# Patient Record
Sex: Female | Born: 1935 | Race: Asian
Health system: Midwestern US, Community
[De-identification: ages and names within clinical notes are randomized; demographics above are authoritative.]

## PROBLEM LIST (undated history)

## (undated) DIAGNOSIS — I77810 Thoracic aortic ectasia: Secondary | ICD-10-CM

## (undated) DIAGNOSIS — E7800 Pure hypercholesterolemia, unspecified: Secondary | ICD-10-CM

## (undated) DIAGNOSIS — I639 Cerebral infarction, unspecified: Secondary | ICD-10-CM

## (undated) DIAGNOSIS — D759 Disease of blood and blood-forming organs, unspecified: Secondary | ICD-10-CM

## (undated) DIAGNOSIS — C9590 Leukemia, unspecified not having achieved remission: Secondary | ICD-10-CM

## (undated) DIAGNOSIS — K2270 Barrett's esophagus without dysplasia: Secondary | ICD-10-CM

## (undated) DIAGNOSIS — Z87440 Personal history of urinary (tract) infections: Secondary | ICD-10-CM

## (undated) DIAGNOSIS — I10 Essential (primary) hypertension: Secondary | ICD-10-CM

## (undated) DIAGNOSIS — K219 Gastro-esophageal reflux disease without esophagitis: Secondary | ICD-10-CM

## (undated) DIAGNOSIS — I714 Abdominal aortic aneurysm, without rupture: Secondary | ICD-10-CM

## (undated) DIAGNOSIS — M1990 Unspecified osteoarthritis, unspecified site: Secondary | ICD-10-CM

## (undated) HISTORY — DX: Cerebral infarction, unspecified: I63.9

## (undated) HISTORY — DX: Gastro-esophageal reflux disease without esophagitis: K21.9

## (undated) HISTORY — DX: Leukemia, unspecified not having achieved remission: C95.90

## (undated) HISTORY — DX: Abdominal aortic aneurysm, without rupture: I71.4

## (undated) HISTORY — DX: Pure hypercholesterolemia, unspecified: E78.00

## (undated) HISTORY — PX: CATARACT EXTRACTION: SUR2

## (undated) HISTORY — DX: Thoracic aortic ectasia: I77.810

## (undated) HISTORY — DX: Unspecified osteoarthritis, unspecified site: M19.90

## (undated) HISTORY — PX: OTHER SURGICAL HISTORY: SHX169

## (undated) HISTORY — DX: Essential (primary) hypertension: I10

## (undated) HISTORY — DX: Barrett's esophagus without dysplasia: K22.70

---

## 2002-02-26 ENCOUNTER — Other Ambulatory Visit: Admission: RE | Admit: 2002-02-26 | Discharge: 2002-02-26 | Payer: Self-pay | Admitting: Obstetrics and Gynecology

## 2002-06-22 ENCOUNTER — Ambulatory Visit (HOSPITAL_COMMUNITY): Admission: RE | Admit: 2002-06-22 | Discharge: 2002-06-23 | Payer: Self-pay | Admitting: Ophthalmology

## 2002-06-22 ENCOUNTER — Encounter: Payer: Self-pay | Admitting: Ophthalmology

## 2002-07-01 ENCOUNTER — Encounter: Admission: RE | Admit: 2002-07-01 | Discharge: 2002-07-01 | Payer: Self-pay | Admitting: Cardiovascular Disease

## 2002-07-01 ENCOUNTER — Encounter: Payer: Self-pay | Admitting: Cardiovascular Disease

## 2002-11-29 ENCOUNTER — Ambulatory Visit (HOSPITAL_COMMUNITY): Admission: RE | Admit: 2002-11-29 | Discharge: 2002-11-29 | Payer: Self-pay | Admitting: Cardiovascular Disease

## 2002-11-29 IMAGING — US US ABDOMEN COMPLETE
1 series · 14 of 25 positions shown · non-contrast
Comparison: none

CLINICAL DATA: 68 year-old with abdominal pain, difficulty swallowing.
 ULTRASOUND ABDOMEN COMPLETE
 There is no evidence of gallstones or gallbladder wall thickening.  There is no evidence of biliary ductal dilatation.  The liver is within normal limits in echogenicity, and no focal parenchymal lesions are identified.  The visualized portion of the pancreas is unremarkable in appearance.
 The kidneys are within normal limits in size and echogenicity and there is no evidence of masses or hydronephrosis.  There is no evidence of splenomegaly, ascites, or abdominal aortic aneurysm.
 IMPRESSION
 Negative abdominal ultrasound.

[Series 1: unknown · 0.35mm/px · 14 of 51 slices shown]
[im 1/51]
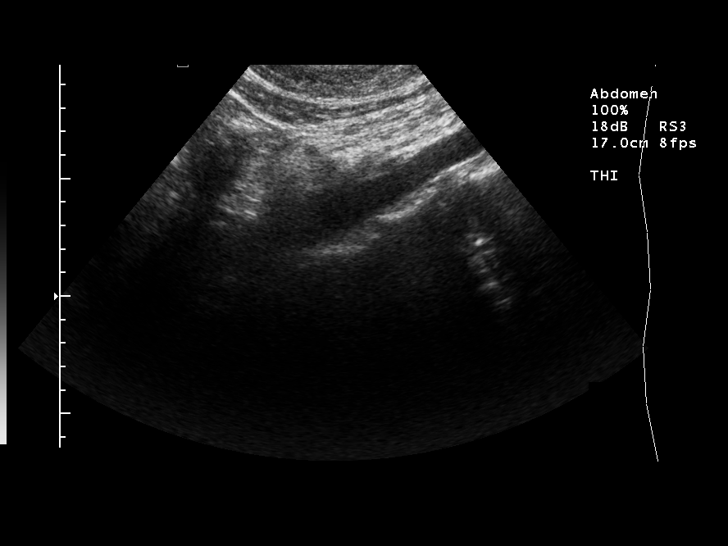
[im 5/51]
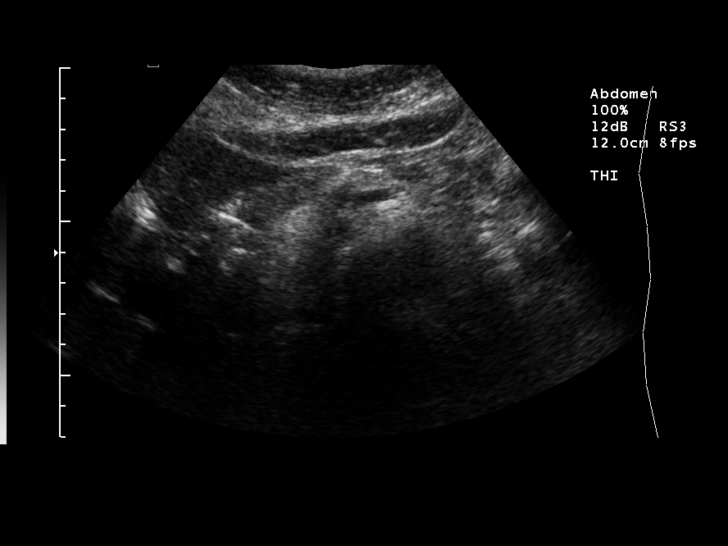
[im 9/51]
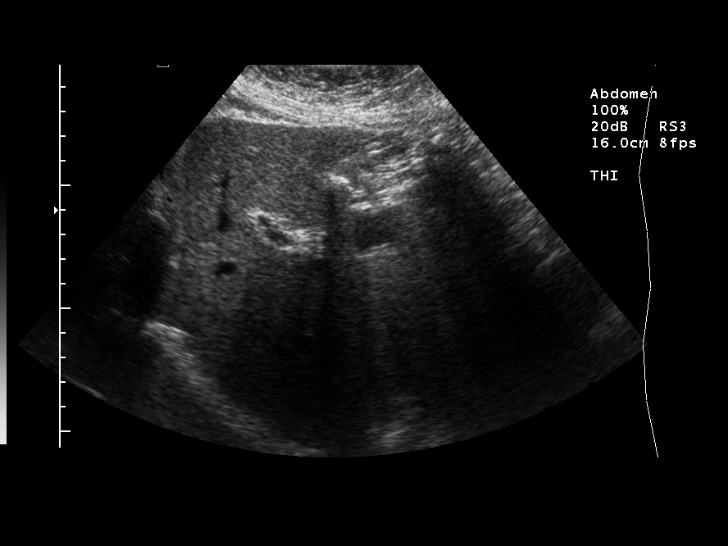
[im 13/51]
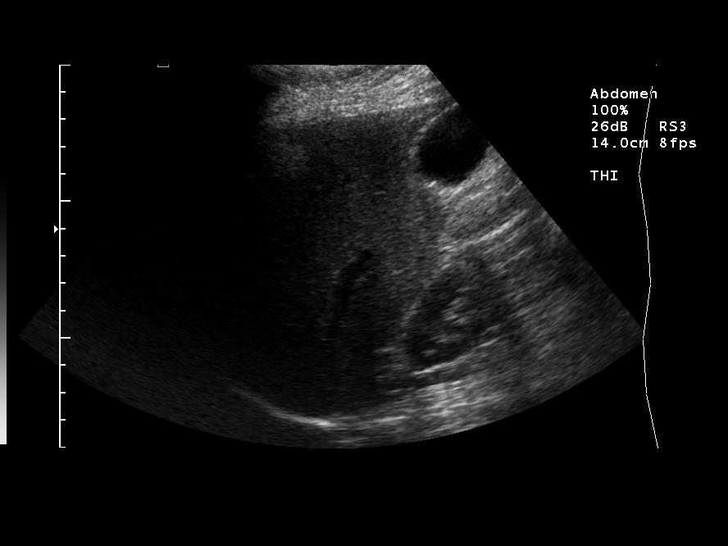
[im 17/51]
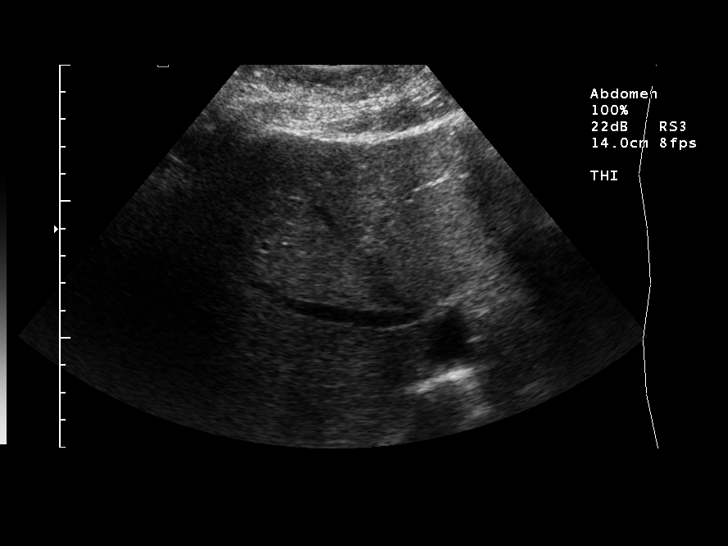
[im 19/51]
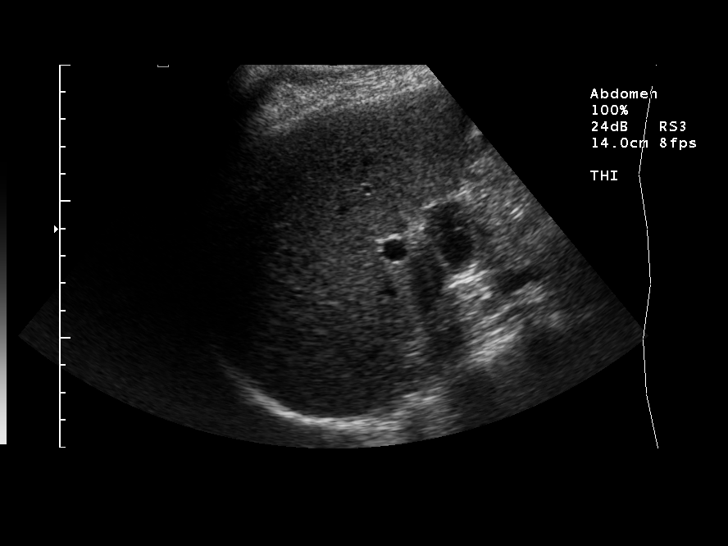
[im 23/51]
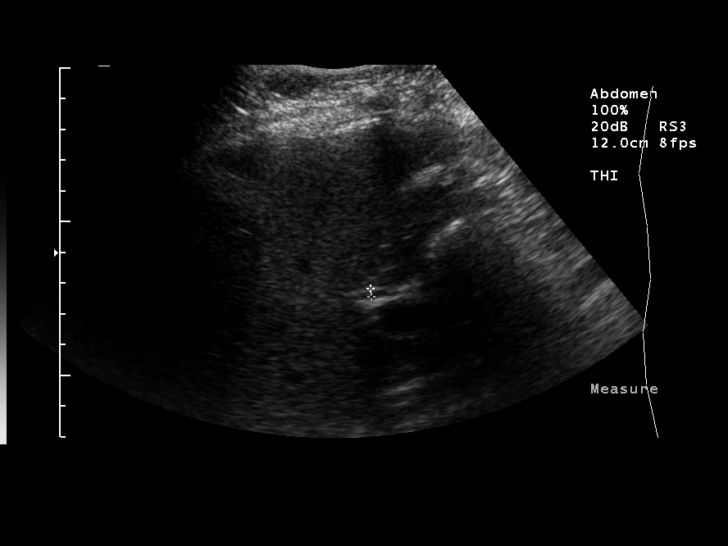
[im 28/51]
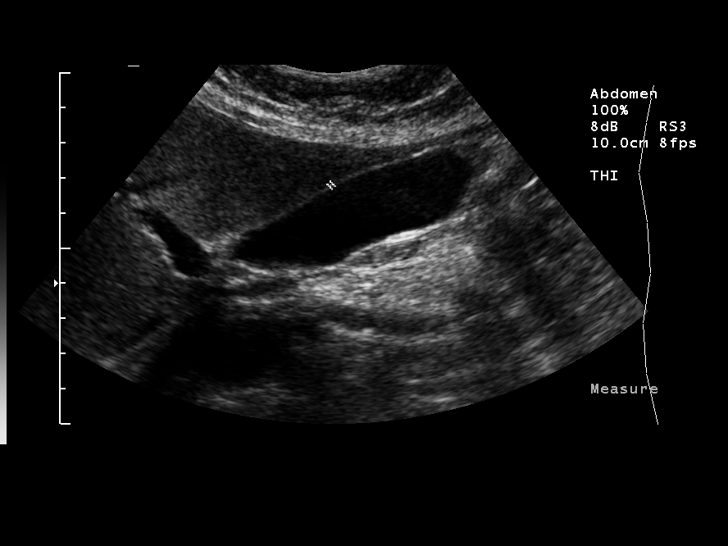
[im 32/51]
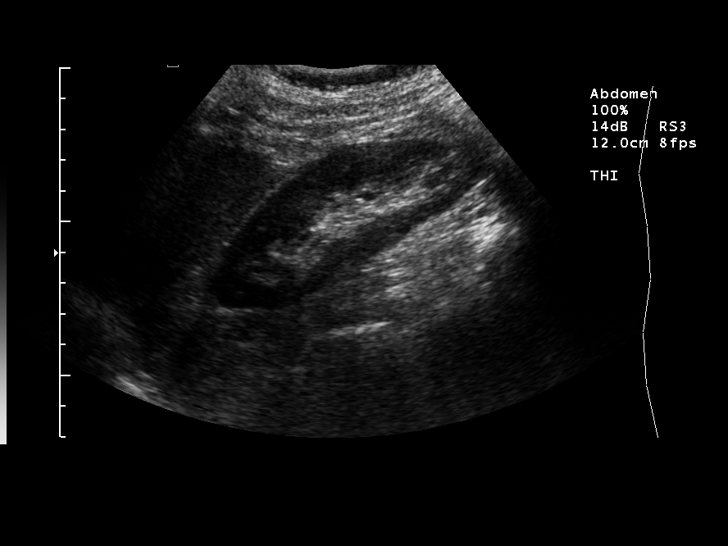
[im 34/51]
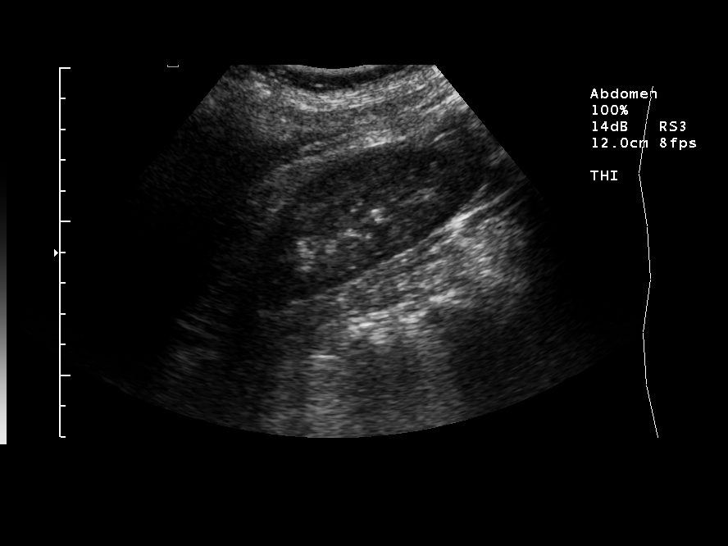
[im 38/51]
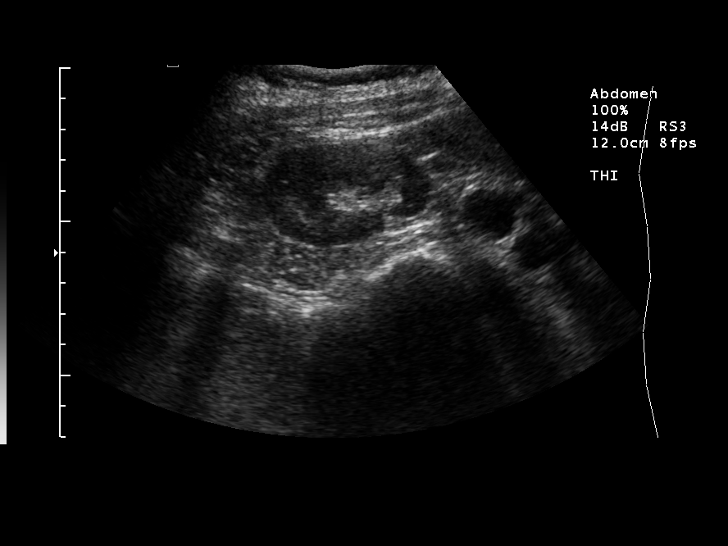
[im 42/51]
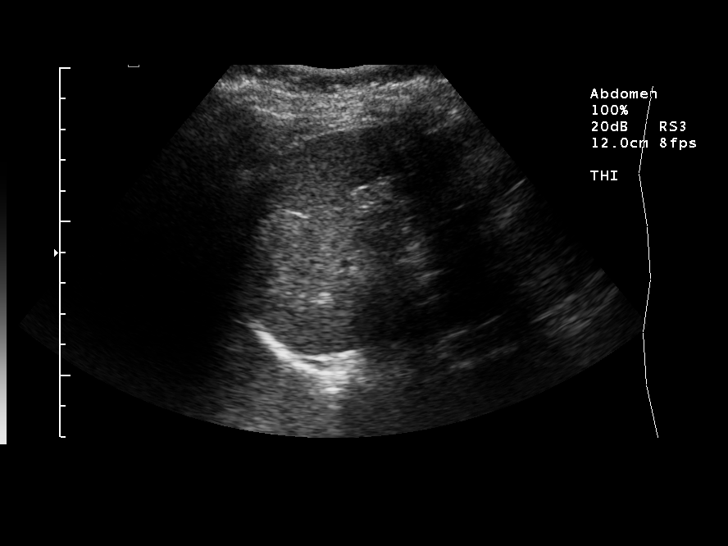
[im 46/51]
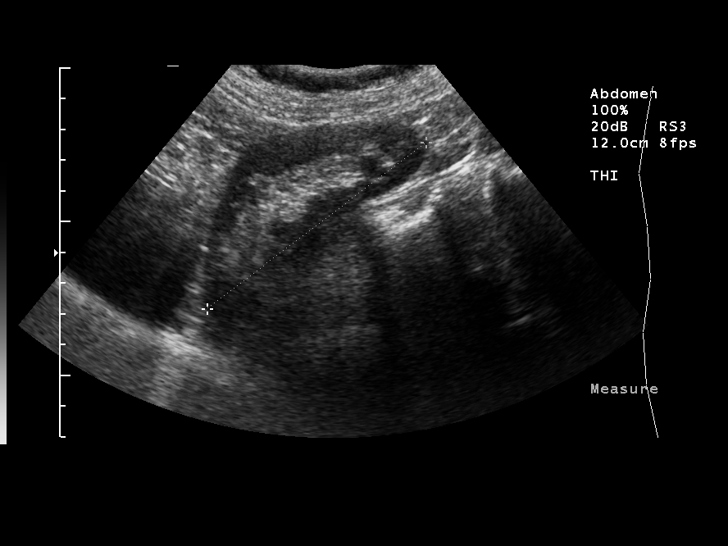
[im 51/51]
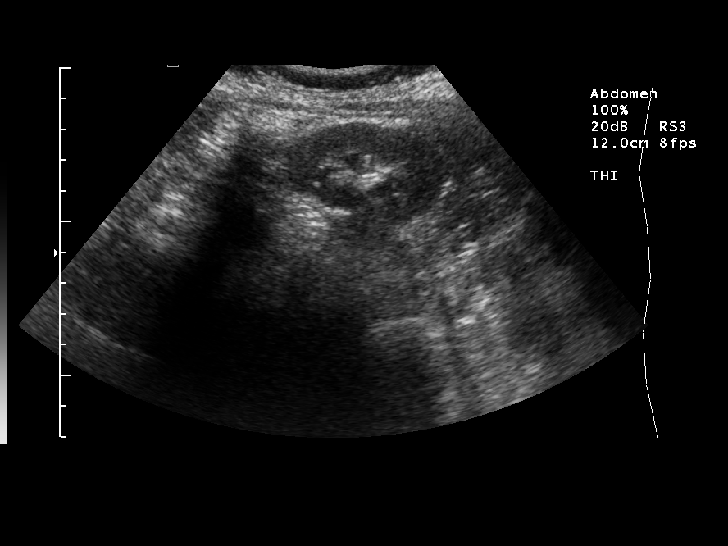

[14 of 25 positions shown; findings below may reference images not displayed]

## 2003-01-11 ENCOUNTER — Ambulatory Visit (HOSPITAL_COMMUNITY): Admission: RE | Admit: 2003-01-11 | Discharge: 2003-01-11 | Payer: Self-pay | Admitting: Obstetrics and Gynecology

## 2003-05-02 ENCOUNTER — Other Ambulatory Visit: Admission: RE | Admit: 2003-05-02 | Discharge: 2003-05-02 | Payer: Self-pay | Admitting: Obstetrics and Gynecology

## 2003-11-30 ENCOUNTER — Encounter: Admission: RE | Admit: 2003-11-30 | Discharge: 2003-11-30 | Payer: Self-pay | Admitting: Cardiovascular Disease

## 2003-11-30 IMAGING — CR DG KNEE 1-2V*R*
2 series · 2 of 2 positions shown · non-contrast
Comparison: none

CLINICAL DATA: Bilateral knee pain for several years, greater on the left.  No known injury.  Left knee effusions drained in the past.  
 TWO VIEW RIGHT KNEE: 
 AP and lateral views of the right knee demonstrate moderate medial joint space narrowing and associated spur formation.  Also noted is mild lateral spur formation and mild to moderate posterior patellar spur formation.  A vacuum phenomenon is seen within the medial joint space.  No effusion is seen.  Diffuse osteopenia is noted.

[view not recorded (1 of 2)]
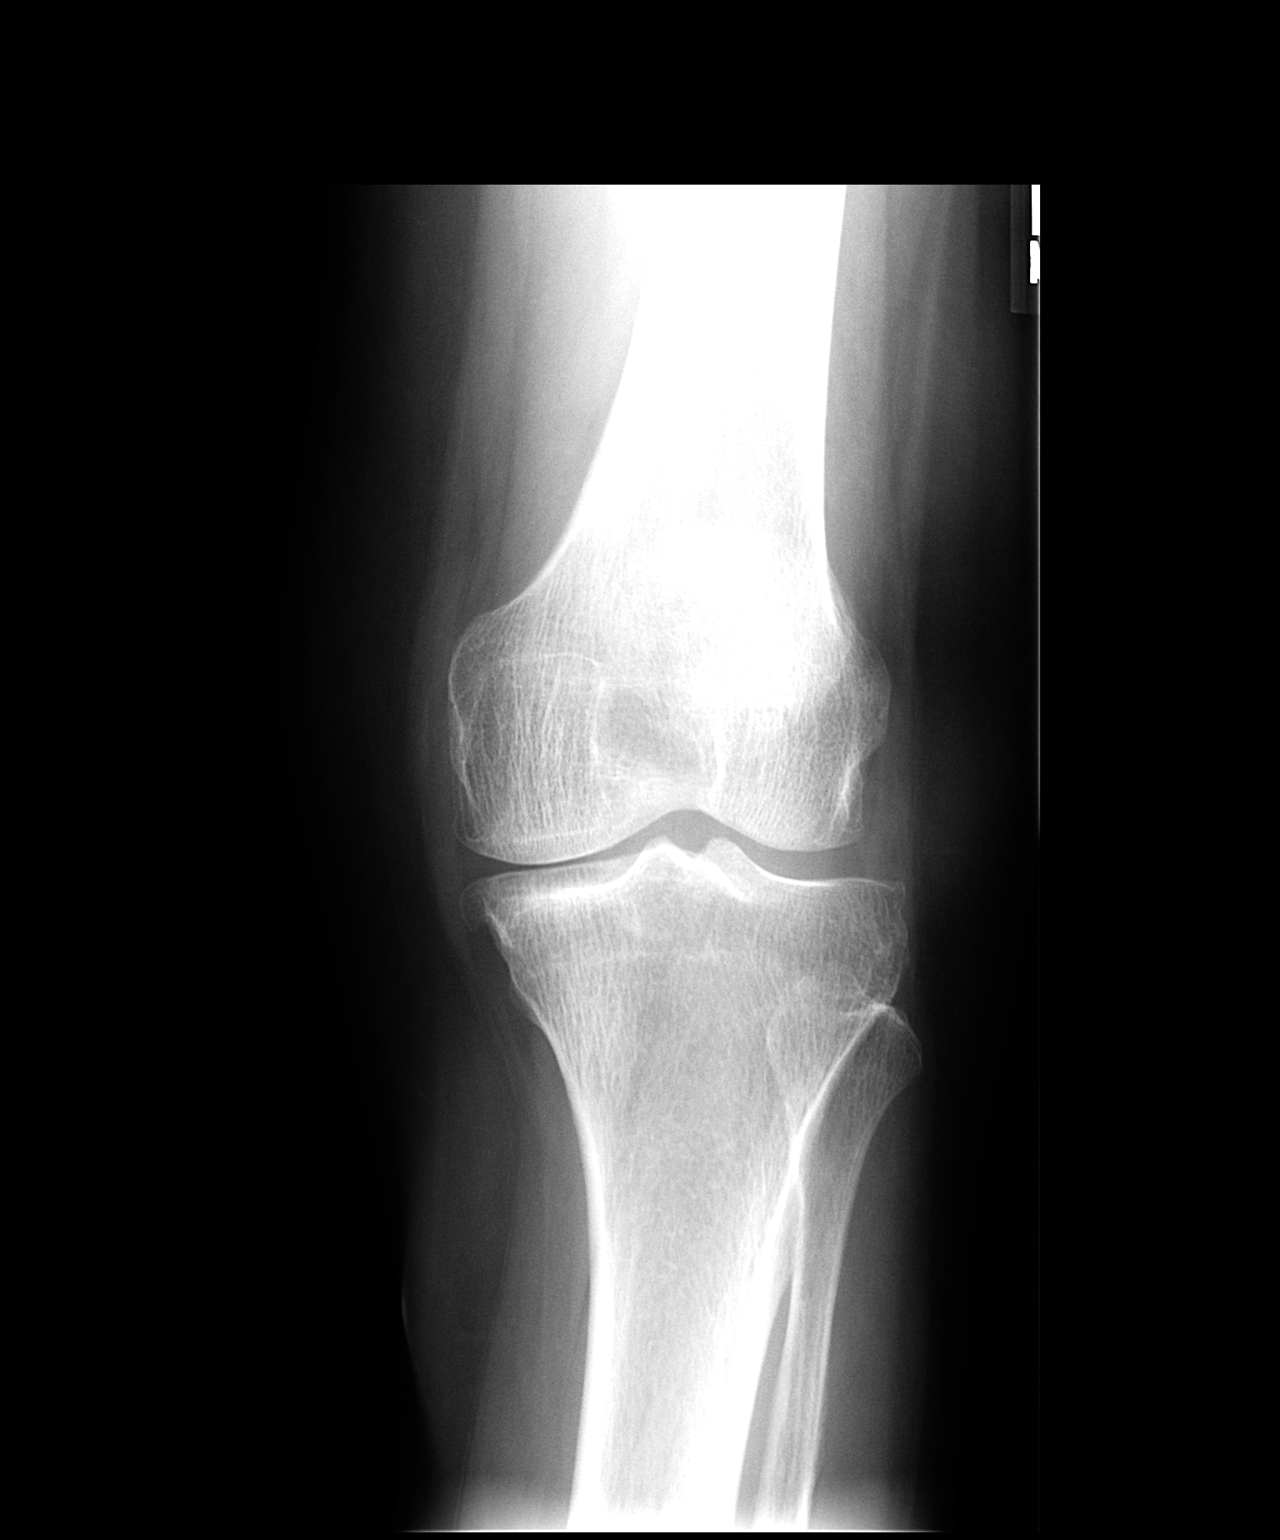

[view not recorded (2 of 2)]
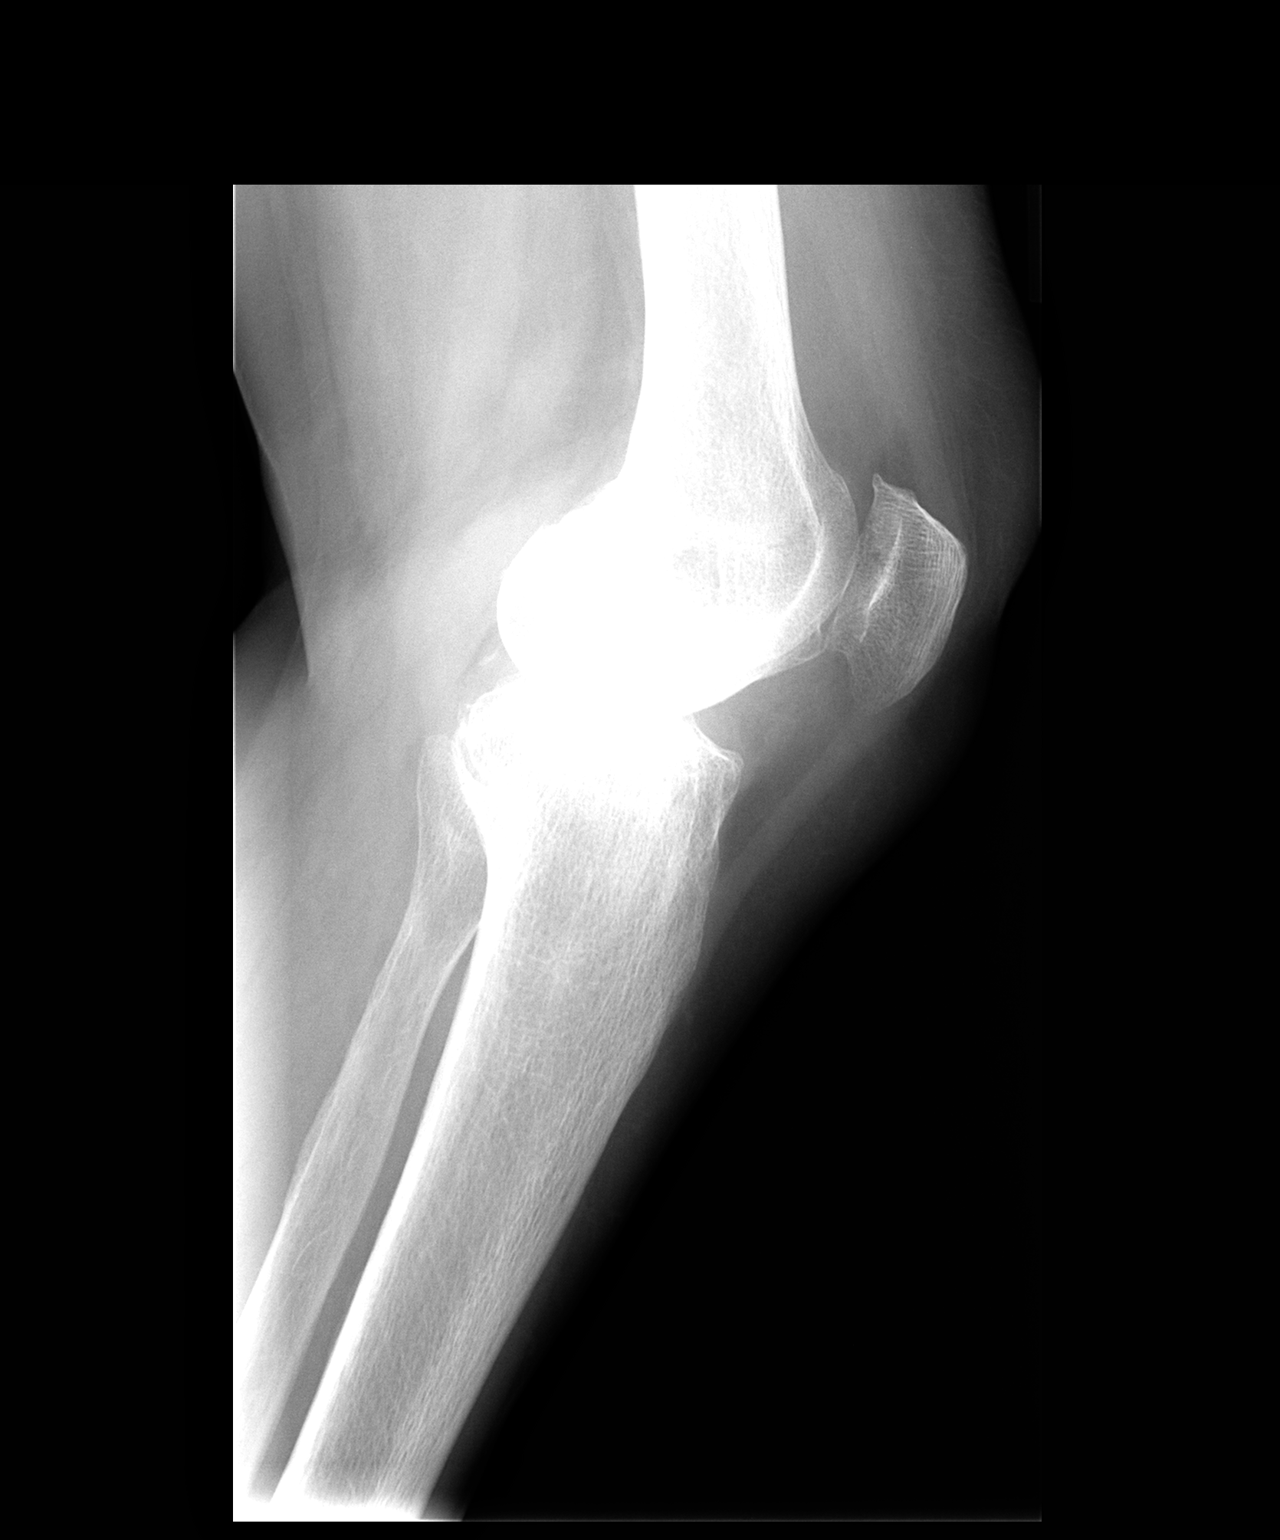

[2 of 2 positions shown; findings below may reference images not displayed]

IMPRESSION: Degenerative changes, as described above. 
 TWO VIEW LEFT KNEE: 
 AP and lateral views of the left knee demonstrate mild medial joint space narrowing, moderate medial spur formation, mild lateral spur formation and mild posterior patellar spur formation.  No effusion is seen.  Diffuse osteopenia is noted.
IMPRESSION: Degenerative changes, as described above.

## 2003-11-30 IMAGING — CR DG KNEE 1-2V*R*
2 series · 2 of 2 positions shown · non-contrast
Comparison: none

CLINICAL DATA: Bilateral knee pain for several years, greater on the left.  No known injury.  Left knee effusions drained in the past.  
 TWO VIEW RIGHT KNEE: 
 AP and lateral views of the right knee demonstrate moderate medial joint space narrowing and associated spur formation.  Also noted is mild lateral spur formation and mild to moderate posterior patellar spur formation.  A vacuum phenomenon is seen within the medial joint space.  No effusion is seen.  Diffuse osteopenia is noted.

[view not recorded (1 of 2)]
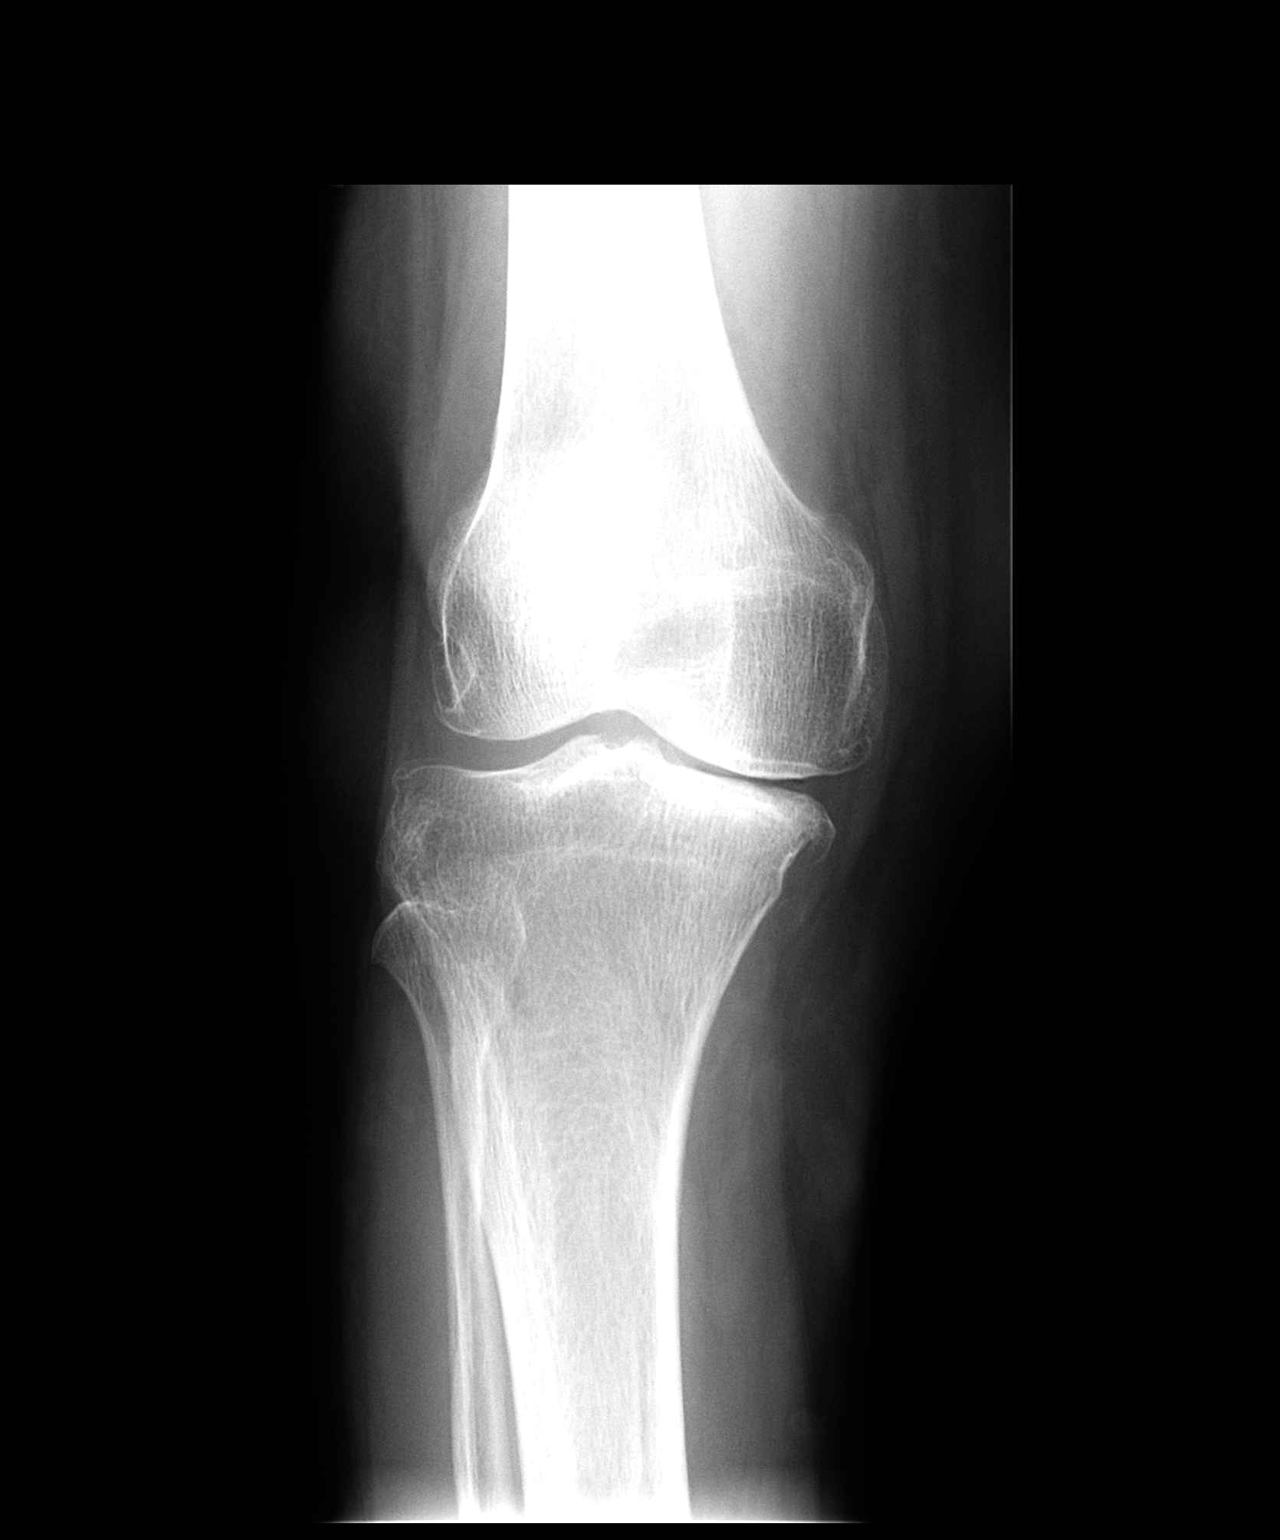

[view not recorded (2 of 2)]
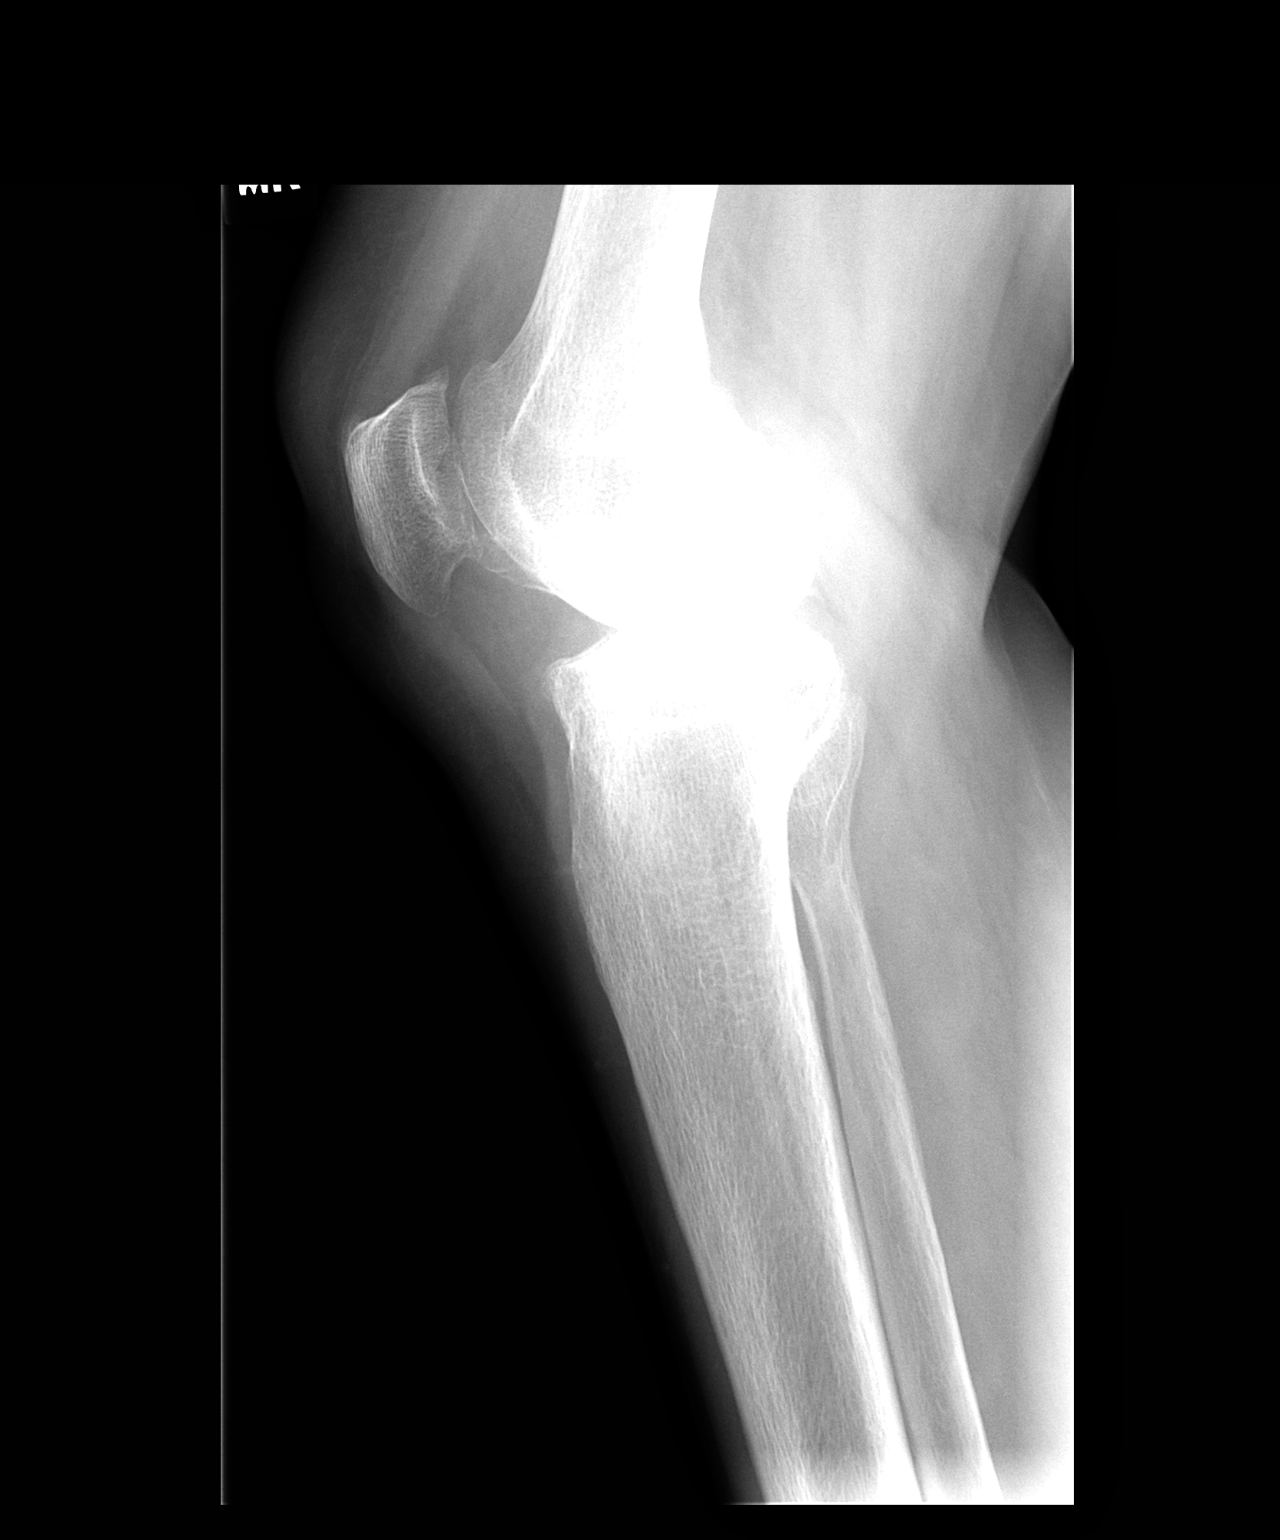

[2 of 2 positions shown; findings below may reference images not displayed]

IMPRESSION: Degenerative changes, as described above. 
 TWO VIEW LEFT KNEE: 
 AP and lateral views of the left knee demonstrate mild medial joint space narrowing, moderate medial spur formation, mild lateral spur formation and mild posterior patellar spur formation.  No effusion is seen.  Diffuse osteopenia is noted.
IMPRESSION: Degenerative changes, as described above.

## 2003-12-06 ENCOUNTER — Encounter: Admission: RE | Admit: 2003-12-06 | Discharge: 2003-12-06 | Payer: Self-pay | Admitting: Cardiovascular Disease

## 2003-12-06 IMAGING — CT CT HEAD WO/W CM
1 of 2 series · 13 of 30 positions shown, 17 images · IV contrast (omnipaque)
Comparison: none

CLINICAL DATA: Dizziness.  Possible stroke.  
 HEAD CT WITH AND WITHOUT CONTRAST:
 5 mm scans were made before and after intravenous administration of 75 cc of Omnipaque 300.

[Series 2: head w/o · axial · non-contrast · 0.43mm/px · z∈[-20,+103]mm · 13 of 28 slices shown, 17 images]
[im 2/28  brain]
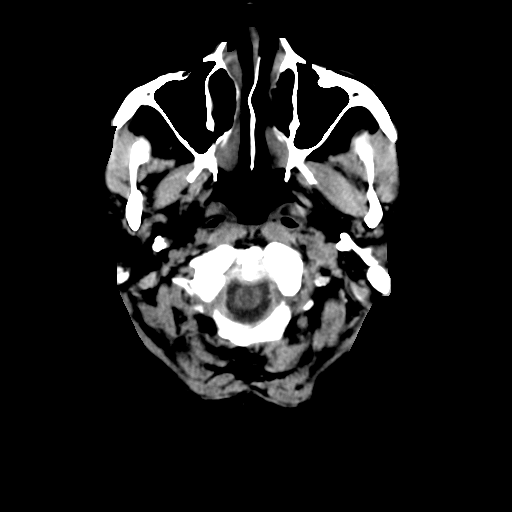
[im 2/28  bone]
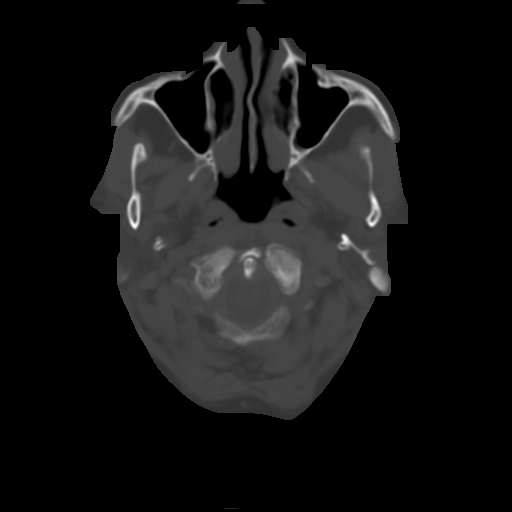
[im 4/28  brain]
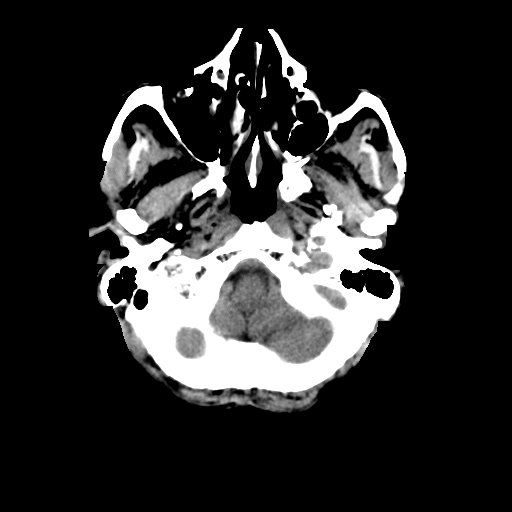
[im 6/28  brain]
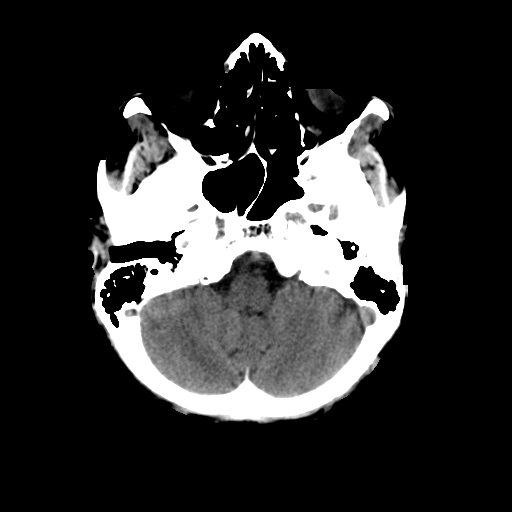
[im 8/28  brain]
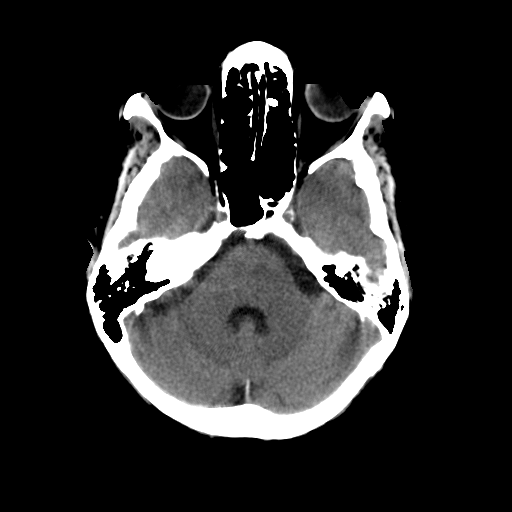
[im 10/28  brain]
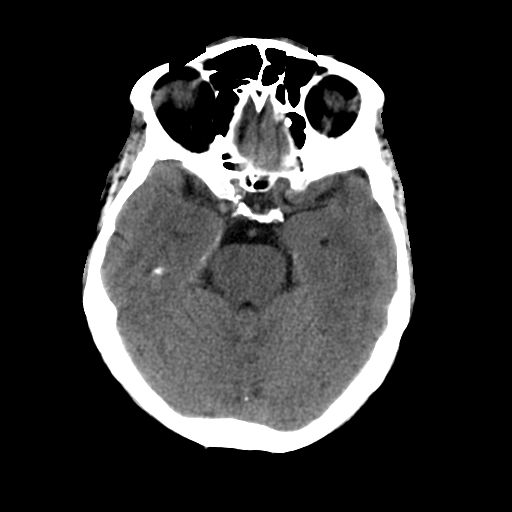
[im 10/28  bone]
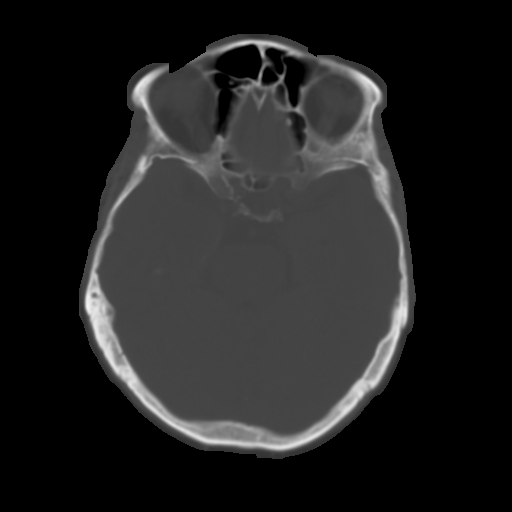
[im 12/28  brain]
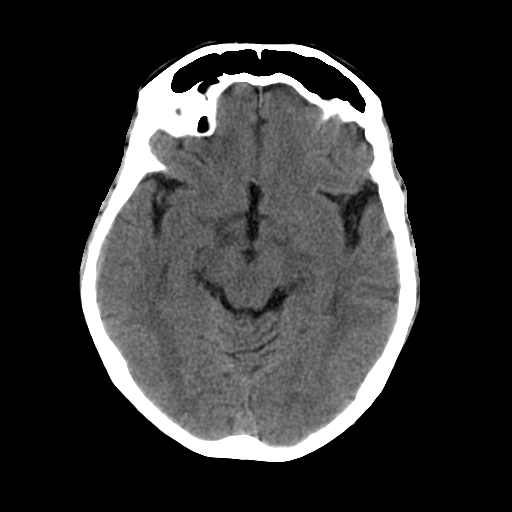
[im 14/28  brain]
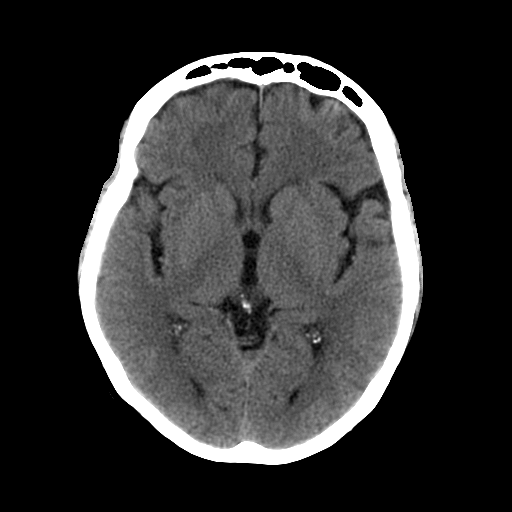
[im 16/28  brain]
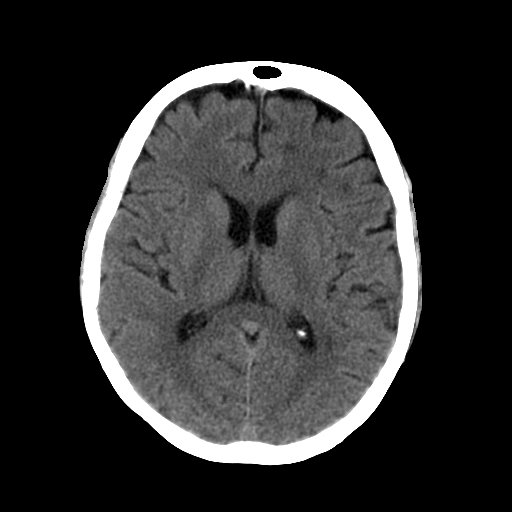
[im 18/28  brain]
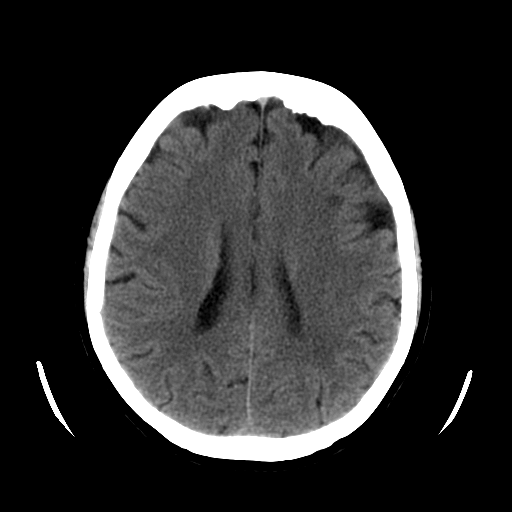
[im 18/28  bone]
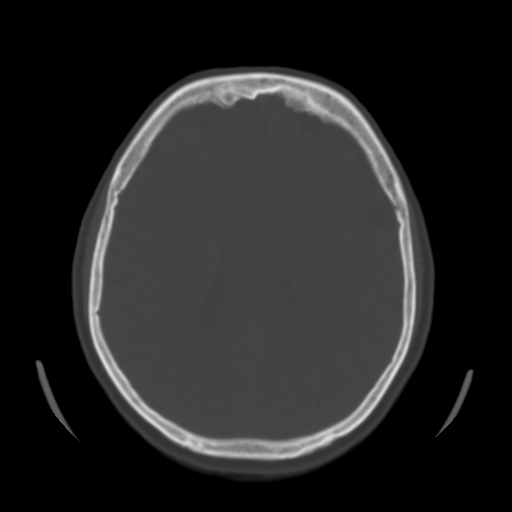
[im 20/28  brain]
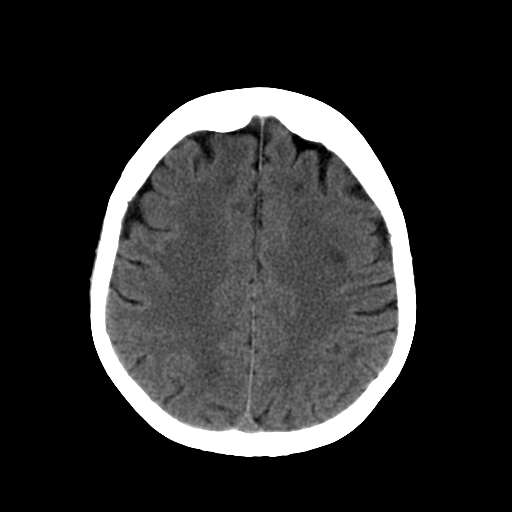
[im 22/28  brain]
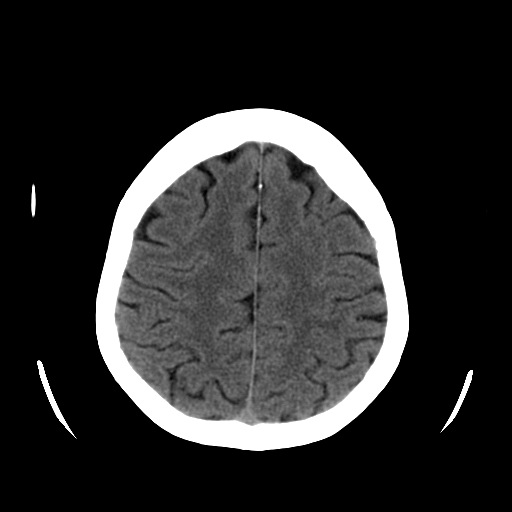
[im 24/28  brain]
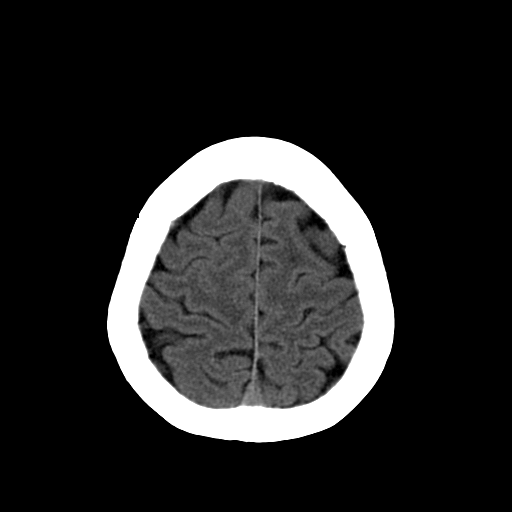
[im 26/28  brain]
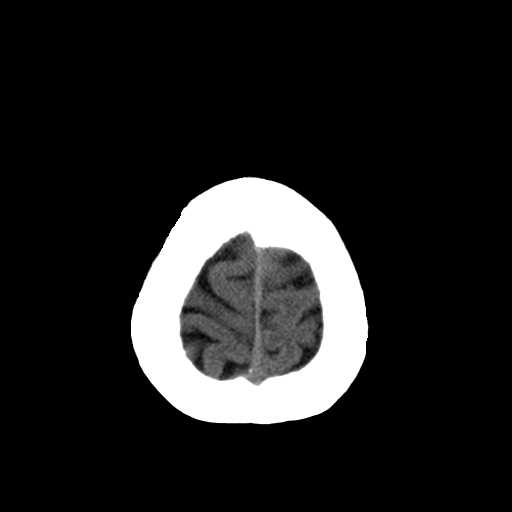
[im 26/28  bone]
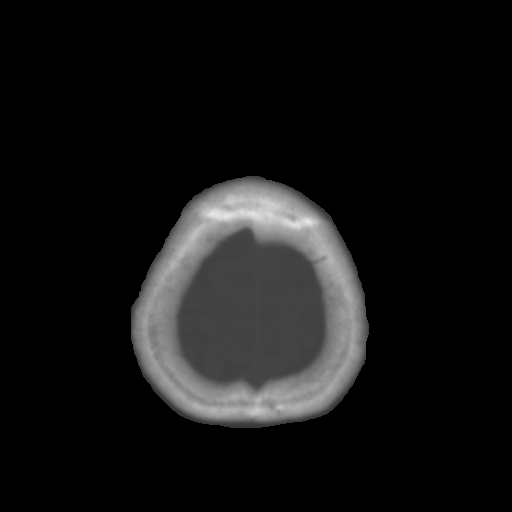

[13 of 30 positions shown; findings below may reference images not displayed]

The brain does not show any generalized atrophic change.  There are some scattered foci of low density in the deep and subcortical white matter of both hemispheres consistent with chronic small vessel change.  There is no evidence of cortical or large vessel stroke.  No sign of mass, hemorrhage, hydrocephalus or extra-axial collection.  After contrast administration, there is no abnormal enhancement.  The calvarium is unremarkable.  The paranasal sinuses, middle ears and mastoids are clear.
IMPRESSION: Scattered areas of low density in the hemispheric white matter consistent with chronic small vessel disease.  No identifiably acute or reversible process.

## 2004-02-03 ENCOUNTER — Encounter (INDEPENDENT_AMBULATORY_CARE_PROVIDER_SITE_OTHER): Payer: Self-pay | Admitting: *Deleted

## 2004-02-03 ENCOUNTER — Ambulatory Visit (HOSPITAL_COMMUNITY): Admission: RE | Admit: 2004-02-03 | Discharge: 2004-02-03 | Payer: Self-pay | Admitting: Gastroenterology

## 2004-09-11 ENCOUNTER — Ambulatory Visit (HOSPITAL_COMMUNITY): Admission: RE | Admit: 2004-09-11 | Discharge: 2004-09-11 | Payer: Self-pay | Admitting: Gastroenterology

## 2004-09-11 IMAGING — CT CT ABDOMEN W/ CM
2 of 5 series · 17 of 46 positions shown, 19 images · IV contrast (OMNI 350 & [ID] OMNIO 300)
Comparison: None.

CLINICAL DATA: Abdominal pain and vomiting.  History of leukemia. 
 ABDOMEN CT WITH CONTRAST:
TECHNIQUE: Multidetector CT imaging of the abdomen was performed following the standard protocol during bolus administration of intravenous contrast.
 Contrast:  100 cc Omnipaque 300
TECHNIQUE: Multidetector CT imaging of the pelvis was performed following the standard protocol during bolus administration of intravenous contrast.
 The uterus and adnexal structures are unremarkable.  There is no free pelvic fluid.  The bladder is normal in appearance.  The pelvic bowel loops are negative.  Negative for lymphadenopathy.  No inguinal lymphadenopathy.  Dystrophic calcifications are identified within the subcutaneous fat of the buttocks.

[Series 2: routine abdomen · axial · 0.76mm/px · z∈[-458,-78]mm · 14 of 86 slices shown, 16 images]
[im 5/86  soft-tissue]
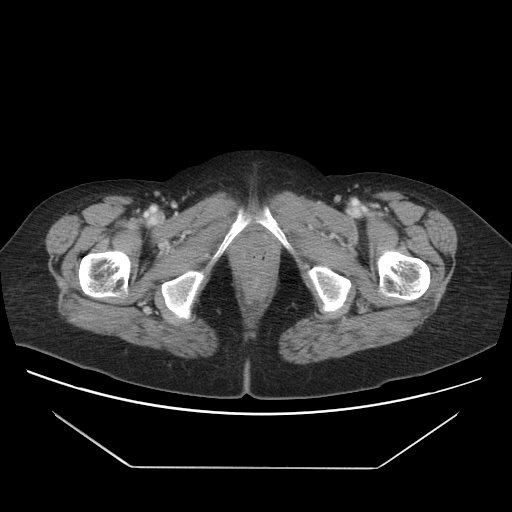
[im 5/86  bone]
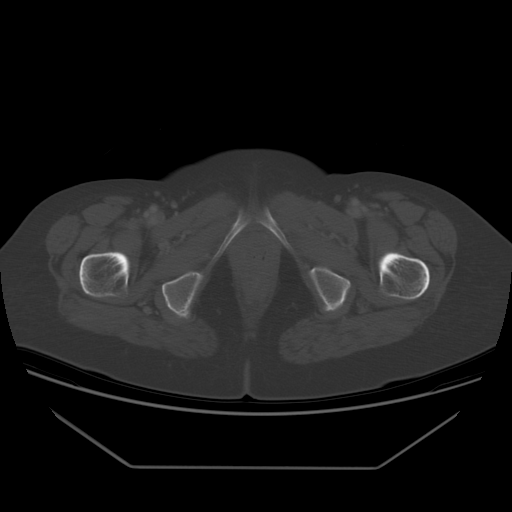
[im 13/86  soft-tissue]
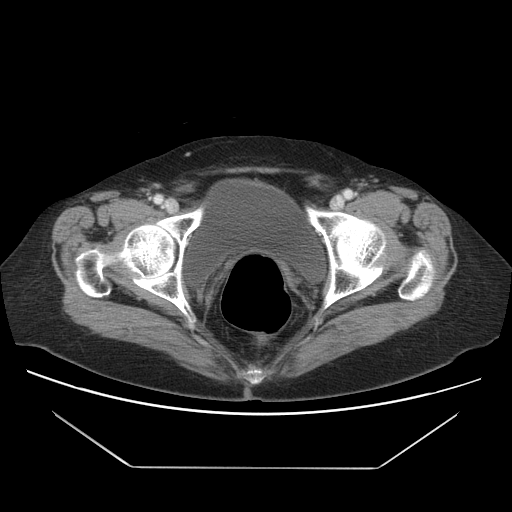
[im 17/86  soft-tissue]
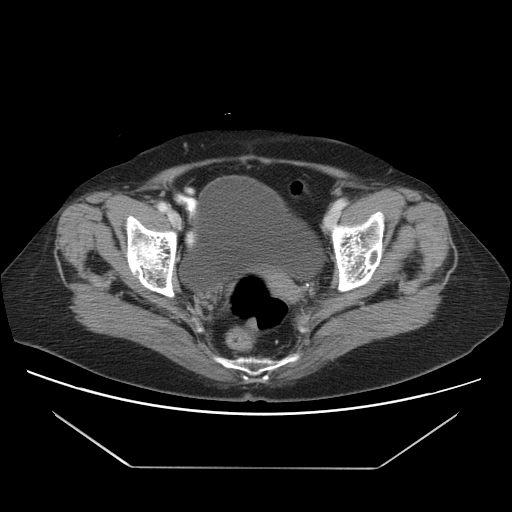
[im 25/86  soft-tissue]
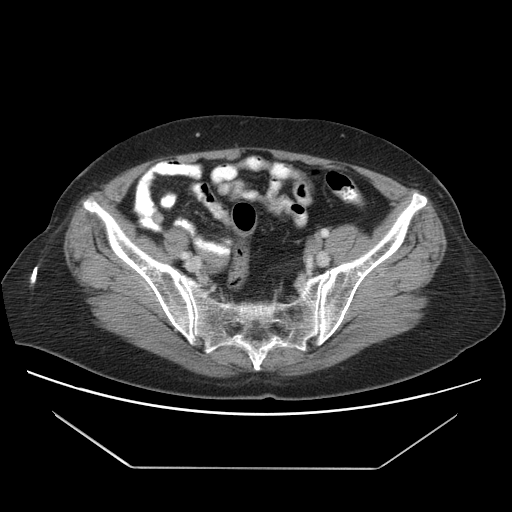
[im 29/86  soft-tissue]
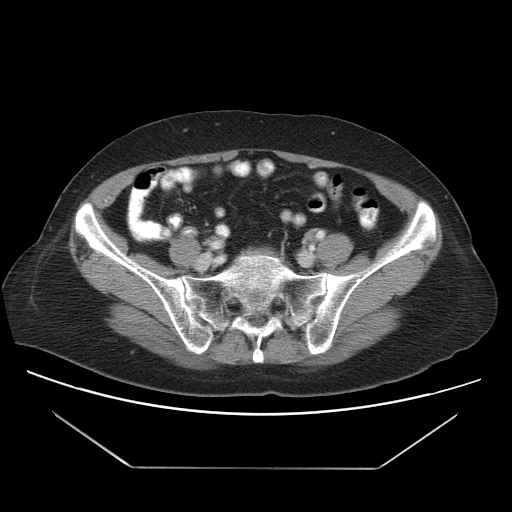
[im 33/86  soft-tissue]
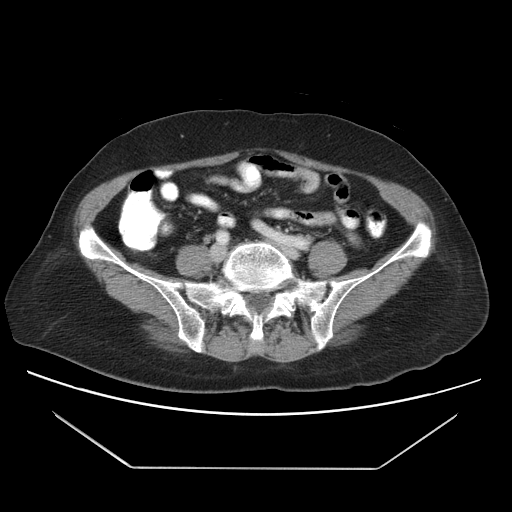
[im 41/86  soft-tissue]
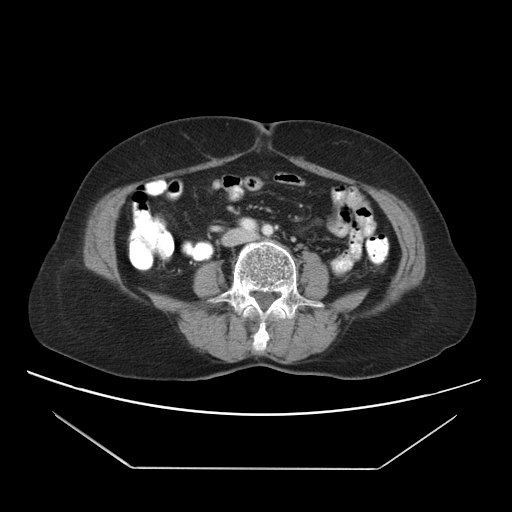
[im 45/86  soft-tissue]
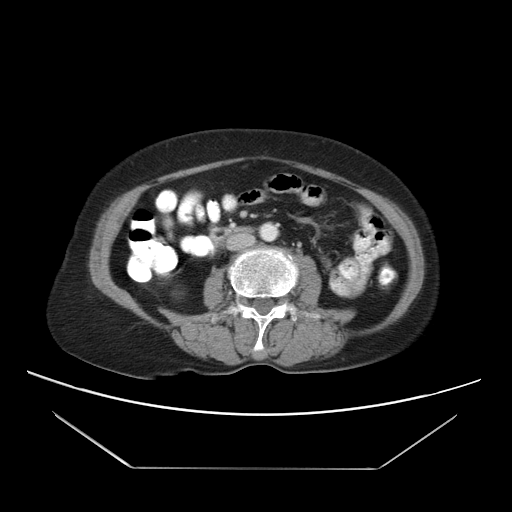
[im 53/86  soft-tissue]
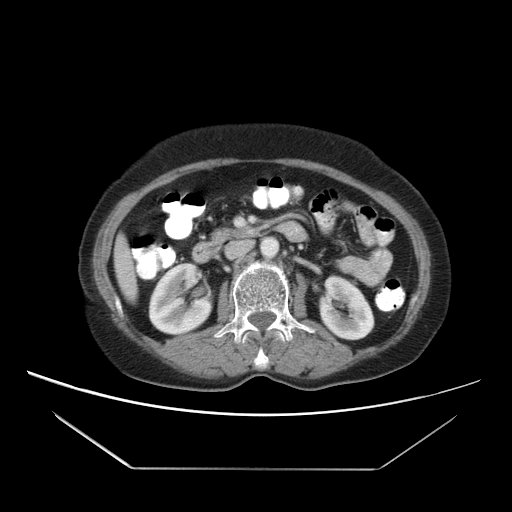
[im 53/86  bone]
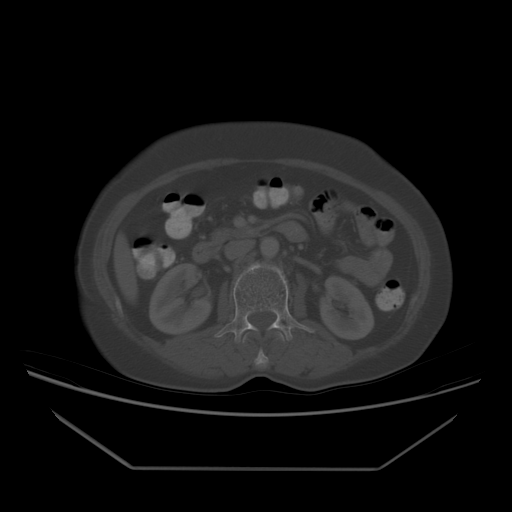
[im 57/86  soft-tissue]
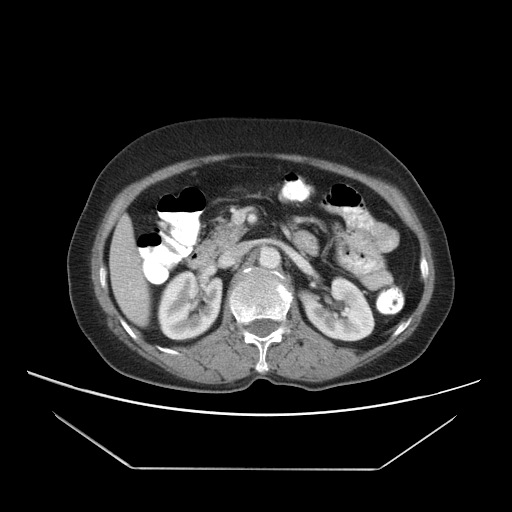
[im 65/86  soft-tissue]
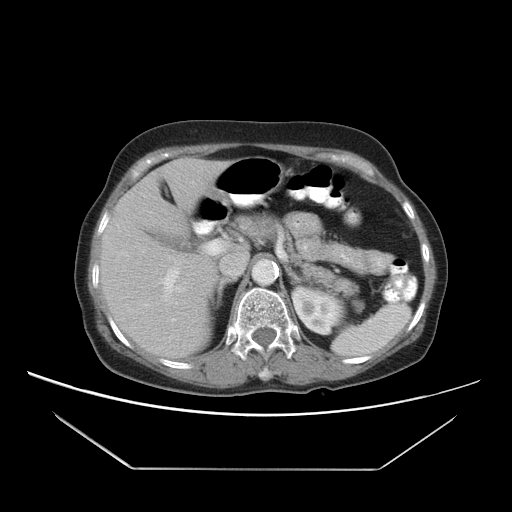
[im 69/86  soft-tissue]
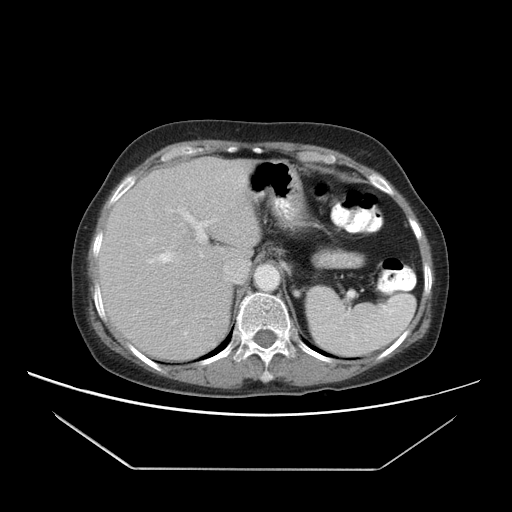
[im 73/86  soft-tissue]
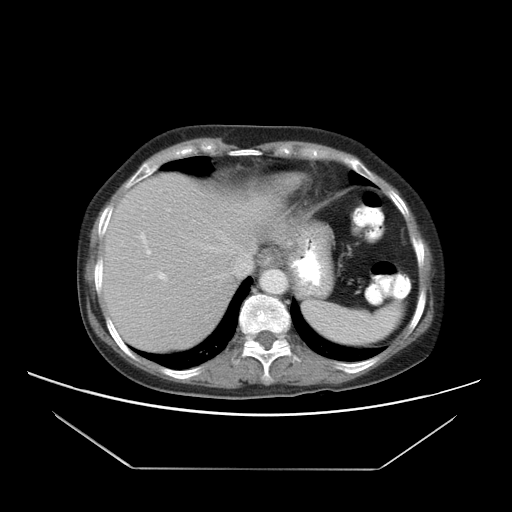
[im 81/86  soft-tissue]
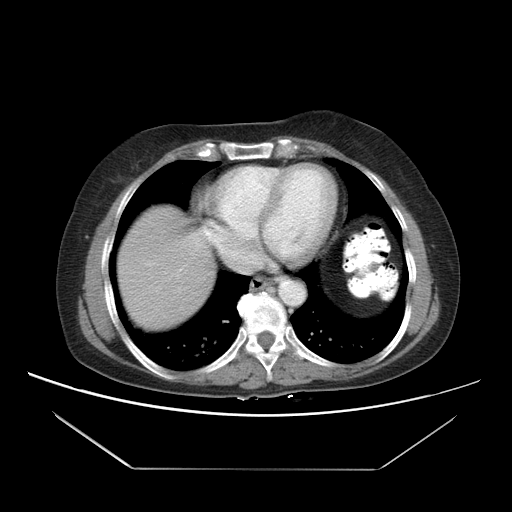

[Series 104: reformatted · coronal · 0.87mm/px · 3 of 100 slices shown]
[im 34/100  soft-tissue]
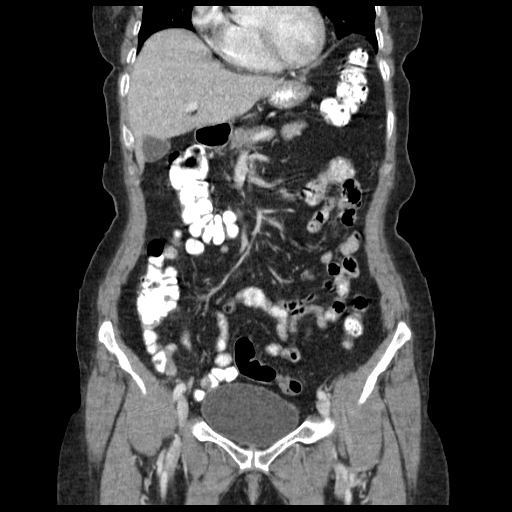
[im 45/100  soft-tissue]
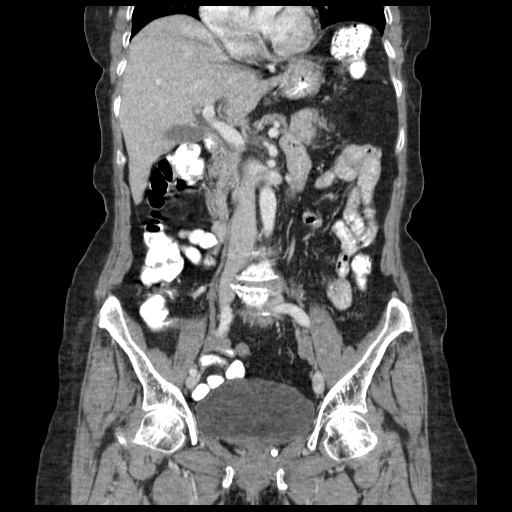
[im 56/100  soft-tissue]
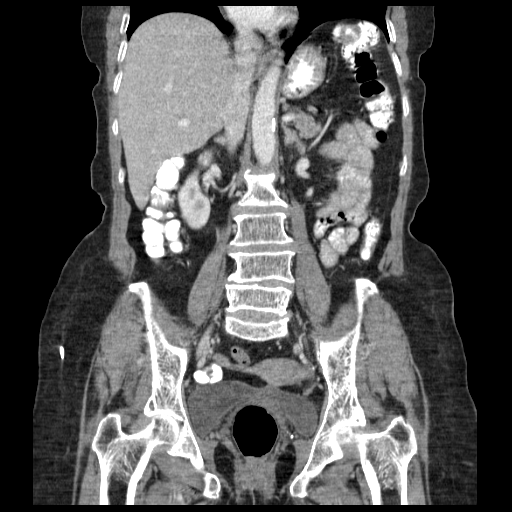

[17 of 46 positions shown; findings below may reference images not displayed]

FINDINGS: There are dependent changes identified at both lung bases.  There are no pleural or pericardial effusions.  
 The liver is normal in attenuation and morphology.  The gallbladder is negative.  There is no intrahepatic biliary ductal dilatation. 
 Spleen negative. 
 The adrenal glands are negative. 
 The pancreas is normal. 
 The left kidney is negative. 
 The right kidney is negative. 
 Negative for mesenteric or retroperitoneal lymphadenopathy.  There is no free fluid.   
 The appendix is visualized and the entire lumen fills with contrast.  However, there is apparent mild thickening of the appendiceal wall in that the appendix measures up to 12.1 mm in diameter measured from outer wall to outer wall.  Normally, the diameter of the appendix should be 6 mm.  Although no secondary signs of acute appendicitis are noted, specifically no periappendiceal inflammation or abscess, I cannot rule out an early acute appendicitis.
IMPRESSION: 1.  No acute abdomen CT findings. 
 2.  Cannot rule out early acute appendicitis. 
 PELVIS CT WITH CONTRAST:
IMPRESSION: No acute pelvic CT findings.

## 2005-10-24 ENCOUNTER — Encounter: Admission: RE | Admit: 2005-10-24 | Discharge: 2005-10-24 | Payer: Self-pay | Admitting: Obstetrics and Gynecology

## 2005-11-15 ENCOUNTER — Other Ambulatory Visit: Admission: RE | Admit: 2005-11-15 | Discharge: 2005-11-15 | Payer: Self-pay | Admitting: Obstetrics and Gynecology

## 2006-07-03 ENCOUNTER — Encounter: Admission: RE | Admit: 2006-07-03 | Discharge: 2006-07-03 | Payer: Self-pay | Admitting: Cardiovascular Disease

## 2006-07-03 IMAGING — CR DG CHEST 2V
2 series · 2 of 2 positions shown · non-contrast
Comparison: [REDACTED] Chest x-ray report, [DATE] and [REDACTED] Abdominal CT, [DATE].

CLINICAL DATA: Cough.  Fever. 
DIAGNOSTIC CHEST - 2 VIEW:

[w chest pa]
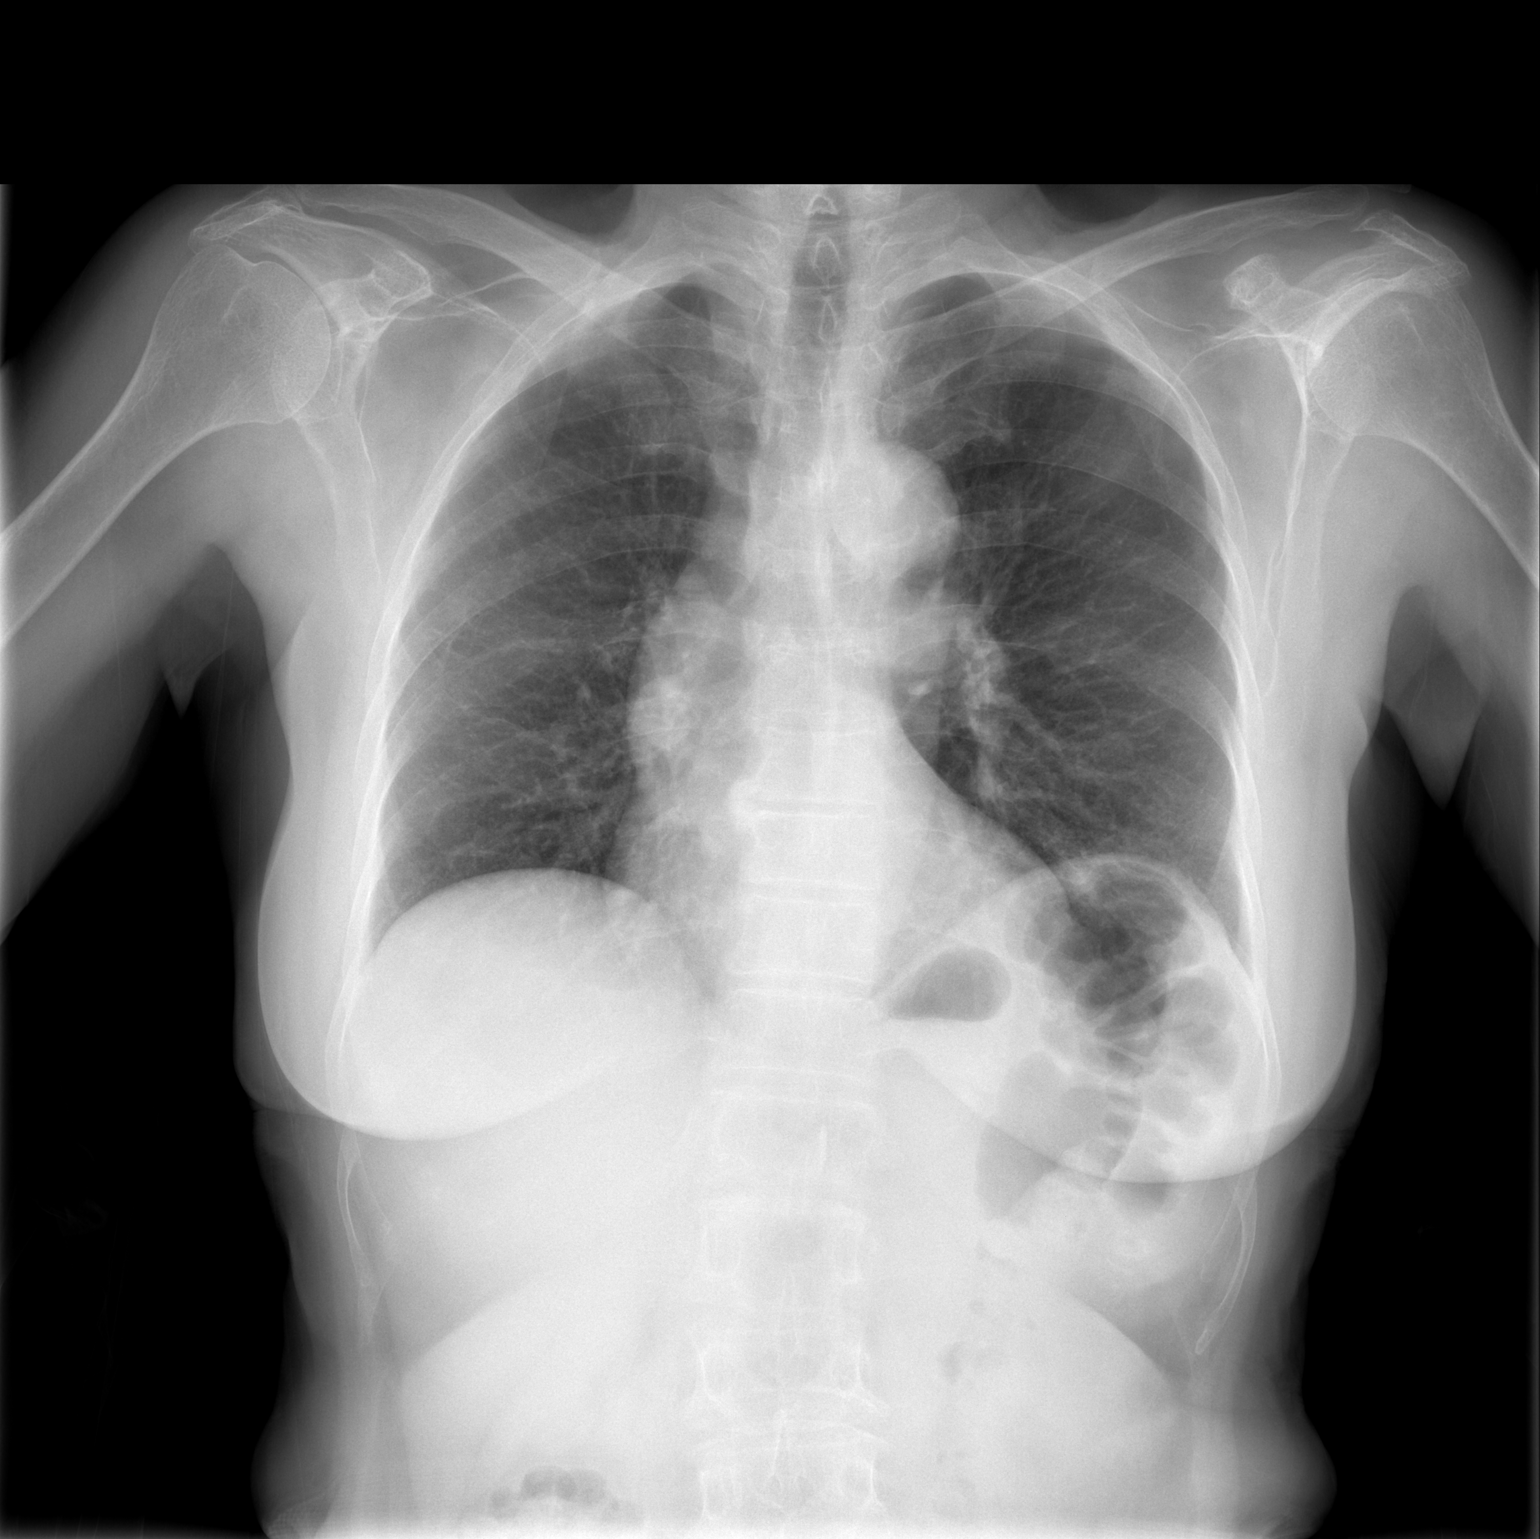

[w chest lat]
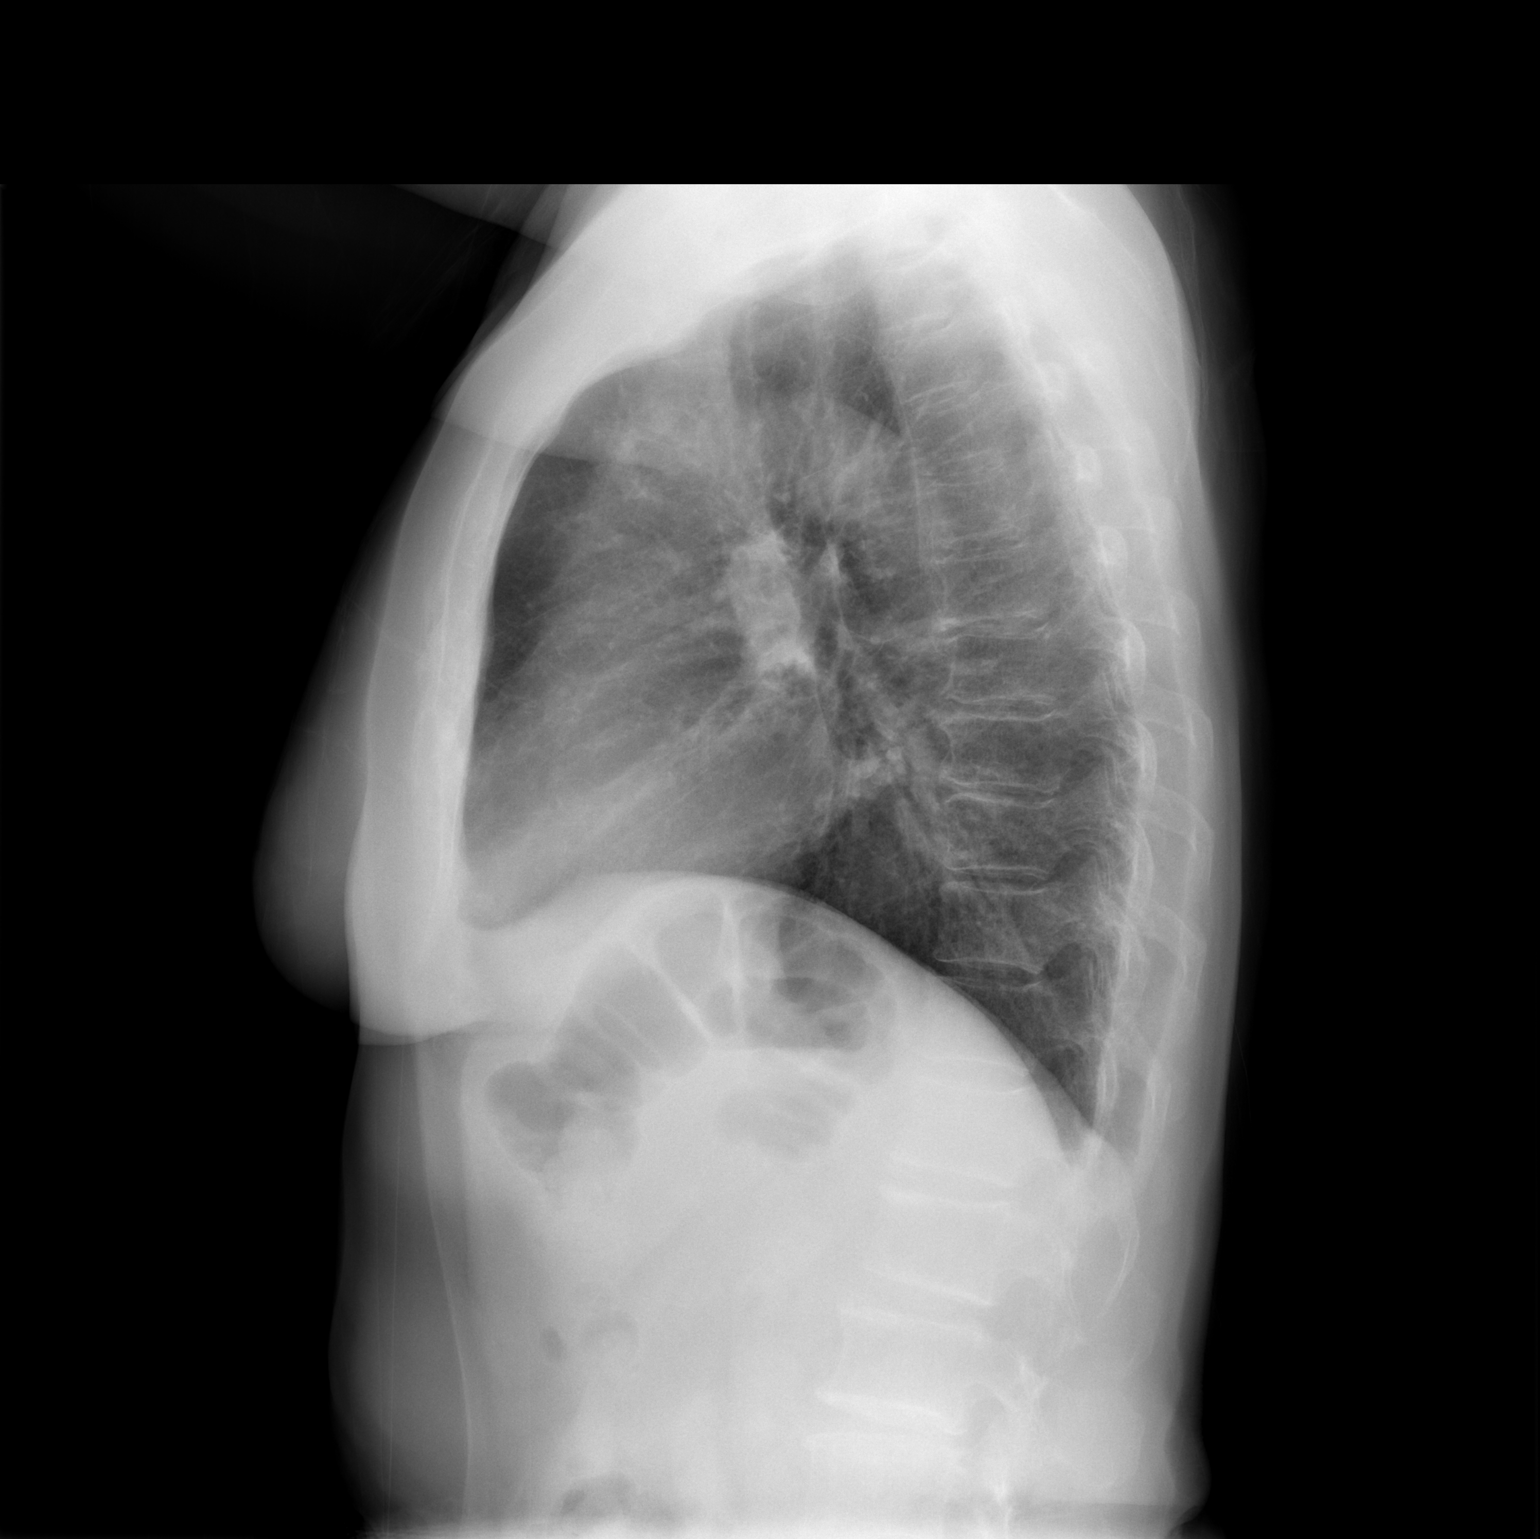

[2 of 2 positions shown; findings below may reference images not displayed]

FINDINGS: Submaximal inspiration is seen with generalized prominence of the bronchopulmonary markings of slight asthma or bronchitis with no pneumonia seen.  At the inferior right hilum, is probable superimposed ovoid (approximate 2cm) right infrahilar vasculature.  Recommend repeat PA with bilateral shallow oblique chest x-ray or chest CT for further evaluation.  The lungs are otherwise clear.  Heart size is upper limits of normal.  Thoracic aorta is slightly calcified and tortuous.  The mediastinum, hila, and pleura are otherwise unremarkable.  Bilateral chronic appearing rotator cuff arthropathy changes are seen with slight dorsal osteopenia and scoliosis thoracolumbar spine.
IMPRESSION: 1.  Slight changes of asthma or bronchitis with no pneumonia. 
2.  Approximate 2 cm right infrahilar opacity favoring summation vascular shadow yet recommend repeat PA with bilateral shallow oblique chest x-rays or chest CT for further confirmation. 
3.  Otherwise no acute findings.

## 2006-11-25 ENCOUNTER — Encounter: Admission: RE | Admit: 2006-11-25 | Discharge: 2006-11-25 | Payer: Self-pay | Admitting: Obstetrics and Gynecology

## 2006-12-12 ENCOUNTER — Emergency Department (HOSPITAL_COMMUNITY): Admission: EM | Admit: 2006-12-12 | Discharge: 2006-12-12 | Payer: Self-pay | Admitting: Sports Medicine

## 2006-12-12 IMAGING — CR DG CHEST 2V
2 series · 2 of 2 positions shown · non-contrast
Comparison: [DATE]

CLINICAL DATA: Fever and cough.
 CHEST ? 2 VIEW:

[view not recorded (1 of 2)]
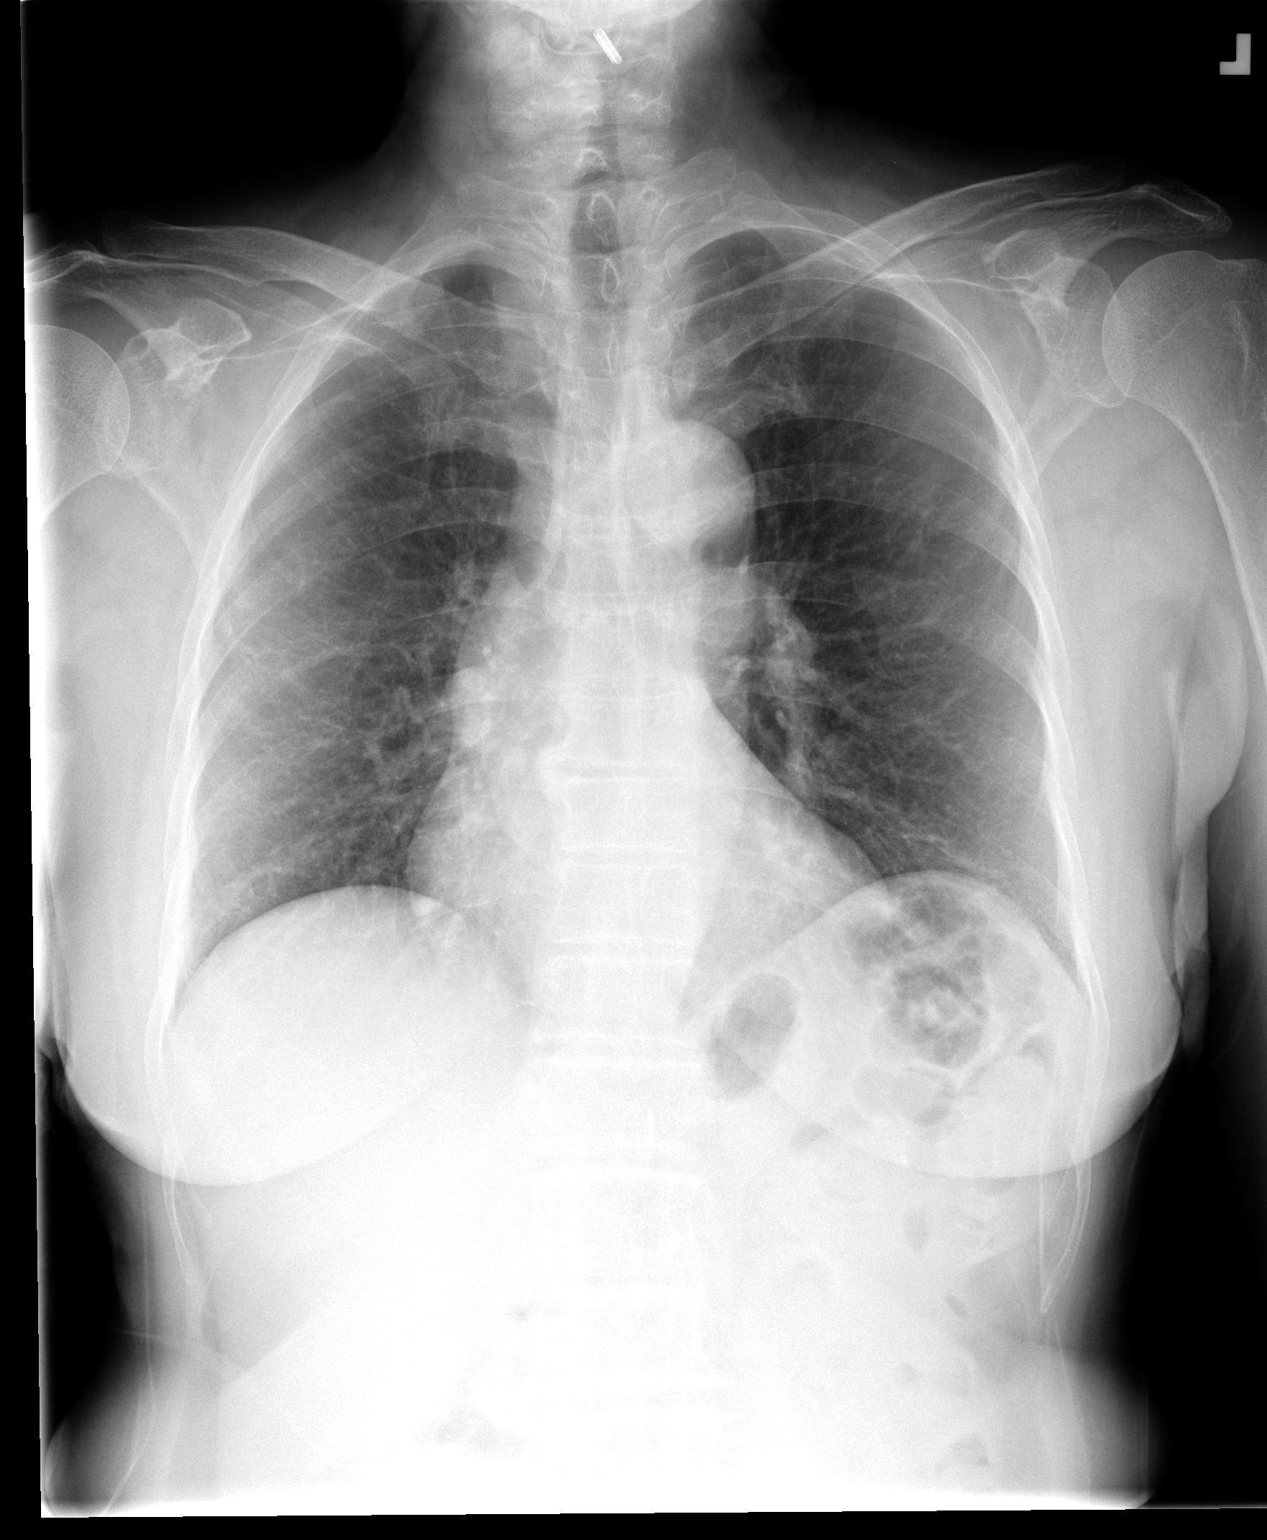

[view not recorded (2 of 2)]
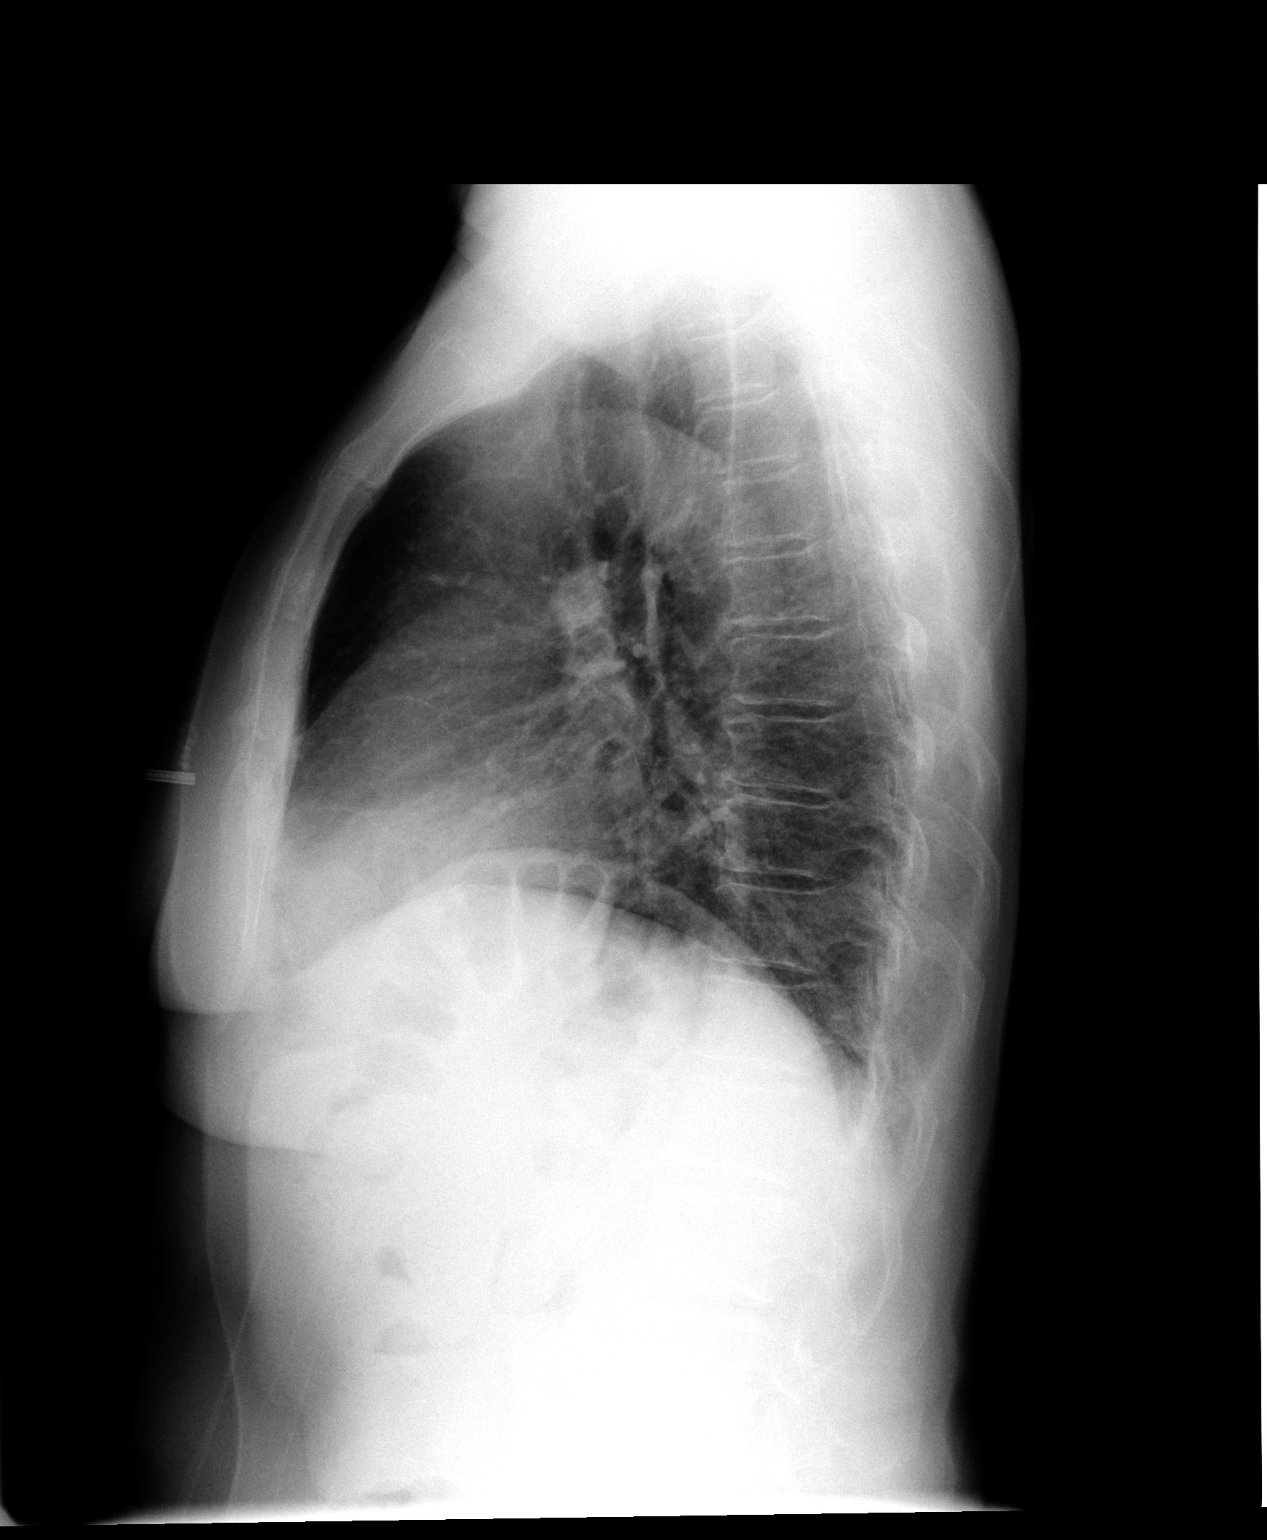

[2 of 2 positions shown; findings below may reference images not displayed]

FINDINGS: Normal mediastinum and cardiac silhouette. Costophrenic angles are clear.  There is mild prominence of the interstitial markings, unchanged from prior.   No evidence of focal infiltrate.  No pleural effusion.
IMPRESSION: No      focal infiltrate. 
  Coarse      interstitial markings similar to prior may represent chronic bronchitis.

## 2006-12-16 ENCOUNTER — Encounter: Admission: RE | Admit: 2006-12-16 | Discharge: 2006-12-16 | Payer: Self-pay | Admitting: Obstetrics and Gynecology

## 2006-12-23 ENCOUNTER — Encounter: Admission: RE | Admit: 2006-12-23 | Discharge: 2006-12-23 | Payer: Self-pay | Admitting: Cardiovascular Disease

## 2006-12-23 IMAGING — CR DG CHEST 2V
2 series · 2 of 2 positions shown · non-contrast
Comparison: [DATE].

CLINICAL DATA: Cough.   Chest pain.   
 CHEST X-RAY:

[w chest pa]
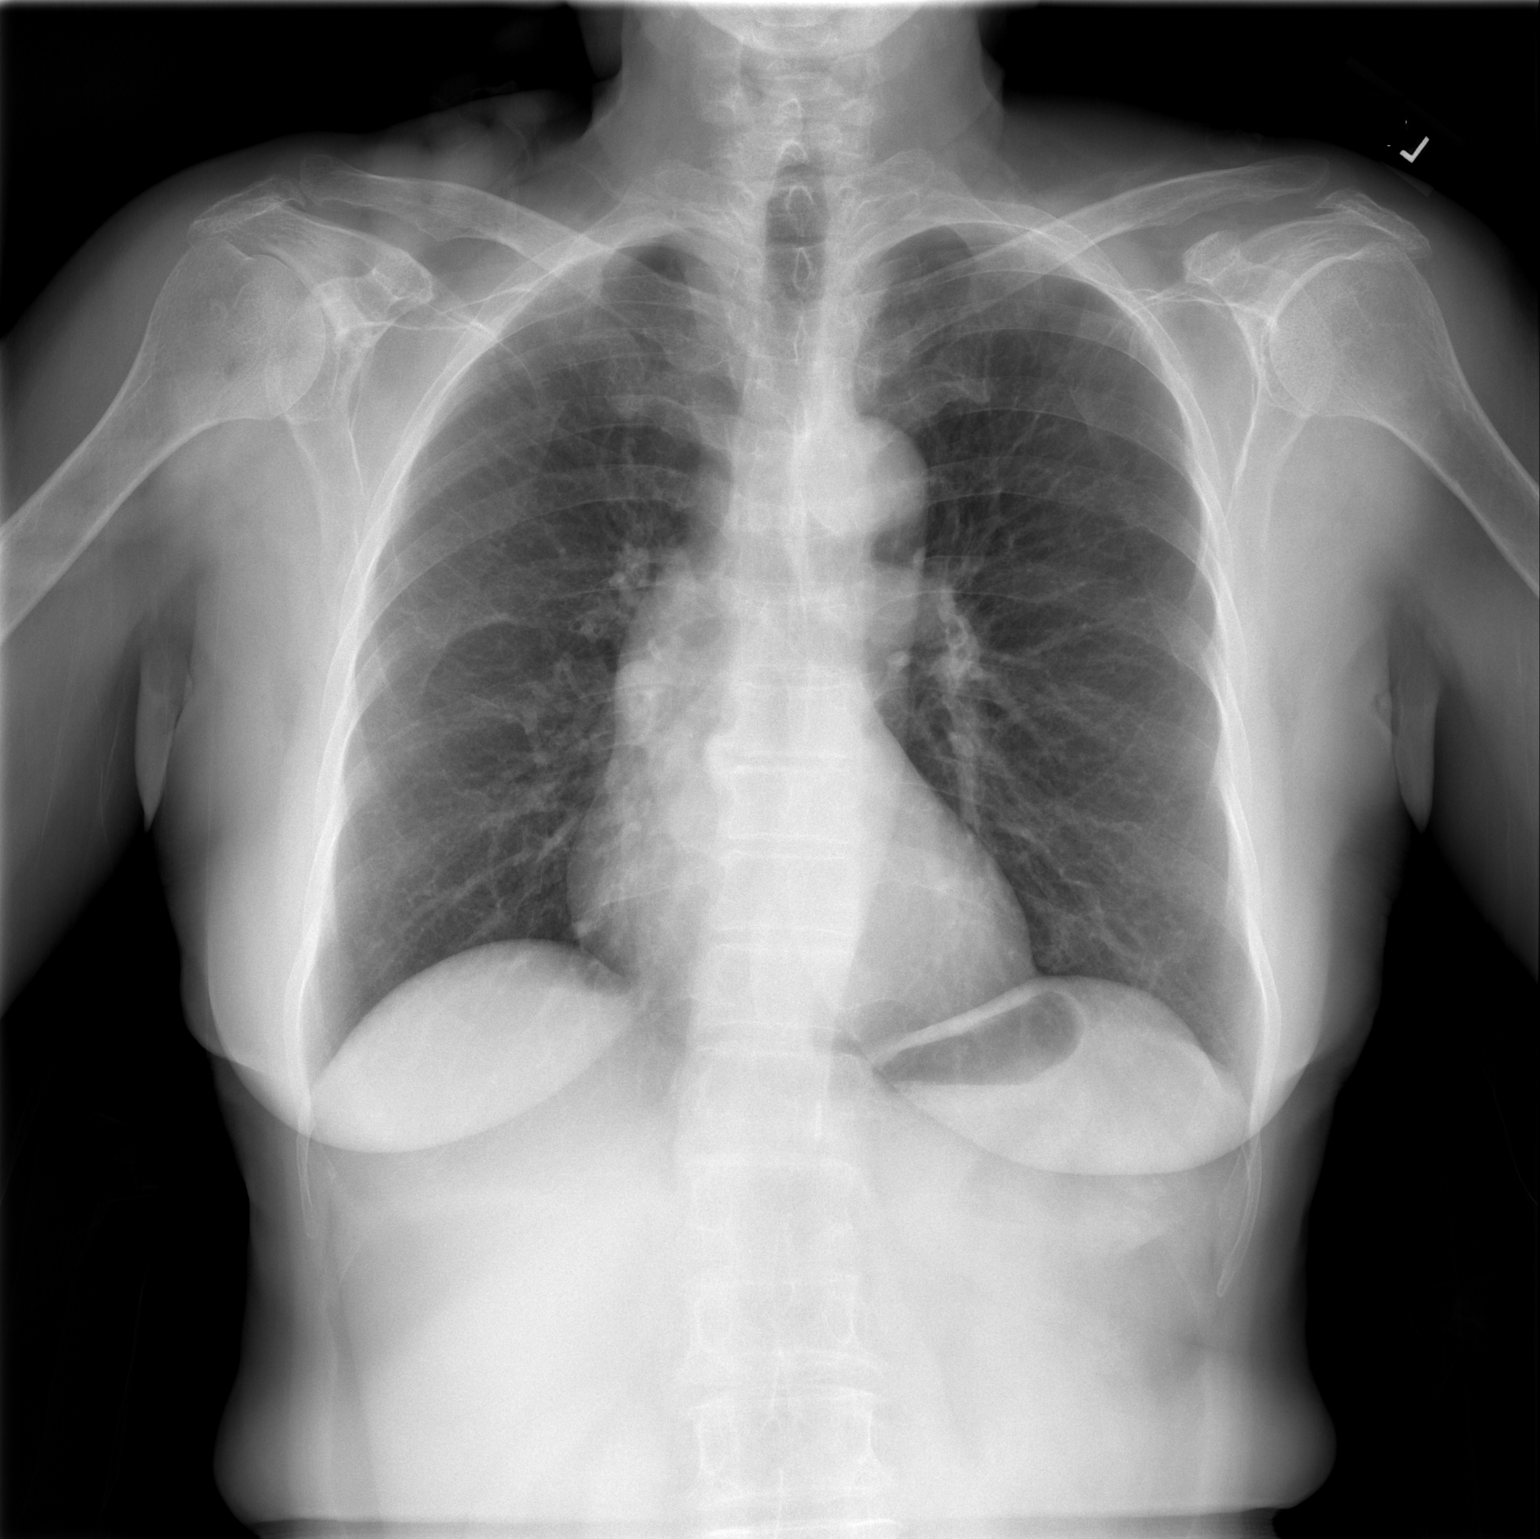

[w chest lat]
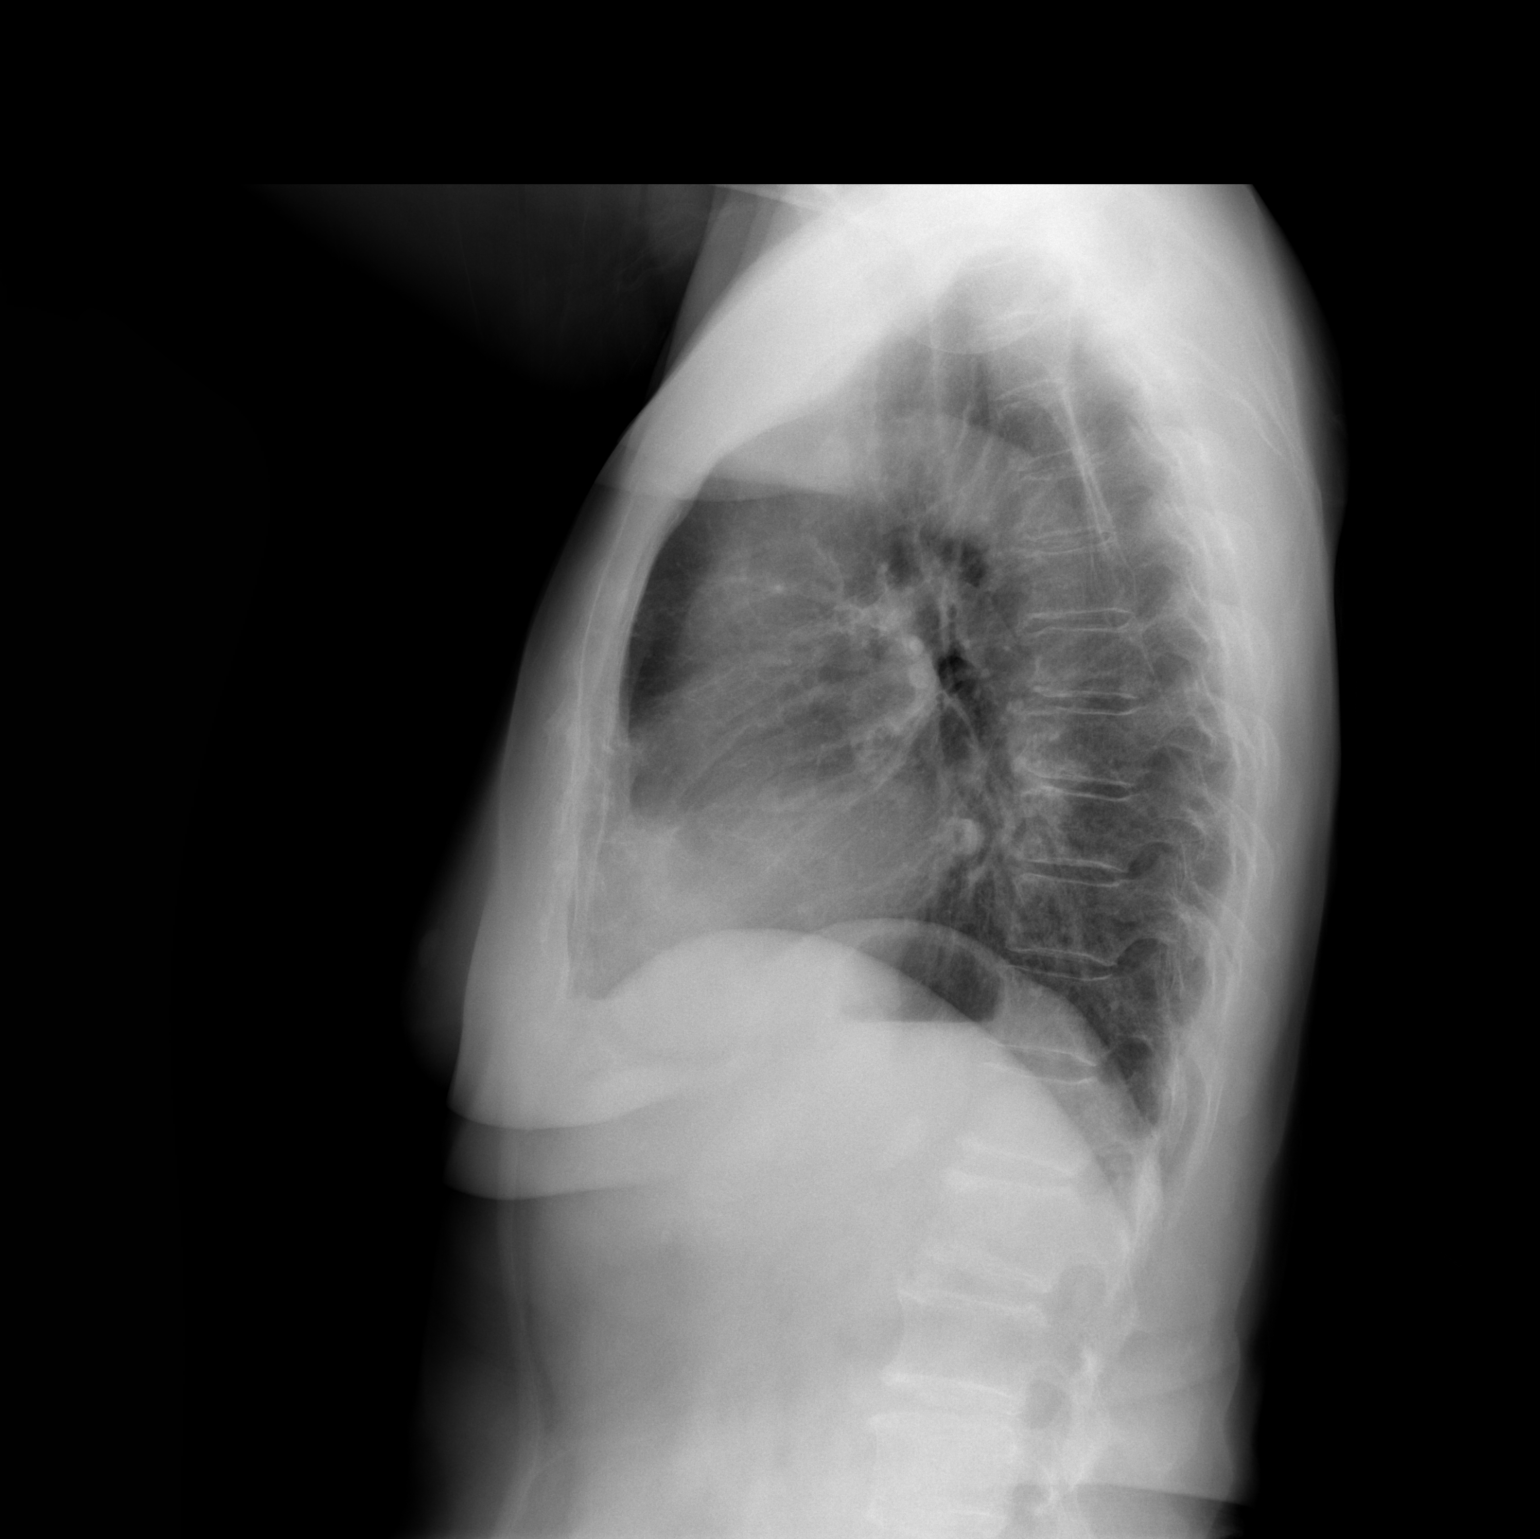

[2 of 2 positions shown; findings below may reference images not displayed]

FINDINGS: Two views of the chest show no pneumonia or effusion.  Again there is peribronchial thickening suggestive of bronchitis.  The heart is within upper limits of normal.
IMPRESSION: Probable bronchitis.  No pneumonia.

## 2007-09-11 ENCOUNTER — Encounter: Admission: RE | Admit: 2007-09-11 | Discharge: 2007-09-11 | Payer: Self-pay | Admitting: Cardiovascular Disease

## 2007-09-11 IMAGING — CR DG CHEST 2V
2 series · 2 of 2 positions shown · non-contrast
Comparison: [DATE]

CLINICAL DATA: Reason for exam: rule out pneumonia

CHEST - 2 VIEW

[view not recorded (1 of 2)]
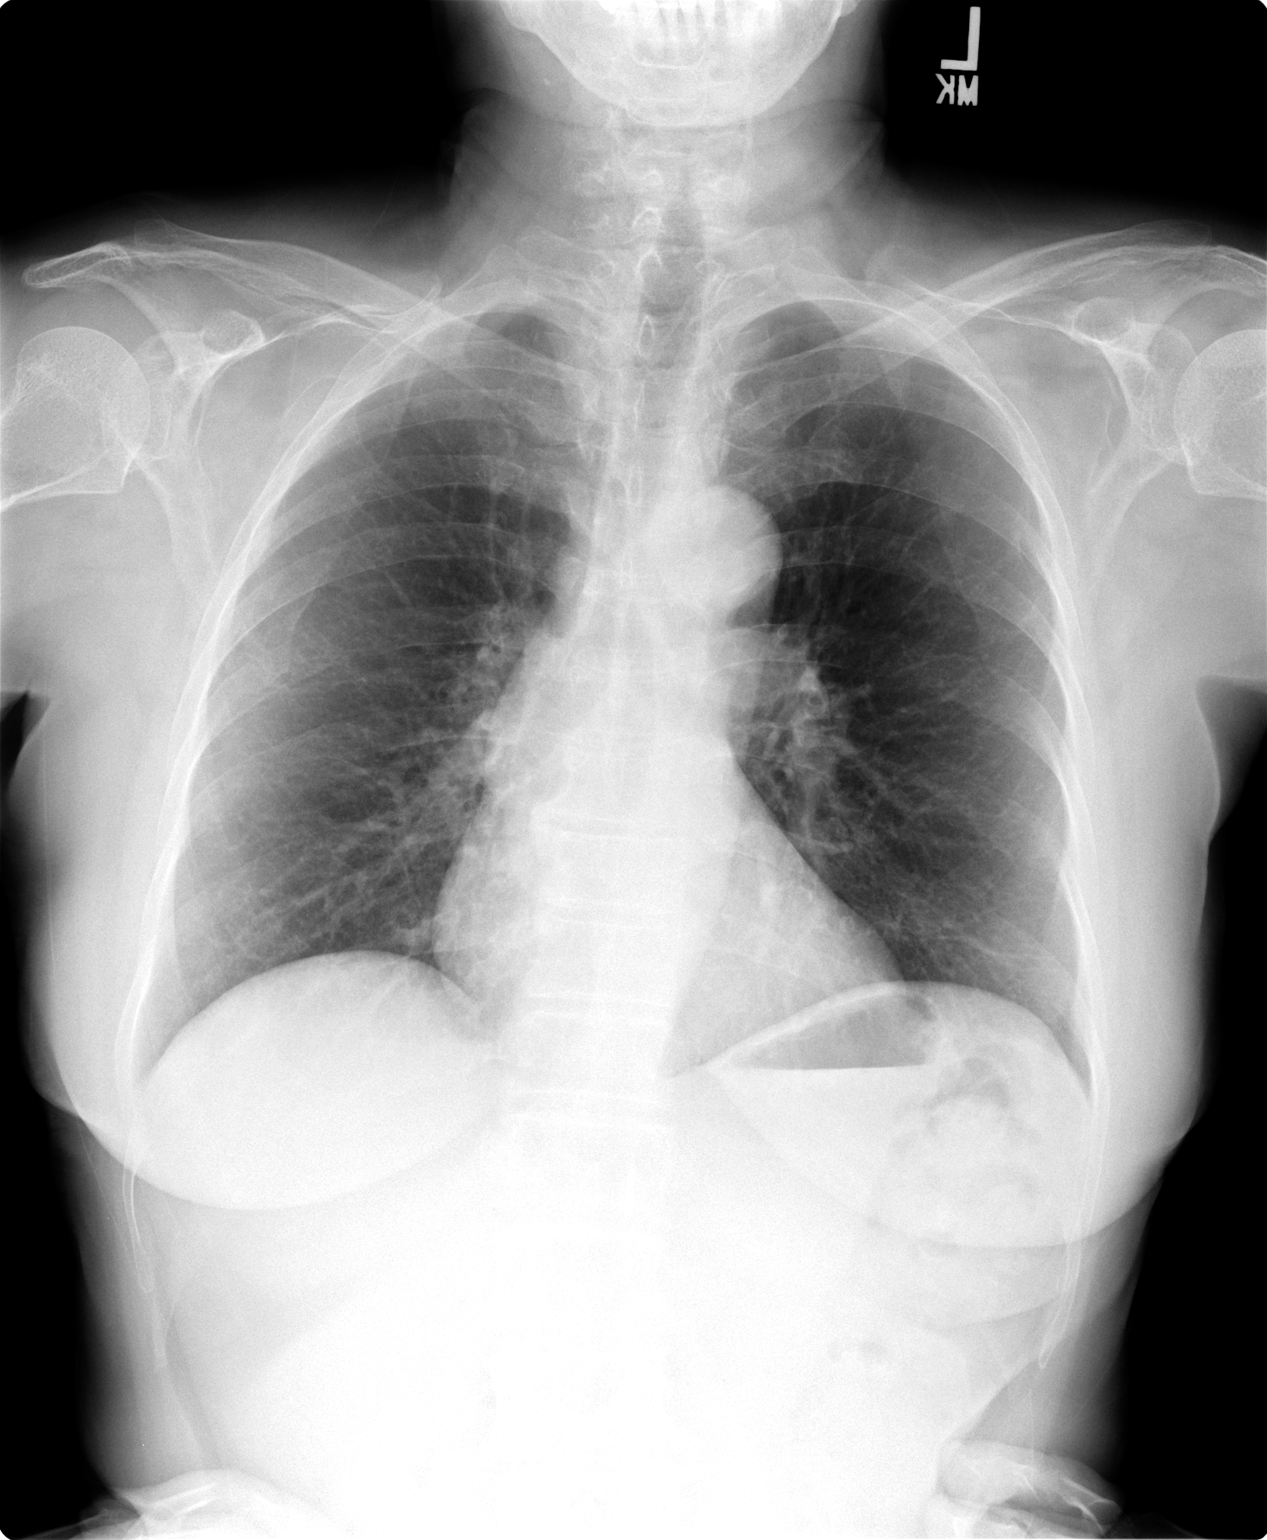

[view not recorded (2 of 2)]
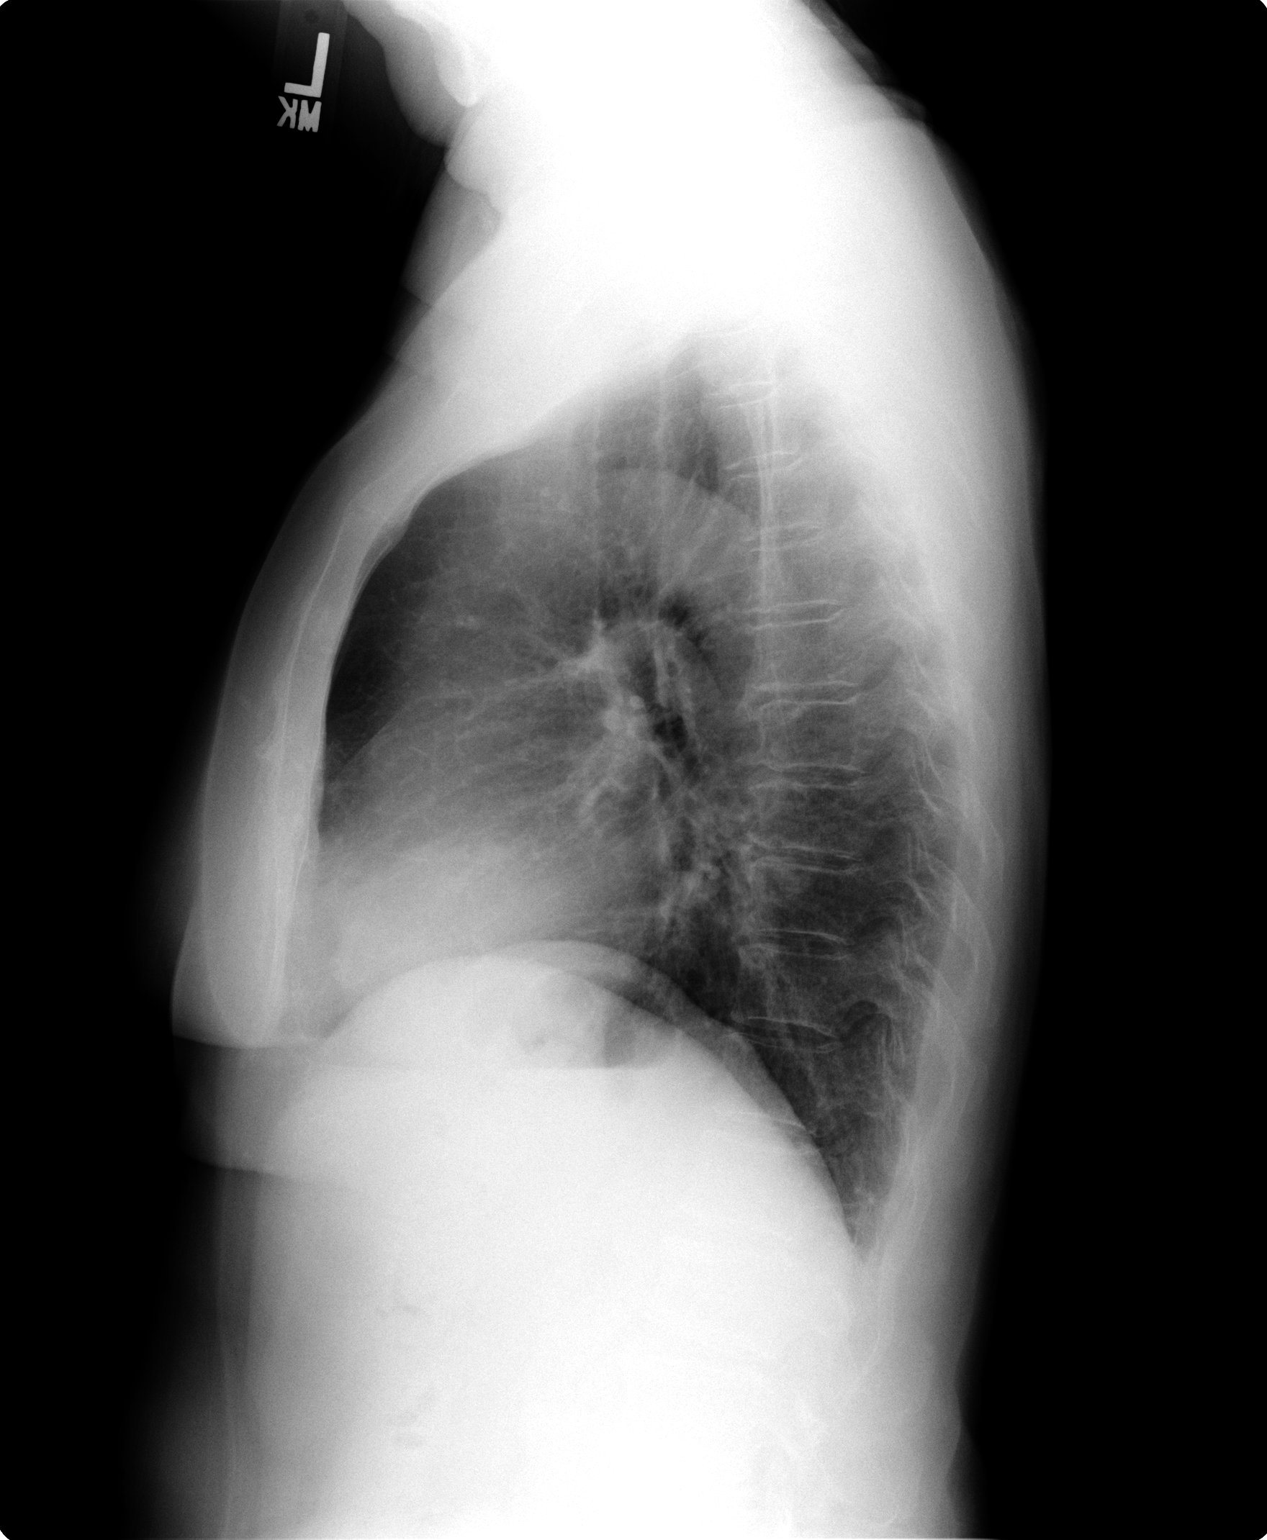

[2 of 2 positions shown; findings below may reference images not displayed]

FINDINGS: The heart size and mediastinal contours are within normal
limits.  Both lungs are clear.  Intact bony thorax
IMPRESSION: No active disease.

## 2008-11-11 ENCOUNTER — Encounter: Admission: RE | Admit: 2008-11-11 | Discharge: 2008-11-11 | Payer: Self-pay | Admitting: Cardiovascular Disease

## 2008-11-28 ENCOUNTER — Encounter: Admission: RE | Admit: 2008-11-28 | Discharge: 2008-11-28 | Payer: Self-pay | Admitting: Cardiovascular Disease

## 2009-02-28 ENCOUNTER — Encounter: Admission: RE | Admit: 2009-02-28 | Discharge: 2009-02-28 | Payer: Self-pay | Admitting: Cardiovascular Disease

## 2009-02-28 IMAGING — CT CT HEAD W/O CM
2 series · 15 of 30 positions shown, 19 images · non-contrast
Comparison: [DATE].

CLINICAL DATA: 74-year-old female with headache posteriorly.
Several syncopal episodes.

CT HEAD WITHOUT CONTRAST
TECHNIQUE: Contiguous axial images were obtained from the base of
the skull through the vertex without contrast.

[Series 2: head w/o · axial · non-contrast · 0.49mm/px · z∈[+20,+143]mm · 13 of 28 slices shown, 17 images]
[im 2/28  brain]
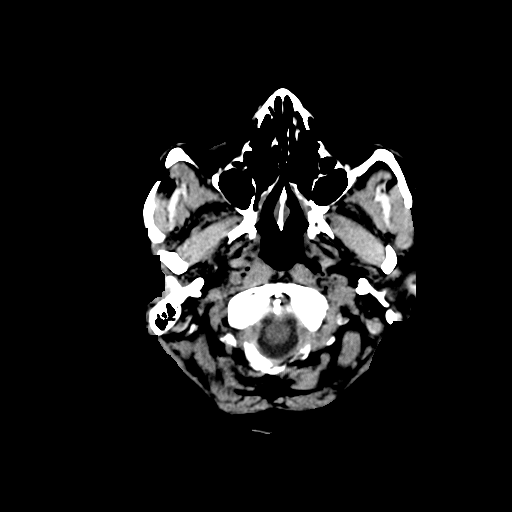
[im 2/28  bone]
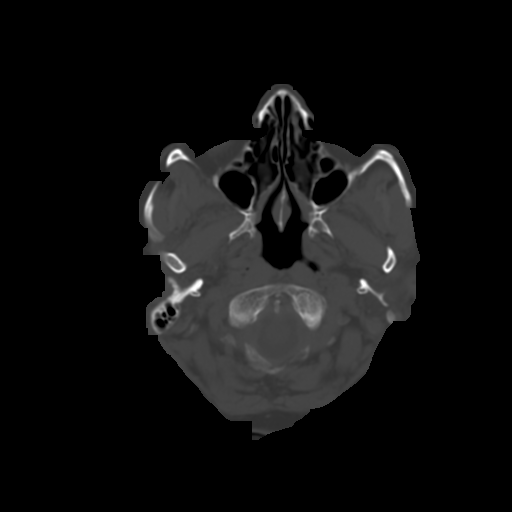
[im 4/28  brain]
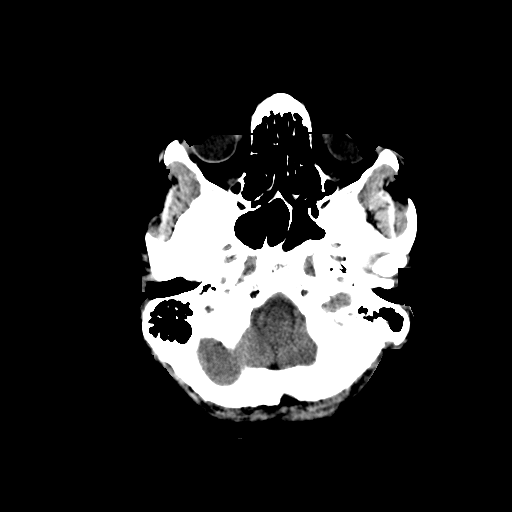
[im 6/28  brain]
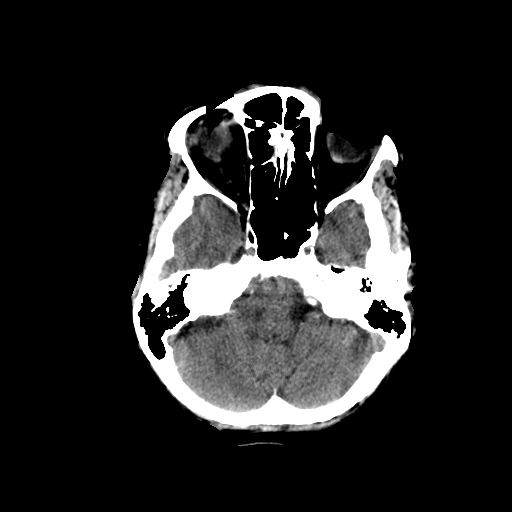
[im 8/28  brain]
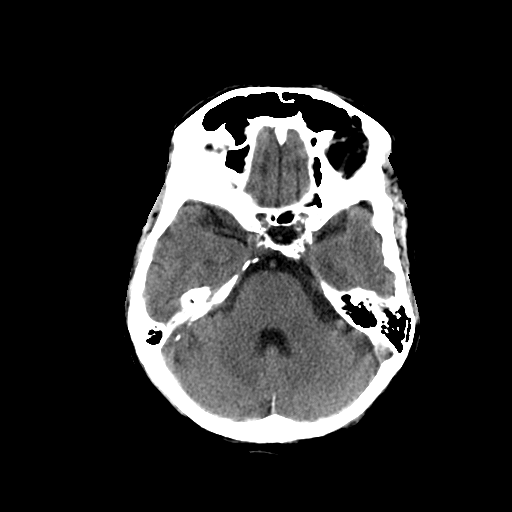
[im 10/28  brain]
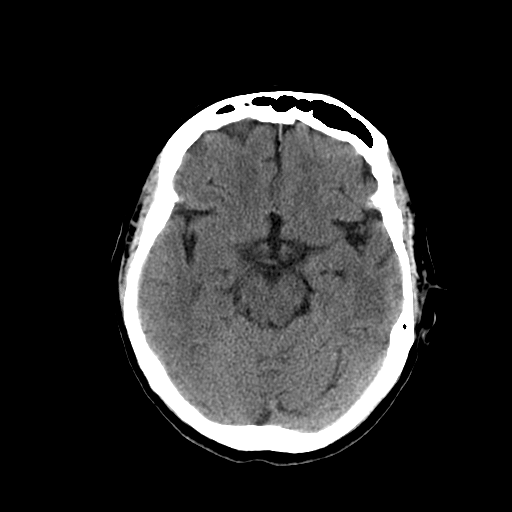
[im 10/28  bone]
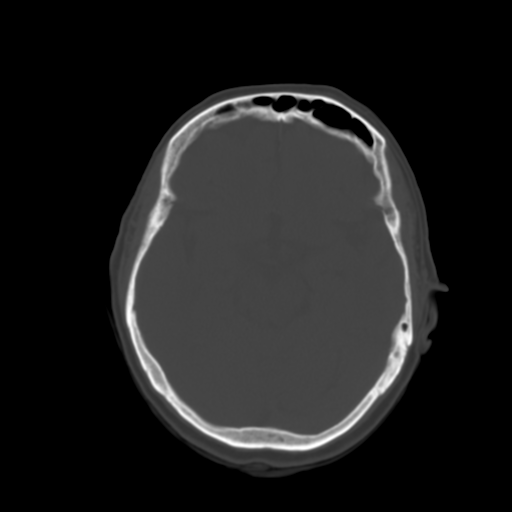
[im 12/28  brain]
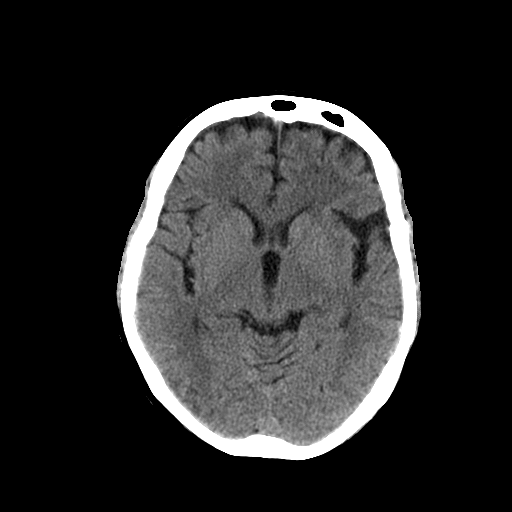
[im 14/28  brain]
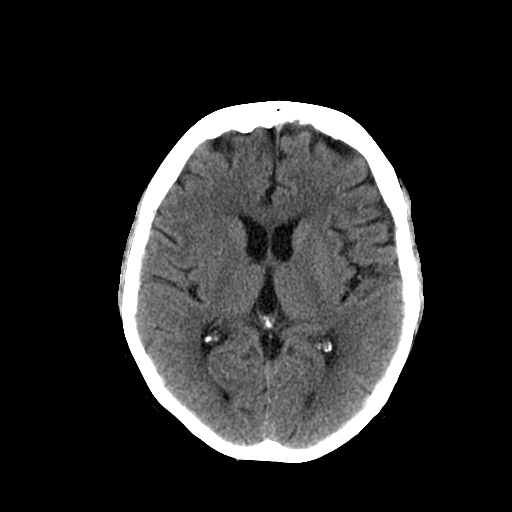
[im 16/28  brain]
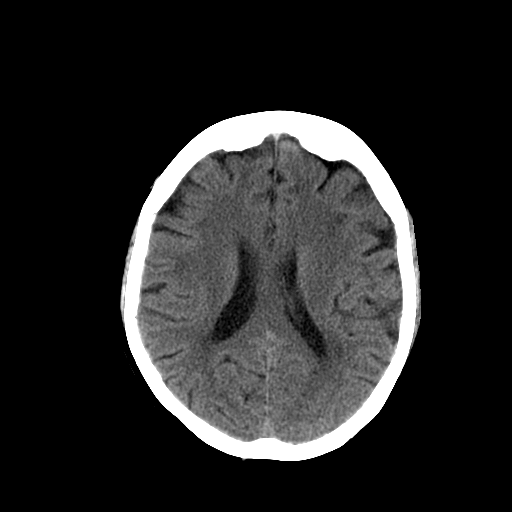
[im 18/28  brain]
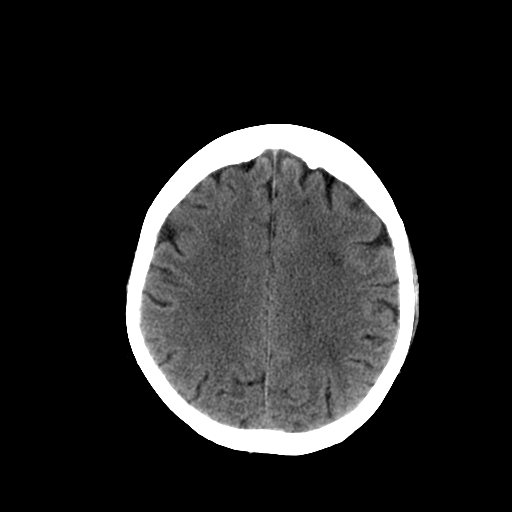
[im 18/28  bone]
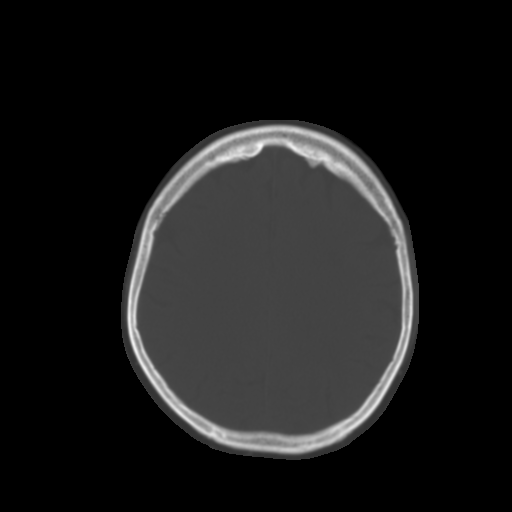
[im 20/28  brain]
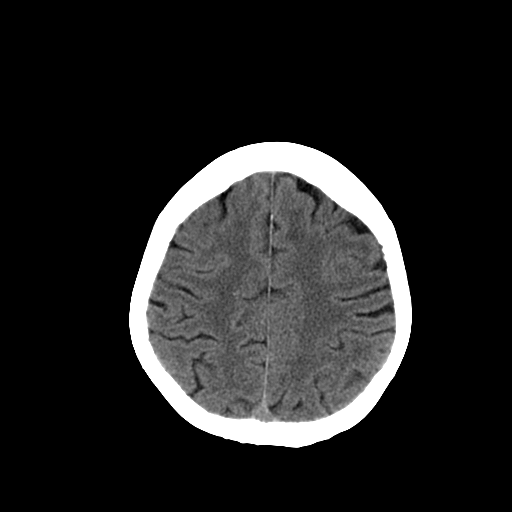
[im 22/28  brain]
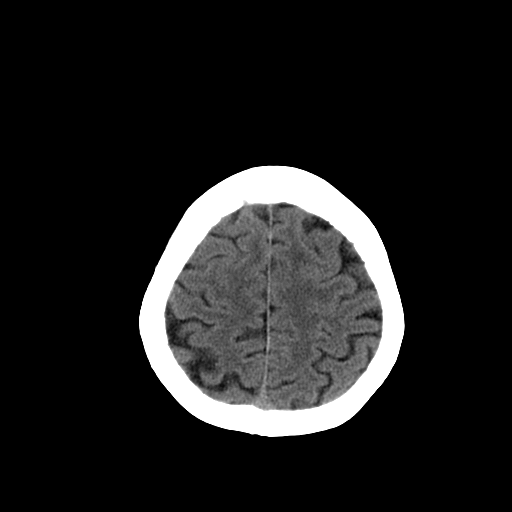
[im 24/28  brain]
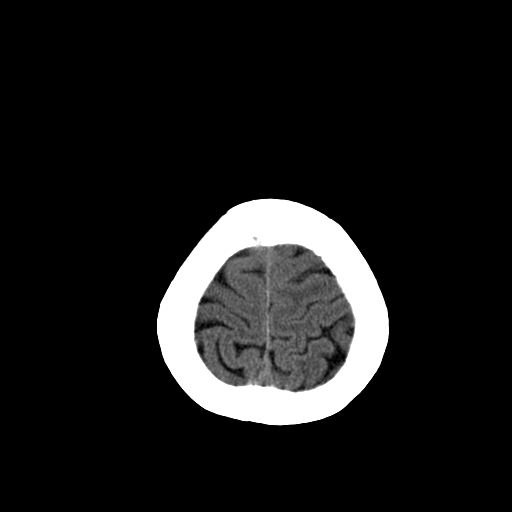
[im 26/28  brain]
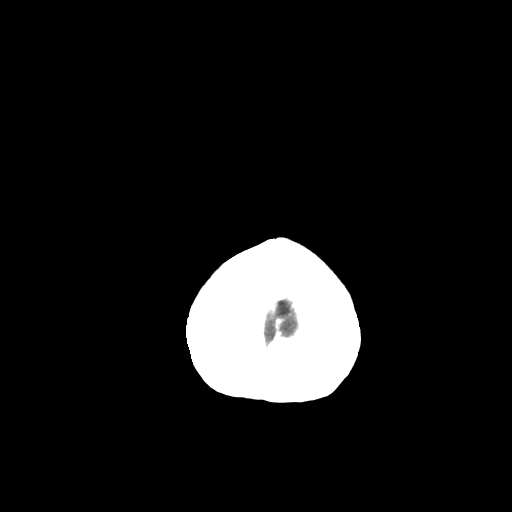
[im 26/28  bone]
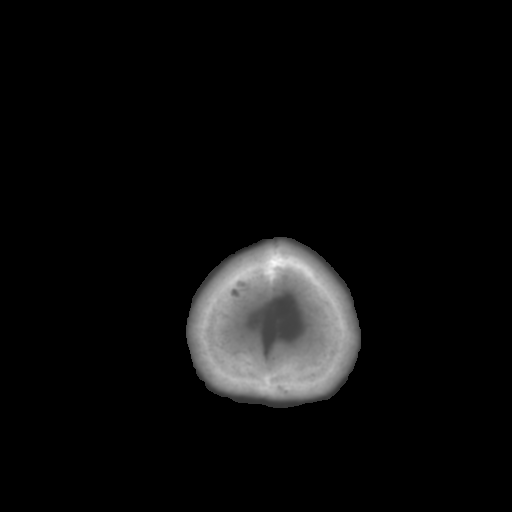

[Series 3: head bone · axial · 0.49mm/px · z∈[+20,+40]mm · 2 of 28 slices shown]
[im 2/28  bone]
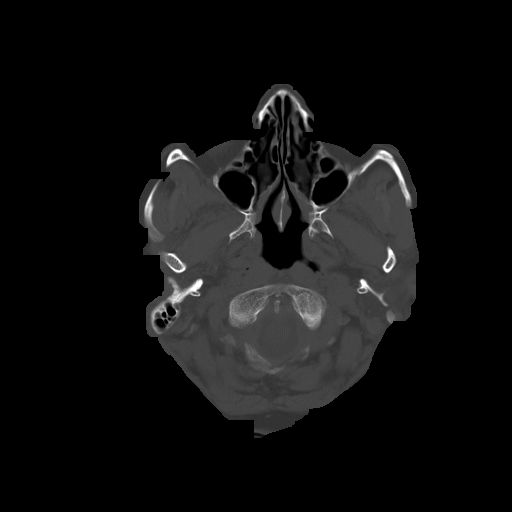
[im 6/28  bone]
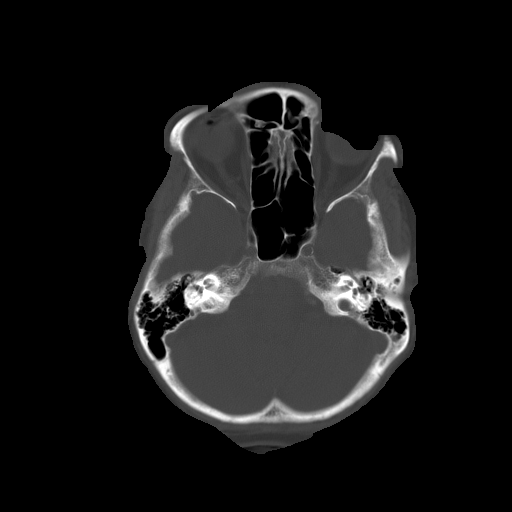

[15 of 30 positions shown; findings below may reference images not displayed]

FINDINGS: Stable orbits.  Stable scalp with incidental small left
forehead lipoma (series 3 image 12). No acute osseous abnormality
identified.  Visualized paranasal sinuses and mastoids are clear.
Calcified atherosclerosis at the skull base.

No midline shift, mass effect, or evidence of mass lesion.
Ventricular size and configuration are within normal limits.  No
acute intracranial hemorrhage identified.  Multi focal subcortical
white matter hypodensity is progressed since [BM]. No evidence of
cortically based acute infarction identified.  No suspicious
intracranial vascular hyperdensity.
IMPRESSION: 1. No acute intracranial abnormality.
2.  Multifocal subcortical white matter hypodensity is progressed
since [BM].  This is nonspecific, but most commonly due to small
vessel ischemia.

## 2009-08-07 ENCOUNTER — Encounter: Admission: RE | Admit: 2009-08-07 | Discharge: 2009-08-07 | Payer: Self-pay | Admitting: Cardiovascular Disease

## 2009-08-07 IMAGING — CR DG ABDOMEN 1V
1 series · 1 of 1 positions shown · non-contrast
Comparison: CT abdomen pelvis of [DATE]

CLINICAL DATA: Lower abdominal pain

ABDOMEN - 1 VIEW

[t abdomen supine]
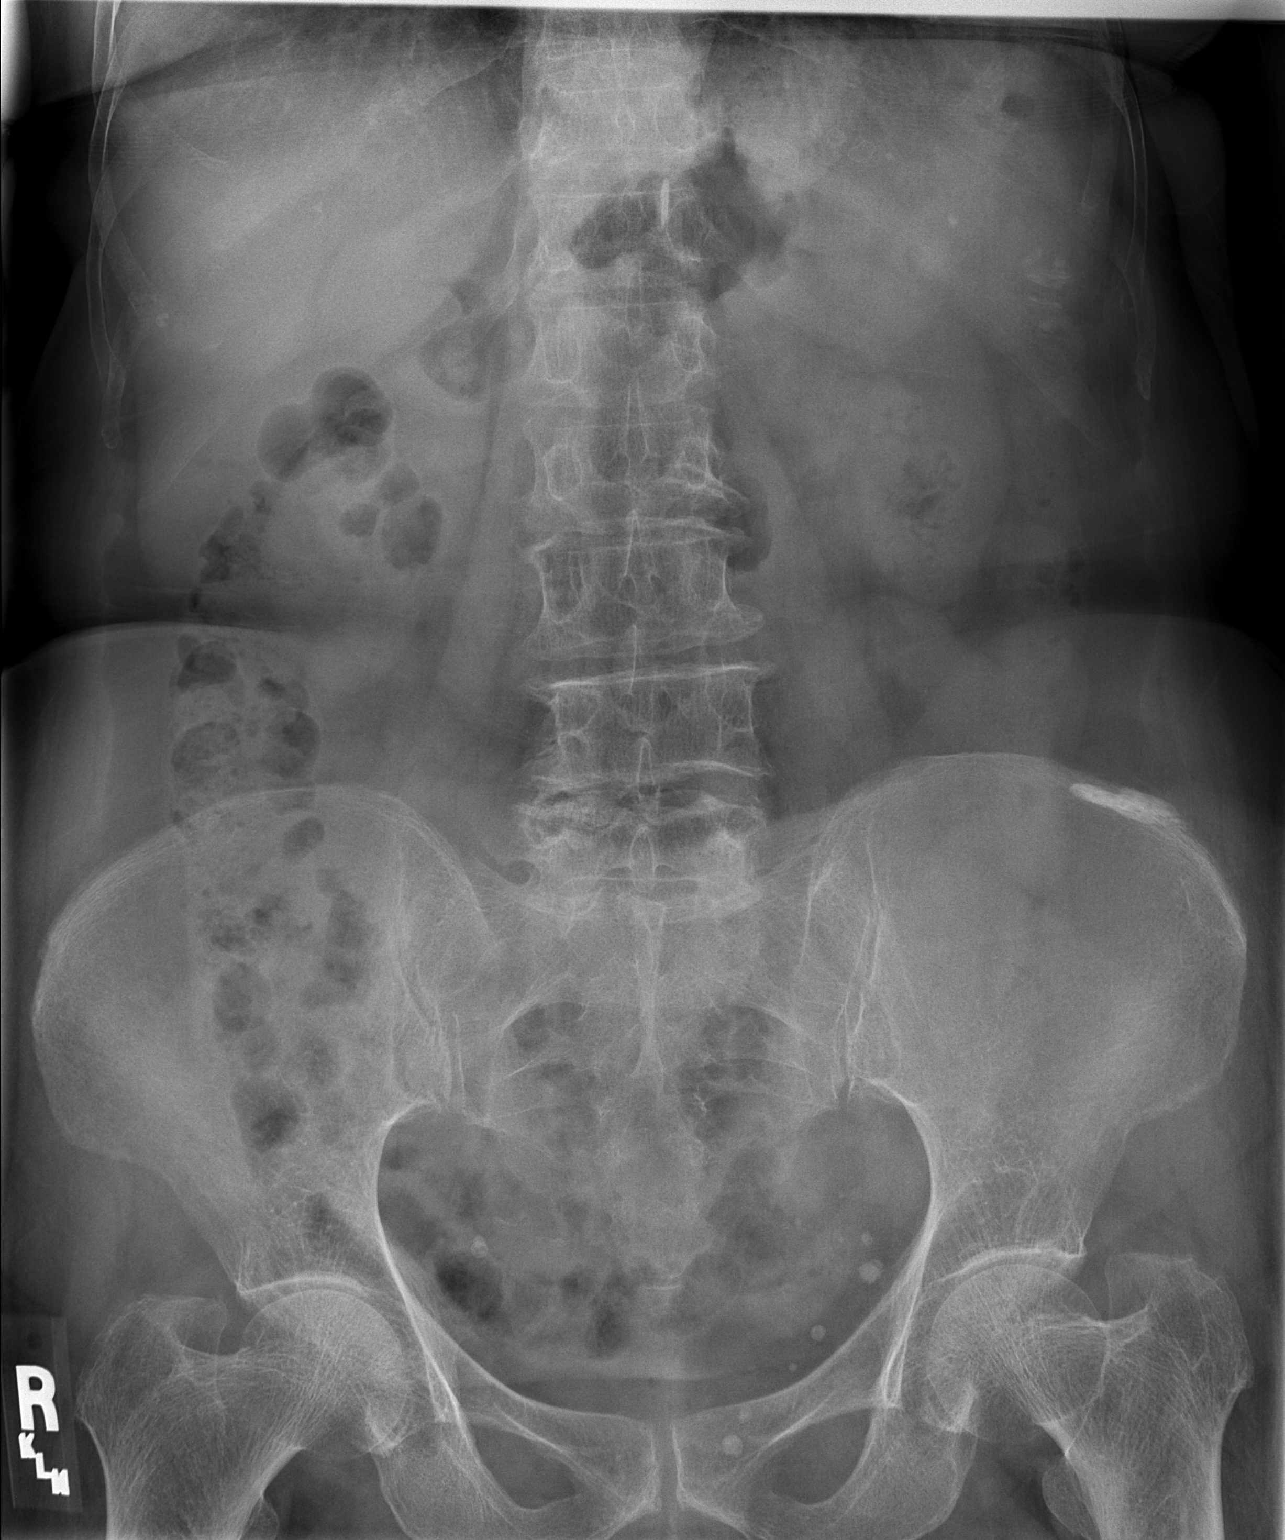

[1 of 1 positions shown; findings below may reference images not displayed]

FINDINGS: A supine film of the abdomen shows a nonspecific bowel
gas pattern.  No bowel obstruction is seen.  No renal calculi are
noted.  The bones appear osteopenic and there are degenerative
changes in the mid and lower lumbar spine.
IMPRESSION: No bowel obstruction.  No opaque calculi.  Osteopenia with
degenerative change.

## 2010-01-18 ENCOUNTER — Encounter
Admission: RE | Admit: 2010-01-18 | Discharge: 2010-01-18 | Payer: Self-pay | Source: Home / Self Care | Attending: Family Medicine | Admitting: Family Medicine

## 2010-01-27 ENCOUNTER — Encounter: Payer: Self-pay | Admitting: Cardiovascular Disease

## 2010-01-28 ENCOUNTER — Encounter: Payer: Self-pay | Admitting: Obstetrics and Gynecology

## 2010-04-16 ENCOUNTER — Encounter: Payer: Self-pay | Admitting: Oncology

## 2010-04-25 LAB — POC HEMOCCULT BLD/STL (HOME/3-CARD/SCREEN)

## 2010-05-07 ENCOUNTER — Other Ambulatory Visit: Payer: Self-pay | Admitting: Family Medicine

## 2010-05-07 ENCOUNTER — Ambulatory Visit: Admission: RE | Admit: 2010-05-07 | Payer: MEDICARE | Source: Ambulatory Visit

## 2010-05-07 ENCOUNTER — Ambulatory Visit (HOSPITAL_COMMUNITY)
Admission: RE | Admit: 2010-05-07 | Discharge: 2010-05-07 | Disposition: A | Discharge status: Home / Self Care | Payer: MEDICARE | Source: Ambulatory Visit | Attending: Family Medicine | Admitting: Family Medicine

## 2010-05-07 ENCOUNTER — Ambulatory Visit
Admission: RE | Admit: 2010-05-07 | Discharge: 2010-05-07 | Disposition: A | Discharge status: Home / Self Care | Payer: MEDICARE | Source: Ambulatory Visit | Attending: Family Medicine | Admitting: Family Medicine

## 2010-05-07 DIAGNOSIS — R071 Chest pain on breathing: Secondary | ICD-10-CM

## 2010-05-07 DIAGNOSIS — R079 Chest pain, unspecified: Secondary | ICD-10-CM

## 2010-05-07 IMAGING — CR DG RIBS W/ CHEST 3+V*R*
5 series · 5 of 5 positions shown · non-contrast
Comparison: Chest radiographs [DATE] and [DATE].

CLINICAL DATA: Right mid to upper anterior chest pain.  No known
injury.

RIGHT RIBS AND CHEST - 3+ VIEW

[view not recorded (1 of 5)]
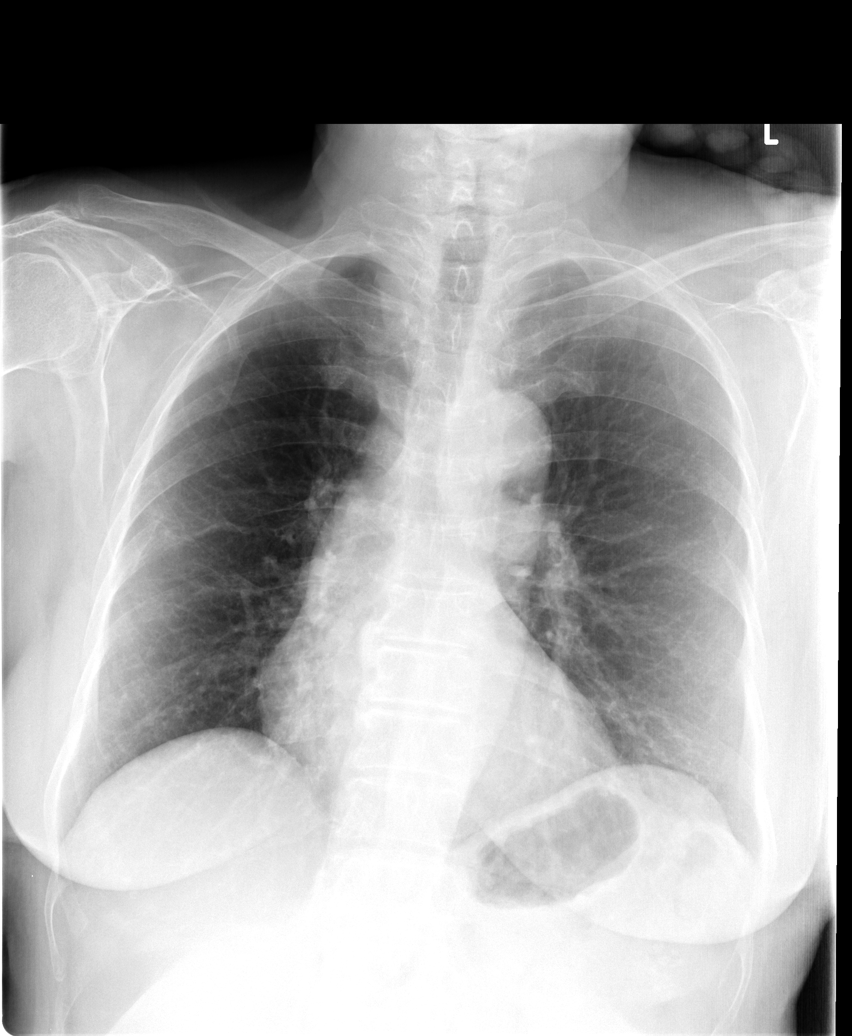

[view not recorded (2 of 5)]
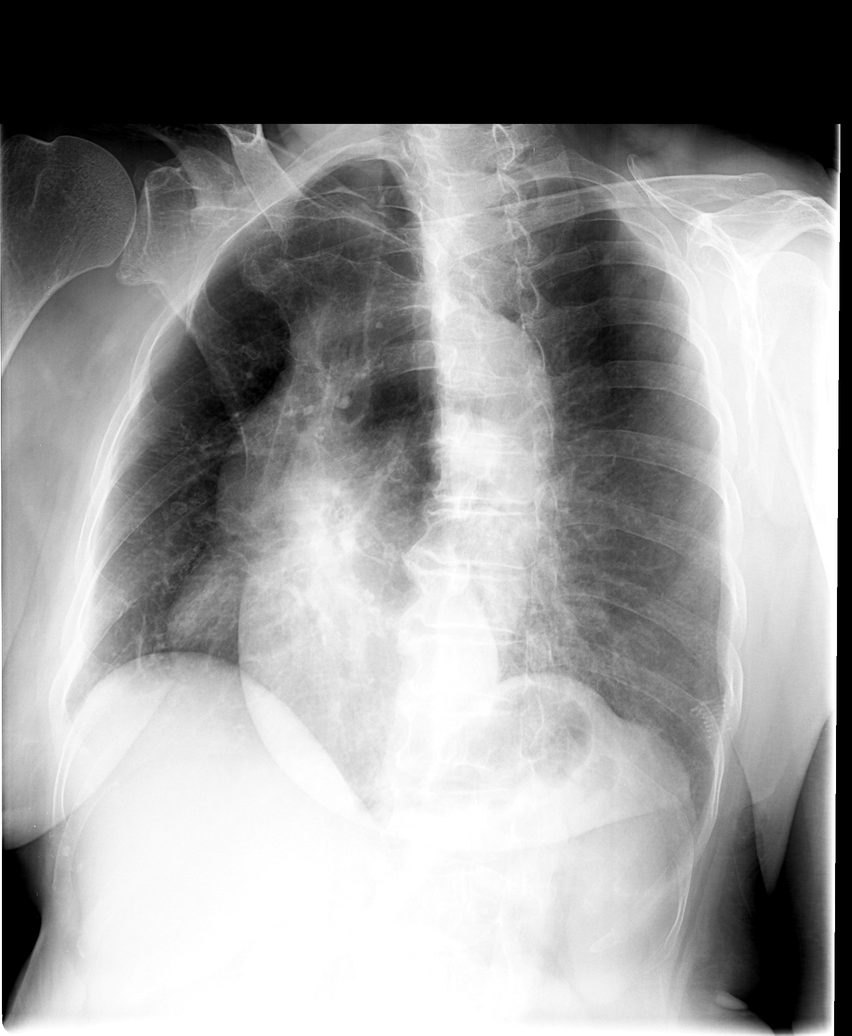

[view not recorded (3 of 5)]
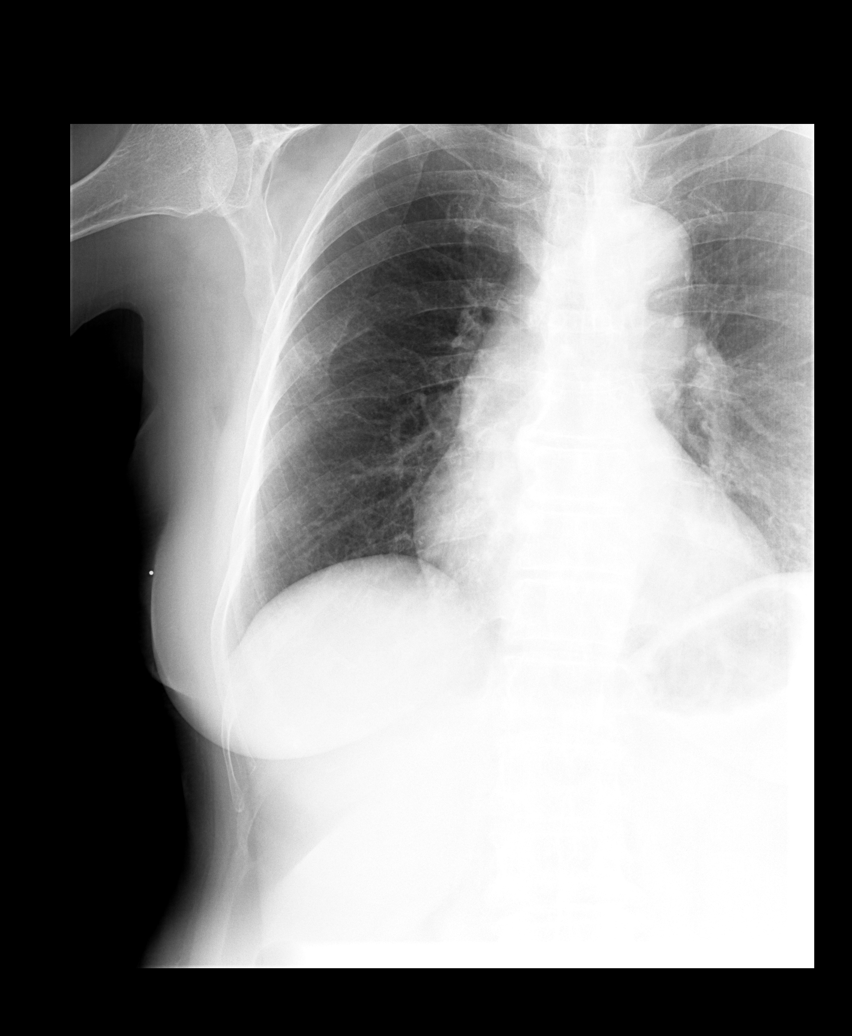

[view not recorded (4 of 5)]
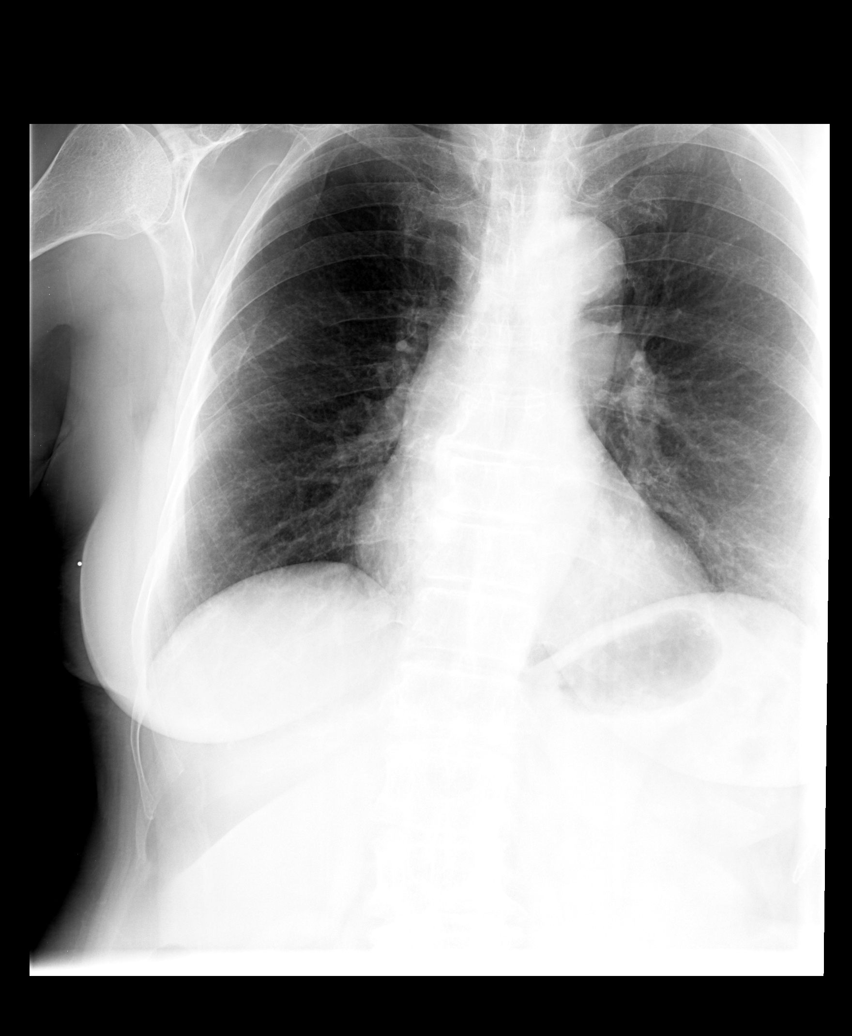

[view not recorded (5 of 5)]
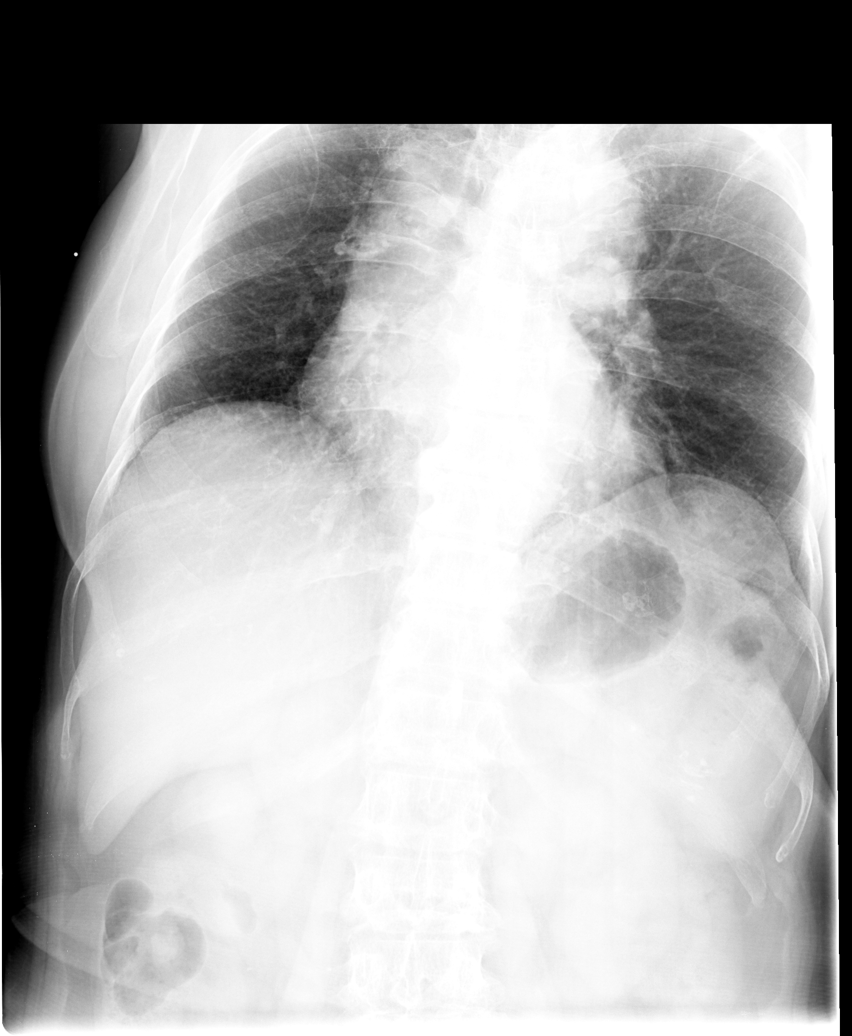

[5 of 5 positions shown; findings below may reference images not displayed]

FINDINGS: The heart size and mediastinal contours are stable.  The
lungs are clear.  There is no pleural effusion or pneumothorax.
There is generalized osteopenia.  No acute right-sided rib fracture
is identified.
IMPRESSION: No evidence of right-sided rib fracture, pleural effusion or
pneumothorax.

## 2010-05-25 NOTE — Op Note (Signed)
[name], Katherine Novak               ACCOUNT NO.:  [id number]   MEDICAL RECORD NO.:  [id number]          PATIENT TYPE:  AMB   LOCATION:  ENDO                         FACILITY:  [facility]   PHYSICIAN:  Anselmo Rod, M.D.  DATE OF BIRTH:  1934-01-28   DATE OF PROCEDURE:  02/04/2004  DATE OF DISCHARGE:                                 OPERATIVE REPORT   PROCEDURE:  Esophagogastroduodenoscopy.   ENDOSCOPIST:  Charna Elizabeth, M.D.   INSTRUMENT USED:  Olympus video panendoscope.   INDICATIONS FOR PROCEDURE:  76 year old [place] female undergoing an EGD for  occasional dysphagia, rule out esophagitis, peptic stricture, ulcer disease,  etc., the patient has a history of abnormal weight loss with recent change  in bowel habits and episodic abdominal pain.   PREPROCEDURE PREPARATION:  Informed consent was obtained from the patient.  The patient was fasted for eight hours prior to the procedure.   PREPROCEDURE PHYSICAL:  Patient with stable vital signs.  Neck supple.  Chest clear to auscultation.  S1 and S2 regular.  Abdomen soft with normal  bowel sounds.   DESCRIPTION OF PROCEDURE:  The patient was placed in the left lateral  decubitus position, sedated with 50 mg of Demerol and 5 mg Versed in slow  incremental doses.  Once the patient was adequately sedated, maintained on  low flow oxygen and continuous cardiac monitoring, the Olympus video  panendoscope was advanced through the mouth piece over the tongue into the  esophagus under direct vision.  The entire esophagus appeared widely patent.  A small patch of what appears to be Barrett's mucosa was seen in the distal  esophagus and was biopsied for pathology.  Submucosal bleb was seen in the  mid esophagus of unclear significance.  A lipoma was noted in the proximal  small bowel.  This was submucosal in nature and had a yellowish appearance,  and therefore, was not biopsied.  No ulcer or erosions were seen.  The  patient tolerated the  procedure well without immediate complications.   IMPRESSION:  1.  Distal esophageal changes consistent with Barrett's mucosa, biopsies      done, results pending.  2.  Submucosal bleb in the mid esophagus, question etiology.  3.  Lipoma in proximal small bowel.  4.  Mild, diffuse gastritis.  5.  Otherwise, normal stomach, no hiatal hernia seen.   RECOMMENDATIONS:  1.  Await pathology results.  2.  Avoid nonsteroidal including aspirin for now.  3.  Proceed with colonoscopy at this time, further recommendations will be      made after the colonoscopy has bene done.      JNM/MEDQ  D:  02/04/2004  T:  02/04/2004  Job:  [id number]   cc:   Ricki Rodriguez, M.D.  [address]  [place] [address]  Fax: [phone]   Delford Field, M.D.

## 2010-05-25 NOTE — Op Note (Signed)
[name], [name]               ACCOUNT NO.:  [id number]   MEDICAL RECORD NO.:  [id number]          PATIENT TYPE:  AMB   LOCATION:  ENDO                         FACILITY:  [facility]   PHYSICIAN:  Anselmo Rod, M.D.  DATE OF BIRTH:  1934-10-07   DATE OF PROCEDURE:  02/04/2004  DATE OF DISCHARGE:                                 OPERATIVE REPORT   PROCEDURE:  Screening colonoscopy.   ENDOSCOPIST:  Charna Elizabeth, M.D.   INSTRUMENT USED:  Olympus video colonoscope.   INDICATIONS FOR PROCEDURE:  Abnormal weight loss in a 76 year old [place]  female undergoing a screening colonoscopy to rule out colonic polyps,  masses, etc.  The patient had episodic abdominal pain that seems to have  resolved at the present time.   PREPROCEDURE PREPARATION:  Informed consent was obtained from the patient.  The patient was fasted for eight hours prior to the procedure and prepped  with a bottle of magnesium citrate and a gallon of GoLYTELY the night prior  to the procedure.   PREPROCEDURE PHYSICAL:  Patient with stable vital signs.  Neck supple.  Chest clear to auscultation.  S1 and S2 regular.  Abdomen soft with normal  bowel sounds.   DESCRIPTION OF PROCEDURE:  The patient was placed in the left lateral  decubitus position, sedated with an additional 25 mg of Demerol and 2.5 mg  Versed in slow incremental doses.  Once the patient was adequately sedated,  maintained on low flow oxygen and continuous cardiac monitoring, the Olympus  video colonoscope was advanced from the rectum to the cecum.  There was some  residual stool in the colon, multiple washes were done.  No masses, polyps,  erosions, ulcerations, or diverticula were seen.  Retroflexion in the rectum  revealed no abnormality.  The patient tolerated the procedure well without  complications.   IMPRESSION:  Normal colonoscopy to the cecum, no masses, polyps, erosions,  ulcerations or diverticula seen.   RECOMMENDATIONS:  1.  Repeat  colonoscopy recommended in the next five years.  2.  Outpatient follow up in the next two weeks for further recommendations.      Monitoring of the patient's weight will be done and recommendations made      in the future as needed.      JNM/MEDQ  D:  02/04/2004  T:  02/04/2004  Job:  [id number]   cc:   Ricki Rodriguez, M.D.  [address]  [place] [address]  Fax: [phone]   Delford Field, M.D.

## 2010-05-25 NOTE — Op Note (Signed)
NAMEARASELY, AKKERMAN                           ACCOUNT NO.:  [id number]   MEDICAL RECORD NO.:  [id number]                   PATIENT TYPE:  OIB   LOCATION:  2899                                 FACILITY:  [facility]   PHYSICIAN:  Beulah Gandy. Ashley Royalty, M.D.              DATE OF BIRTH:  1934-11-20   DATE OF PROCEDURE:  06/22/2002  DATE OF DISCHARGE:                                 OPERATIVE REPORT   ADMISSION DIAGNOSES:  1. Retained lens material, right eye.  2. Glaucoma, right eye.   PROCEDURE:  Pars plana vitrectomy with removal of retained lens material,  retinal photocoagulation, and anterior chamber wash-out.   SURGEON:  Beulah Gandy. Ashley Royalty, M.D.   ASSISTANT:  Merian Capron, M.A.   ANESTHESIA:  General.   DETAILS:  Usual prep and drape.  Peritomy at 9: 30 and 2:30 clock hour to  accommodate the vitrectomy instruments.  Peritomy at 8 o'clock for the 6 mm  infusion port.  The 6 mm port was sutured into place.  A 27-gauge needle was  passed from the 2 o'clock cornea to the 8 o'clock angle, where lens material  was removed from this area.  The fluid was withdrawn and then washed into  the anterior chamber to remove all loose lens material.  Some lens material  was adherent to the angle and could not be washed out.  The attention was  then carried to the posterior segment.  The BIOM was moved into place.  Provisc was placed on the corneal surface.  The pars plana vitrectomy was  begun in the pupillary axis.  All vitreous and strands of vitreous with lens  material was carefully removed under low suction and rapid cutting.  A large  piece of the cataract was present inferiorly, and this was removed with the  chop technique and suction cutting.  The phacofragmatome was not required.  The vitrectomy was carried down to the retinal surface for 360 degrees with  wide-angle viewing.  All vitreous was removed.  Scleral depression was used  to gain access to the vitreous base, and all vitreous was  removed here as  well.  Once all the lens material and vitreous was removed, the instruments  were removed from the eye.  Nylon 9-0 was used to close the sclerotomy  sites.  The conjunctiva was closed with wet-field cautery.  The indirect  ophthalmoscope laser was moved into place, and careful inspection of the  periphery was carried out.  Two hundred twelve laser burns were placed in  the retinal periphery with a power of 400 milliwatts, 1000 microns each, and  0.1 second each.  These weak areas were surrounded and supported with laser.  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  Decadron was injected into the  lower subconjunctival space.  Polymyxin and gentamicin were irrigated into  Tenon's  space.  Atropine solution was applied.  Marcaine was injected around  the globe for postop pain.  Polysporin, a patch and shield were placed.  The  closing tension was 10 with a Barraquer tonometer.  The patient was awakened  and taken to recovery in satisfactory condition.                                               Beulah Gandy. Ashley Royalty, M.D.    JDM/MEDQ  D:  06/22/2002  T:  06/22/2002  Job:  [id number]

## 2010-10-15 LAB — INFLUENZA A AND B ANTIGEN (CONVERTED LAB)
Inflenza A Ag: NEGATIVE
Influenza B Ag: NEGATIVE

## 2011-01-10 ENCOUNTER — Other Ambulatory Visit: Payer: Self-pay | Admitting: Family Medicine

## 2011-01-10 DIAGNOSIS — Z1231 Encounter for screening mammogram for malignant neoplasm of breast: Secondary | ICD-10-CM

## 2011-02-12 ENCOUNTER — Ambulatory Visit
Admission: RE | Admit: 2011-02-12 | Discharge: 2011-02-12 | Disposition: A | Discharge status: Home / Self Care | Payer: PRIVATE HEALTH INSURANCE | Source: Ambulatory Visit | Attending: Family Medicine | Admitting: Family Medicine

## 2011-02-12 DIAGNOSIS — Z1231 Encounter for screening mammogram for malignant neoplasm of breast: Secondary | ICD-10-CM

## 2011-04-18 ENCOUNTER — Telehealth: Payer: Self-pay | Admitting: Internal Medicine

## 2011-04-18 NOTE — Telephone Encounter (Signed)
s/w son and he was offered 4/16 and 4/17 ans then stated he would have to c/b to sch  aom

## 2011-04-18 NOTE — Telephone Encounter (Signed)
appts made and printed for pt aom °

## 2011-04-22 ENCOUNTER — Telehealth: Payer: Self-pay | Admitting: Internal Medicine

## 2011-04-22 NOTE — Telephone Encounter (Signed)
Referred by Dr. Mila Palmer Dx- Chronic Leukocytosis

## 2011-04-23 ENCOUNTER — Other Ambulatory Visit (HOSPITAL_COMMUNITY)
Admission: RE | Admit: 2011-04-23 | Discharge: 2011-04-23 | Disposition: A | Discharge status: Home / Self Care | Payer: PRIVATE HEALTH INSURANCE | Source: Ambulatory Visit | Attending: Internal Medicine | Admitting: Internal Medicine

## 2011-04-23 ENCOUNTER — Ambulatory Visit: Payer: PRIVATE HEALTH INSURANCE

## 2011-04-23 ENCOUNTER — Encounter: Payer: Self-pay | Admitting: Medical Oncology

## 2011-04-23 ENCOUNTER — Ambulatory Visit (HOSPITAL_BASED_OUTPATIENT_CLINIC_OR_DEPARTMENT_OTHER): Payer: PRIVATE HEALTH INSURANCE | Admitting: Internal Medicine

## 2011-04-23 ENCOUNTER — Encounter: Payer: Self-pay | Admitting: Internal Medicine

## 2011-04-23 ENCOUNTER — Telehealth: Payer: Self-pay | Admitting: Internal Medicine

## 2011-04-23 ENCOUNTER — Ambulatory Visit: Payer: PRIVATE HEALTH INSURANCE | Admitting: Lab

## 2011-04-23 VITALS — BP 137/72 | HR 80 | Temp 96.8°F | Ht 60.25 in | Wt 140.3 lb

## 2011-04-23 DIAGNOSIS — C9110 Chronic lymphocytic leukemia of B-cell type not having achieved remission: Secondary | ICD-10-CM | POA: Insufficient documentation

## 2011-04-23 DIAGNOSIS — D72829 Elevated white blood cell count, unspecified: Secondary | ICD-10-CM

## 2011-04-23 DIAGNOSIS — D649 Anemia, unspecified: Secondary | ICD-10-CM

## 2011-04-23 DIAGNOSIS — E7800 Pure hypercholesterolemia, unspecified: Secondary | ICD-10-CM | POA: Insufficient documentation

## 2011-04-23 LAB — COMPREHENSIVE METABOLIC PANEL
ALT: 12 U/L (ref 0–35)
AST: 18 U/L (ref 0–37)
Alkaline Phosphatase: 84 U/L (ref 39–117)
Calcium: 9.6 mg/dL (ref 8.4–10.5)
Chloride: 107 mEq/L (ref 96–112)
Creatinine, Ser: 0.65 mg/dL (ref 0.50–1.10)

## 2011-04-23 LAB — CBC WITH DIFFERENTIAL/PLATELET
BASO%: 0.2 % (ref 0.0–2.0)
EOS%: 1.3 % (ref 0.0–7.0)
HCT: 35.3 % (ref 34.8–46.6)
MCH: 27.8 pg (ref 25.1–34.0)
MCHC: 31.4 g/dL — ABNORMAL LOW (ref 31.5–36.0)
NEUT%: 22.5 % — ABNORMAL LOW (ref 38.4–76.8)
RDW: 14.8 % — ABNORMAL HIGH (ref 11.2–14.5)
lymph#: 12.3 10*3/uL — ABNORMAL HIGH (ref 0.9–3.3)

## 2011-04-23 NOTE — Progress Notes (Signed)
Called Dr Lanette Hampshire office [phone]. She saw pt only once in 05/2010 after a referral from pts son - Dr Arrie Aran- phone [phone], mobile [phone]. I received the records , no BM report- from that date. I told pt other son that we Dr Donnald Garre needs the bone marrow biopsy report. He said he will call his brother  And get back to Dr Donnald Garre.

## 2011-04-23 NOTE — Telephone Encounter (Signed)
Gave pt appt calendar appt for October 2013 and pt was sent to labs

## 2011-04-23 NOTE — Progress Notes (Signed)
[facility] [phone]   [phone]  CONSULT NOTE  REASON FOR CONSULTATION:  77 years old [place] female with history of chronic lymphocytic leukemia.  HPI Katherine Novak is a 77 y.o. female was past medical history significant for arthritis, GERD, glaucoma, cataracts, dyslipidemia, hypertension, history of Barrett's esophagus. The patient came to the clinic today to establish care [place] a [place]ologist [place] moving from [place] to be close to her son. The patient mentioned that she was diagnosed with CLL almost 10 years ago. She was followed by Dr. Rosezella [place] in [place]. Her son mentioned that she had a bone marrow biopsy and aspirate performed there. She has been followed by observation. The patient denied having any significant complaints today. She has no weight loss or night sweats. She has no palpable lymphadenopathy. No significant chest pain or shortness of breath. She has occasional cough but no hemoptysis. He has never received any treatment for CLL before.  The patient is a widow. She has 4 children, one of them is a Solicitor in [place]. She was accompanied by her son Katherine Novak today. She has no history of smoking, alcohol or drug abuse.  @SFHPI @  Past Medical History  Diagnosis Date  . Hypertension   . Hypercholesterolemia   . GERD (gastroesophageal reflux disease)   . Barrett's esophagus   . Vitamin d deficiency   . Osteoporosis   . Arthritis   . Leukemia   . AAA (abdominal aortic aneurysm)     Past Surgical History  Procedure Date  . Carpel tunnel-left   . Cataracts bilateral    Family History  Problem Relation Age of Onset  . Cancer Brother     Social History History  Substance Use Topics  . Smoking status: Never Smoker   . Smokeless tobacco: Not on file  . Alcohol Use: No    No Known Allergies  Current Outpatient Prescriptions  Medication Sig Dispense Refill  .  acetaminophen (TYLENOL) 325 MG tablet Take 650 mg by mouth 2 (two) times daily.      Marland Kitchen alendronate (FOSAMAX) 70 MG tablet Take 70 mg by mouth every 7 (seven) days. Take with a full glass of water on an empty stomach.      . cholecalciferol (VITAMIN D) 1000 UNITS tablet Take 2,000 Units by mouth daily.      . diclofenac sodium (VOLTAREN) 1 % GEL Apply 1 application topically 4 (four) times daily.      Marland Kitchen HYDROcodone-acetaminophen (VICODIN) 5-500 MG per tablet Take 1 tablet by mouth every 6 (six) hours as needed.      . metoprolol tartrate (LOPRESSOR) 25 MG tablet Take 25 mg by mouth 2 (two) times daily.      Marland Kitchen omeprazole (PRILOSEC) 20 MG capsule Take 20 mg by mouth daily.      . rosuvastatin (CRESTOR) 5 MG tablet Take 5 mg by mouth daily.      Marland Kitchen ibuprofen (ADVIL,MOTRIN) 200 MG tablet Take 200 mg by mouth every 6 (six) hours as needed.        Review of Systems  A comprehensive review of systems was negative.  Physical Exam  JYN:WGNFA, healthy, no distress, well nourished and well developed SKIN: skin color, texture, turgor are normal, no rashes or significant lesions HEAD: Normocephalic, No masses, lesions, tenderness or abnormalities EYES: normal EARS: External ears normal OROPHARYNX:no exudate, no erythema and lips, buccal mucosa, and tongue normal  NECK: supple, no adenopathy LYMPH:  no palpable lymphadenopathy, no hepatosplenomegaly BREAST:not examined LUNGS: clear to auscultation  HEART: regular rate & rhythm, no murmurs and no gallops ABDOMEN:abdomen soft, non-tender, normal bowel sounds and no masses or organomegaly BACK: Back symmetric, no curvature. EXTREMITIES:no joint deformities, effusion, or inflammation, no edema, no skin discoloration, no clubbing, no cyanosis  NEURO: alert & oriented x 3 with fluent speech, no focal motor/sensory deficits  PERFORMANCE STATUS: ECOG 1  Studies/Results: CBC today showed white blood count 16.8 with absolute lymphocyte count of 12,300,  hemoglobin was 11.1, hematocrit 35.3% and platelets count of 205,000.    ASSESSMENT: This is a very pleasant 77 years old [place] female with a stage 0 chronic lymphocytic leukemia. The patient has been observation for the last 10 years with no evidence for disease progression. Her lab work today showed lymphocytosis but no significant lymphadenopathy. She has mild anemia.  PLAN: I discussed you that result with the patient and her son. I recommended for her continence observation for now. I would send peripheral blood for flow cytometry to confirm the diagnosis of CLL. I would see her back for followup visit in 6 months with repeat CBC. She was advised to call me immediately if she has any concerning symptoms in the interval.  All questions were answered. The patient knows to call the clinic with any problems, questions or concerns. We can certainly see the patient much sooner if necessary.  Thank you so much for allowing me to participate in the care of Katherine Novak. I will continue to follow up the patient with you and assist in her care.  I spent 25 minutes counseling the patient face to face. The total time spent in the appointment was 50 minutes.   Malashia Kamaka K. 04/23/2011, 4:42 PM

## 2011-04-23 NOTE — Progress Notes (Signed)
Primary language gujariti

## 2011-04-25 LAB — LEUKOCYTE ALKALINE PHOSPHATASE: Leukocyte Alkaline Phos Stain: 126 (ref 30–140)

## 2011-05-07 ENCOUNTER — Ambulatory Visit
Admission: RE | Admit: 2011-05-07 | Discharge: 2011-05-07 | Disposition: A | Discharge status: Home / Self Care | Payer: PRIVATE HEALTH INSURANCE | Source: Ambulatory Visit | Attending: Family Medicine | Admitting: Family Medicine

## 2011-05-07 ENCOUNTER — Other Ambulatory Visit: Payer: Self-pay | Admitting: Family Medicine

## 2011-05-07 DIAGNOSIS — Z01818 Encounter for other preprocedural examination: Secondary | ICD-10-CM

## 2011-05-07 IMAGING — CR DG CHEST 2V
2 series · 2 of 2 positions shown · non-contrast
Comparison: Chest x-ray of [DATE]

CLINICAL DATA: Chest pain, preop for total knee replacement

CHEST - 2 VIEW

[w chest pa]
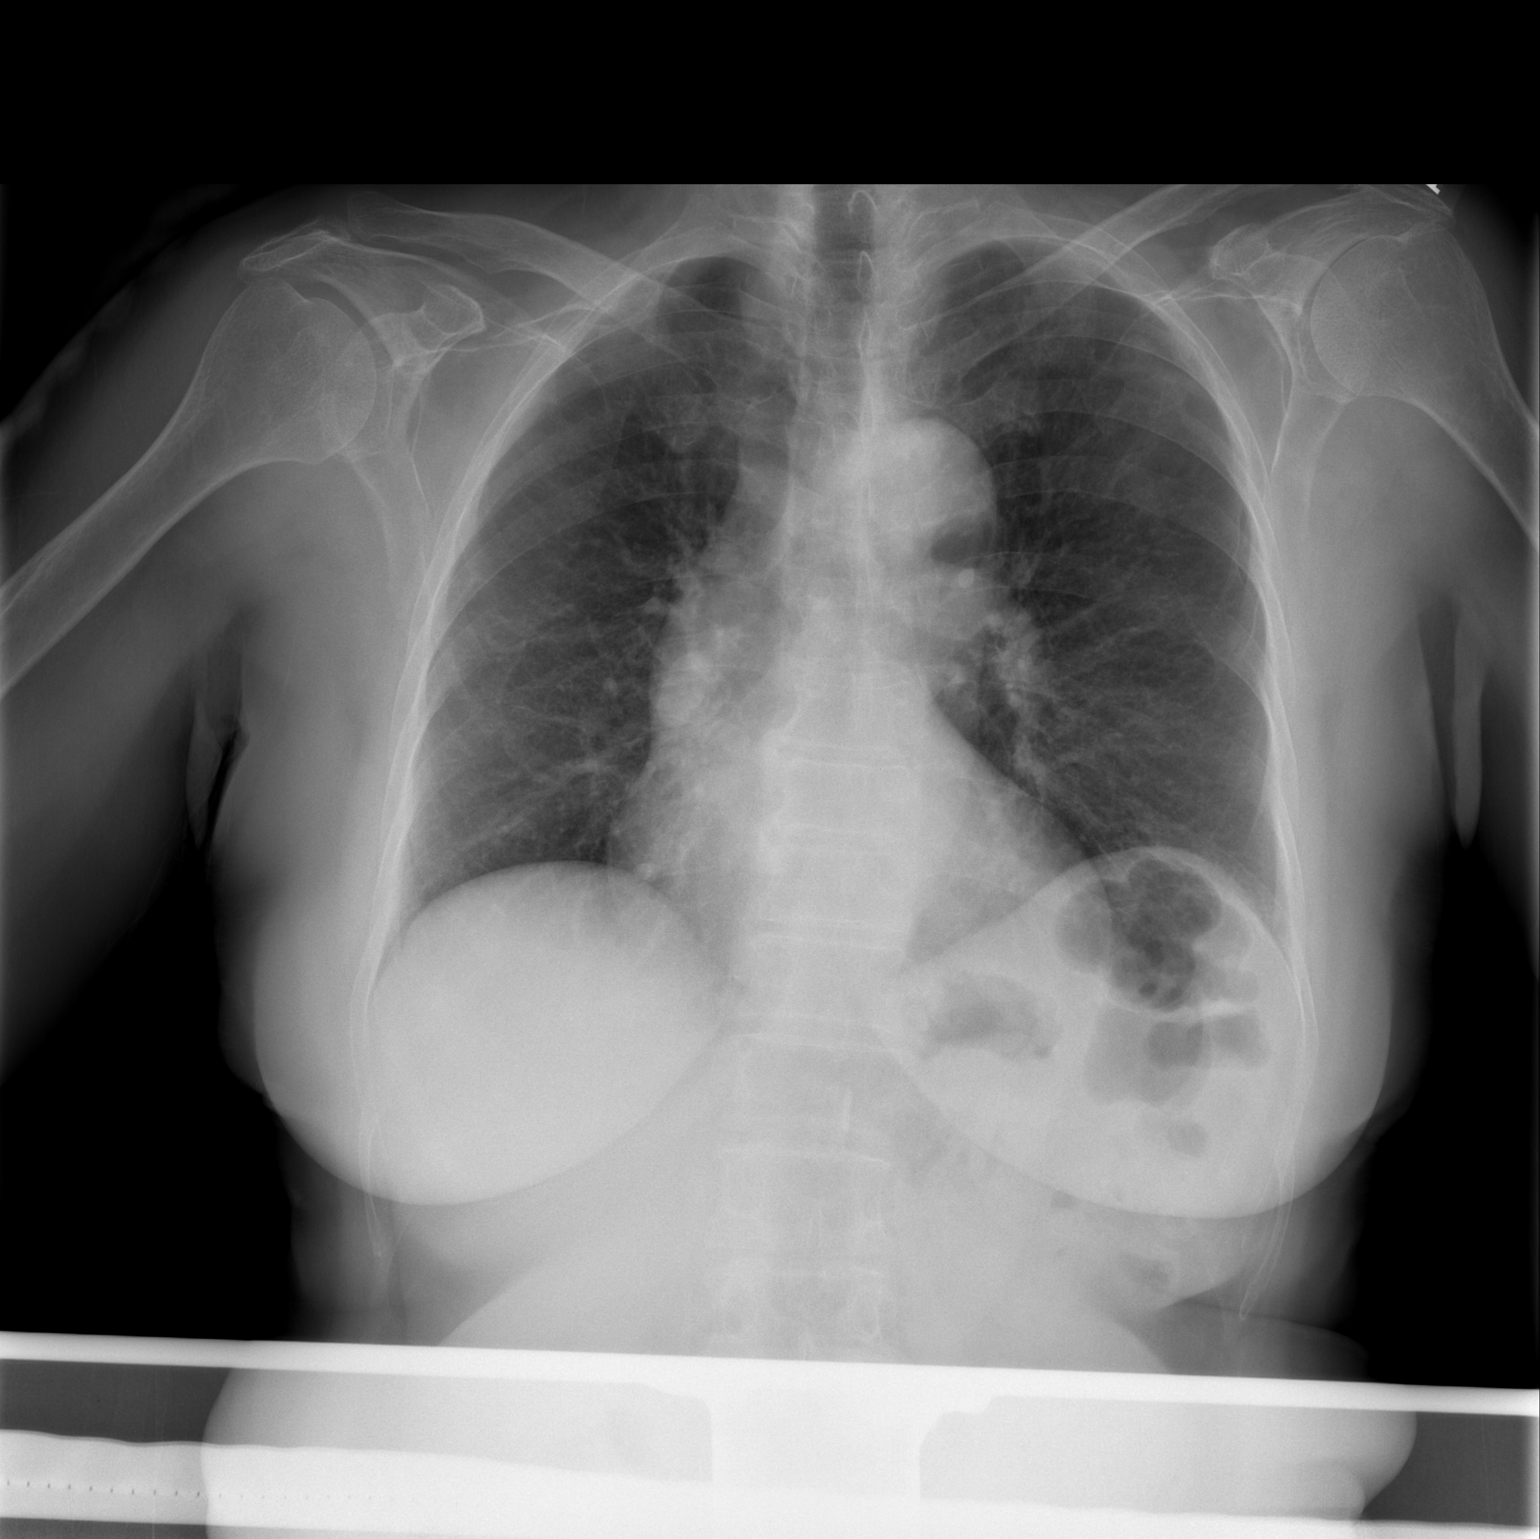

[w chest lat]
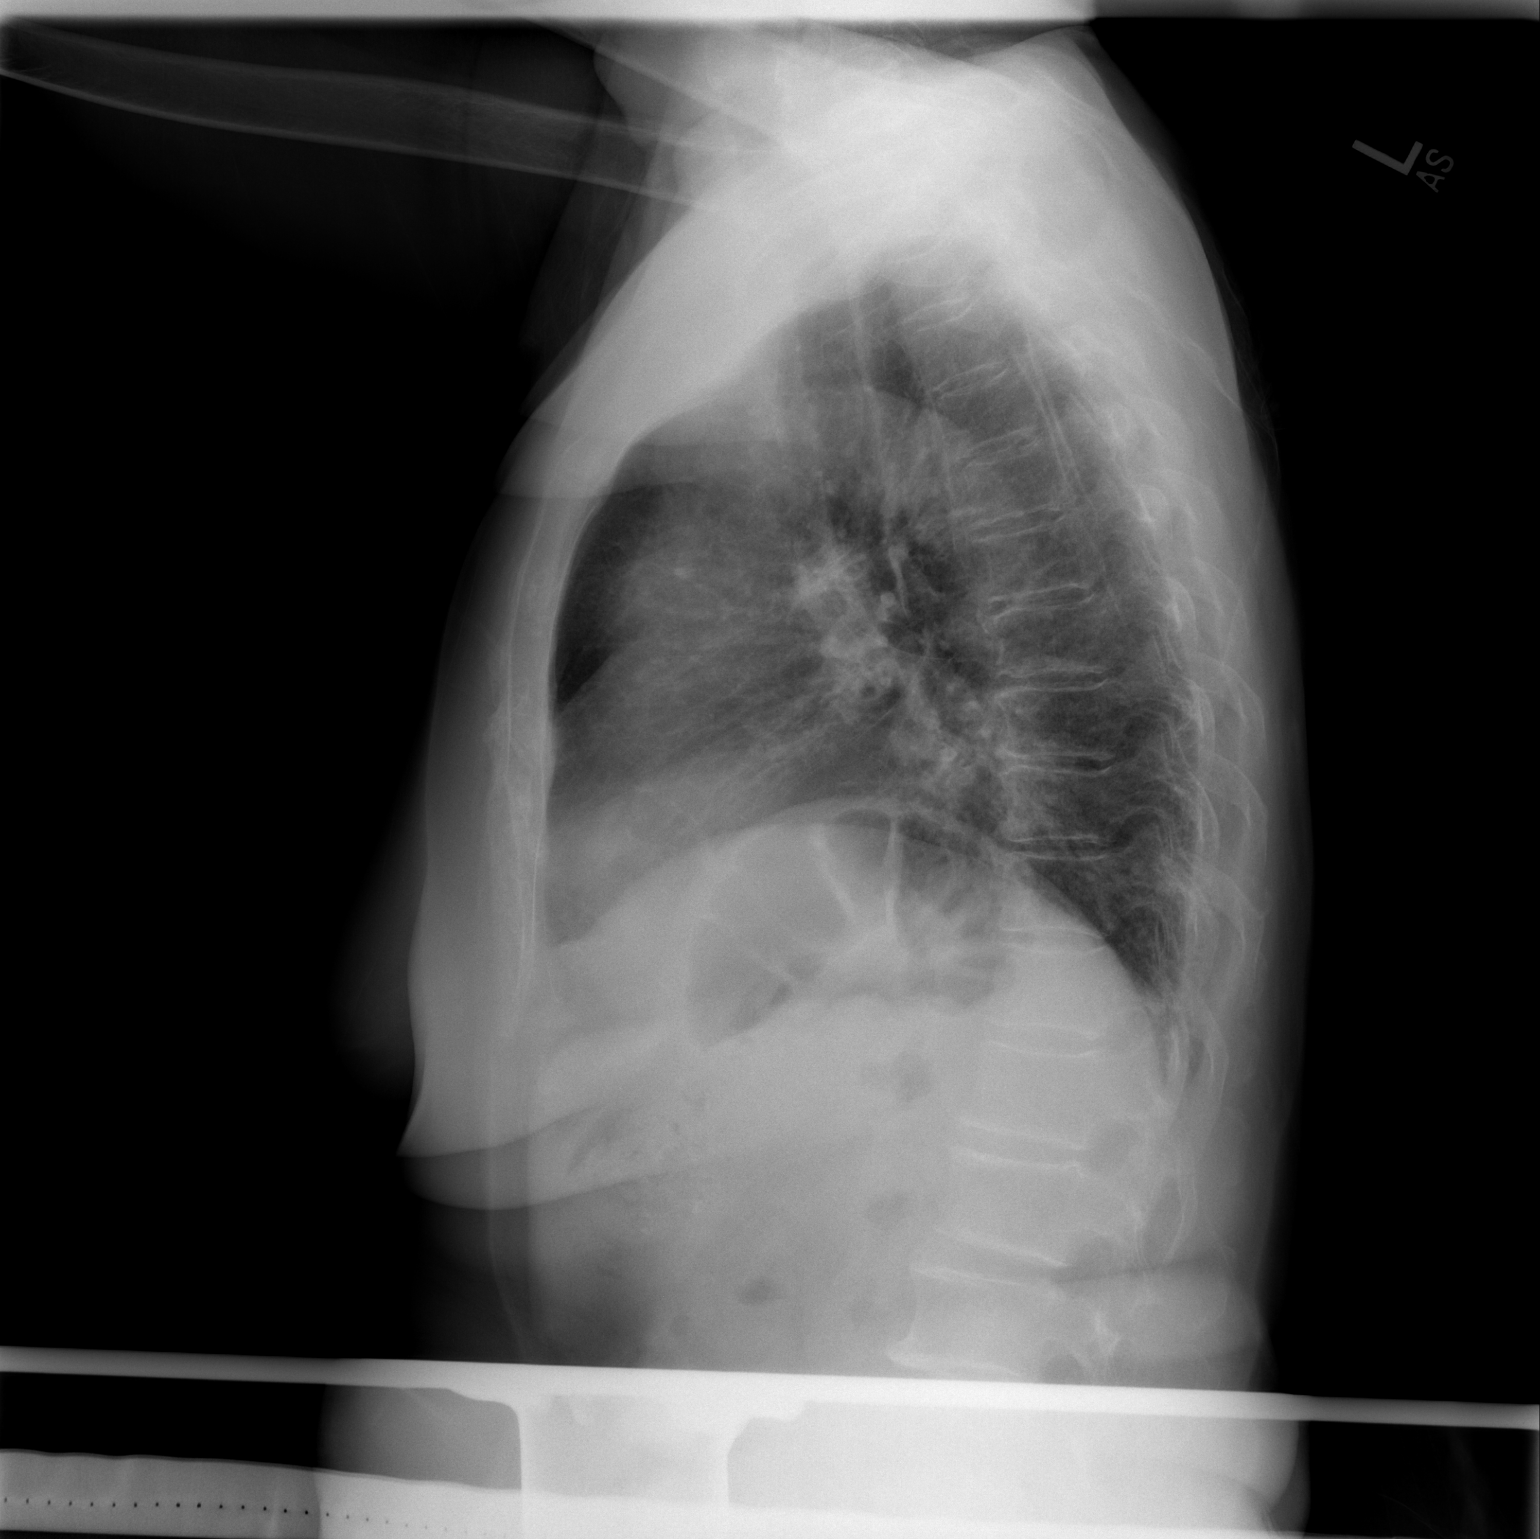

[2 of 2 positions shown; findings below may reference images not displayed]

FINDINGS: There is a vague opacity within the left upper lung
field.  Some of this opacity could be bony in origin or possibly
due to pleural thickening but this appears new.  Therefore CT of
the chest may be helpful to assess further. Pneumonia may also
create a similar appearance.  No effusion is seen.  Mediastinal
contours appear stable.  The heart is mildly enlarged and stable.
No acute bony abnormality is seen.
IMPRESSION: Vague opacity in the left upper lung field may be bony or
pleural, although pneumonia cannot be excluded.  Recommend CT of
the chest since this area appears new.

## 2011-05-07 IMAGING — CR DG RIBS 2V*R*
2 series · 2 of 2 positions shown · non-contrast
Comparison: Chest x-ray of [DATE]

CLINICAL DATA: Right anterior rib pain, history of a fall

RIGHT RIBS - 2 VIEW

[w ribs ap/pa lower right *]
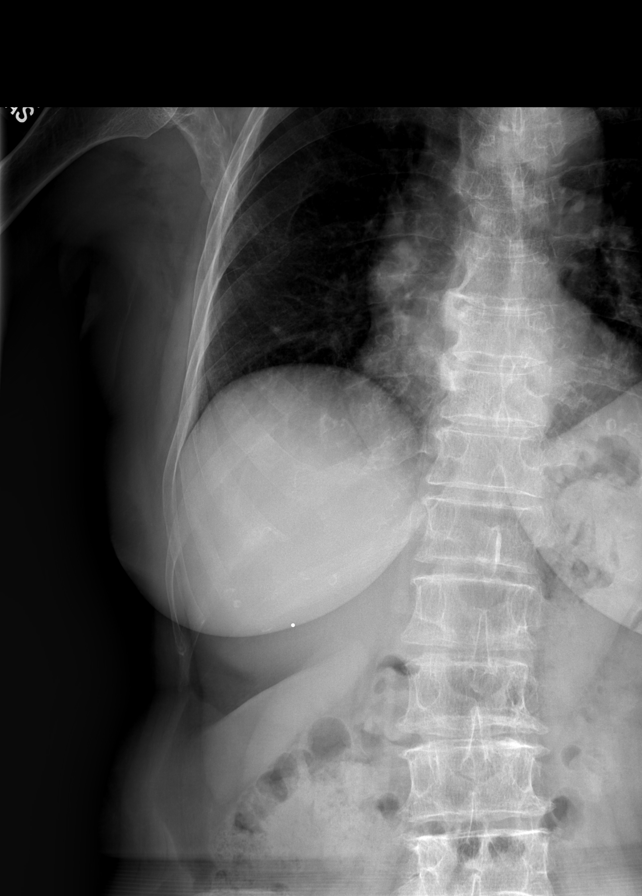

[w ribs oblique right *]
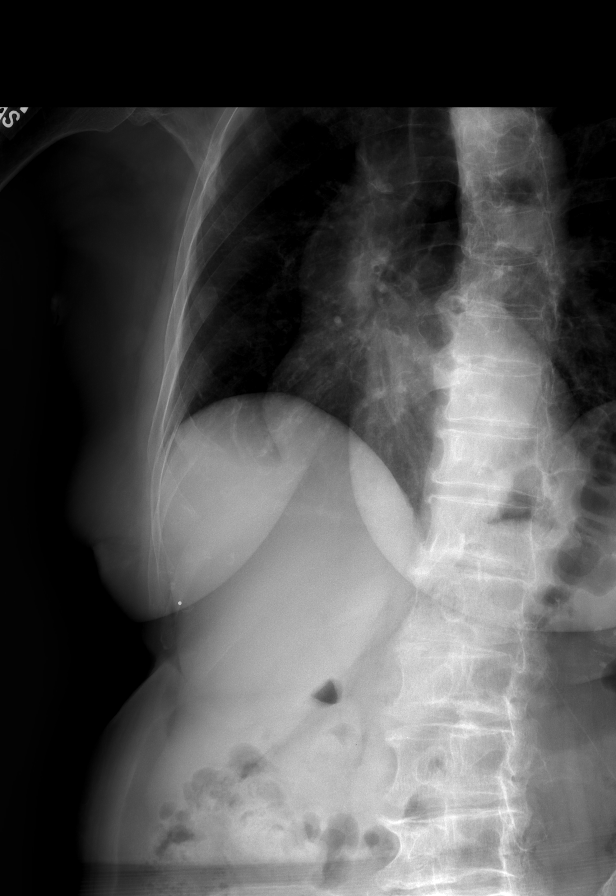

[2 of 2 positions shown; findings below may reference images not displayed]

FINDINGS: Right rib detail films were obtained.  No acute right rib
fracture is seen.  The bones are however very osteopenic.
IMPRESSION: No acute right rib fracture is seen.  Diffuse osteopenia.

## 2011-05-13 ENCOUNTER — Other Ambulatory Visit: Payer: Self-pay | Admitting: Family Medicine

## 2011-05-13 ENCOUNTER — Ambulatory Visit
Admission: RE | Admit: 2011-05-13 | Discharge: 2011-05-13 | Disposition: A | Discharge status: Home / Self Care | Payer: PRIVATE HEALTH INSURANCE | Source: Ambulatory Visit | Attending: Family Medicine | Admitting: Family Medicine

## 2011-05-13 DIAGNOSIS — R9389 Abnormal findings on diagnostic imaging of other specified body structures: Secondary | ICD-10-CM

## 2011-05-13 IMAGING — CT CT CHEST W/ CM
1 of 3 series · 15 of 33 positions shown, 19 images · IV contrast (75CC OMNI 300)
Comparison: Chest x-ray of [DATE]

CLINICAL DATA: Poorly defined opacity in the left upper lung field
on recent chest x-ray, occasional cough

CT CHEST WITH CONTRAST
TECHNIQUE: Multidetector CT imaging of the chest was performed
following the standard protocol during bolus administration of
intravenous contrast.
Contrast: 75mL OMNIPAQUE IOHEXOL 300 MG/ML  SOLN

[Series 2: chest with · axial · 0.59mm/px · z∈[-304,-64]mm · 15 of 52 slices shown, 19 images]
[im 2/52  mediastinal]
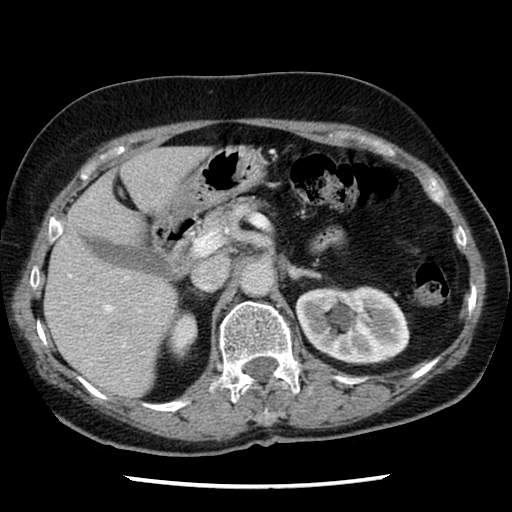
[im 2/52  lung]
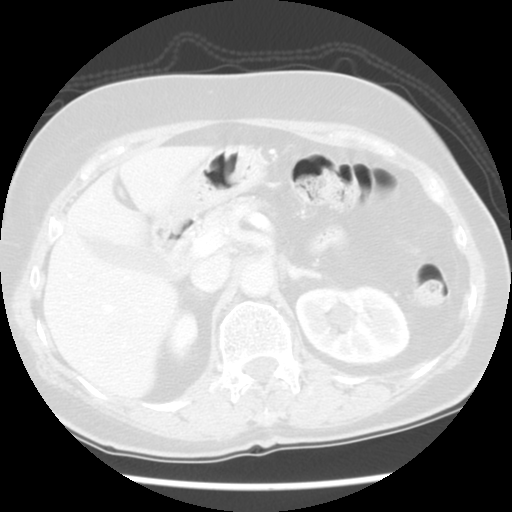
[im 6/52  lung]
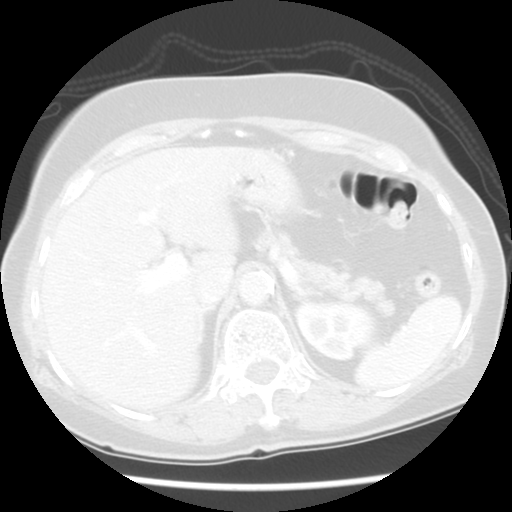
[im 10/52  lung]
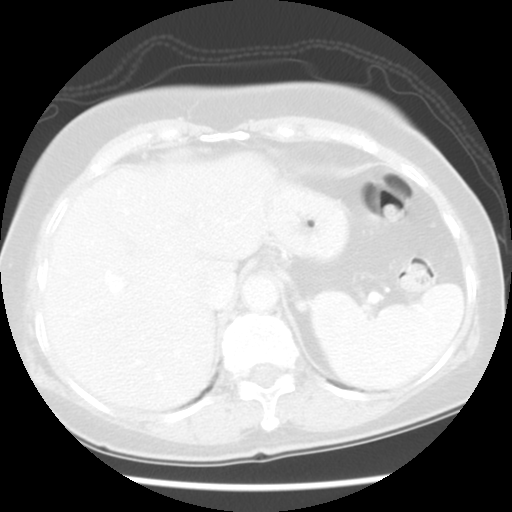
[im 14/52  lung]
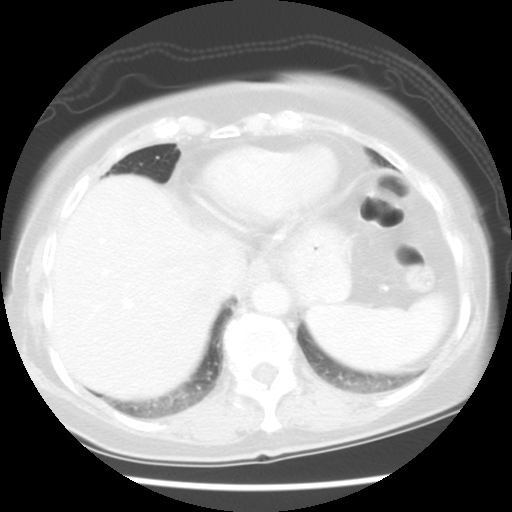
[im 16/52  mediastinal]
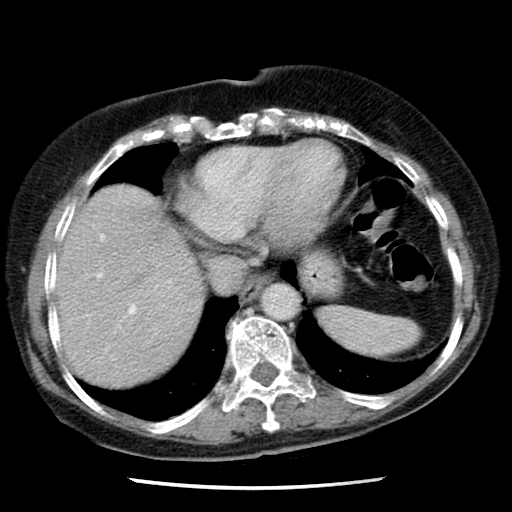
[im 16/52  lung]
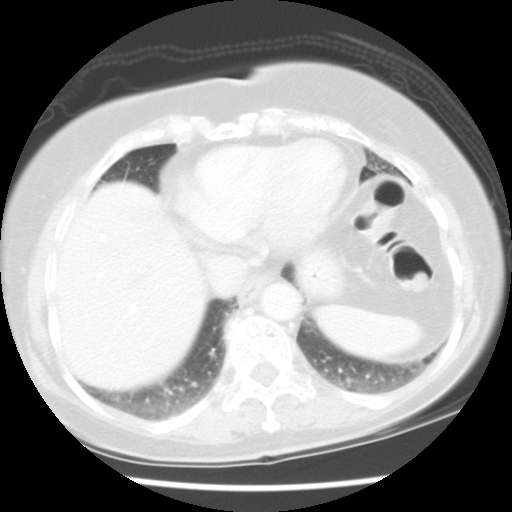
[im 19/52  lung]
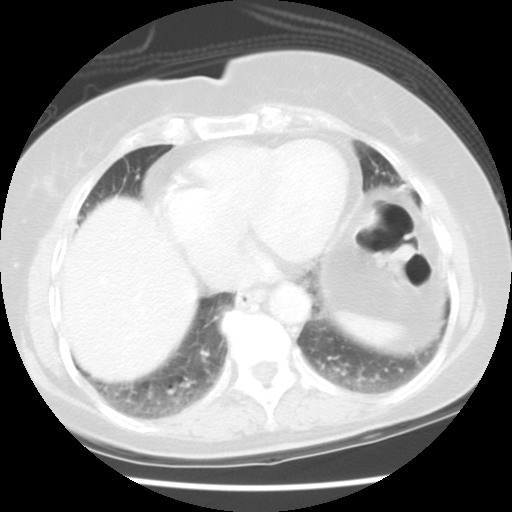
[im 23/52  lung]
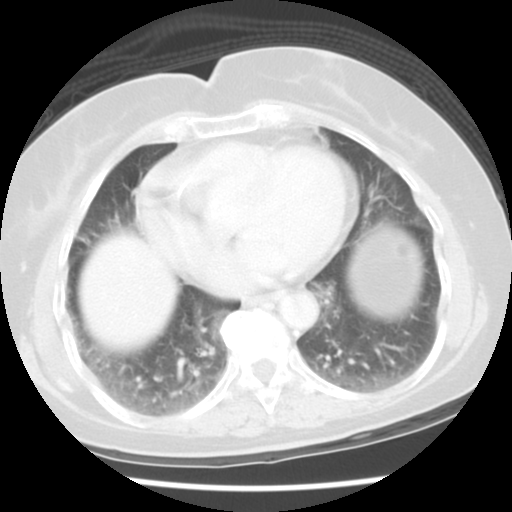
[im 27/52  lung]
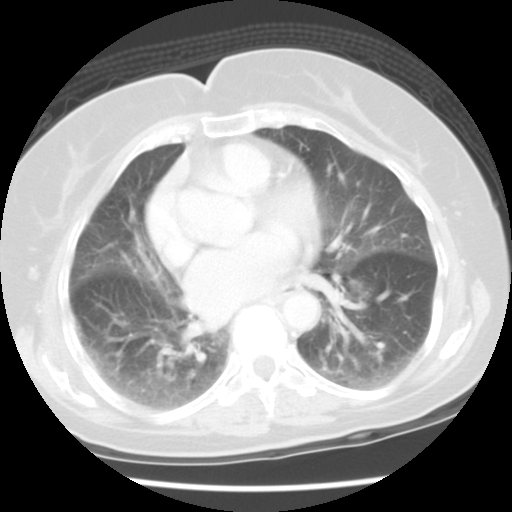
[im 29/52  mediastinal]
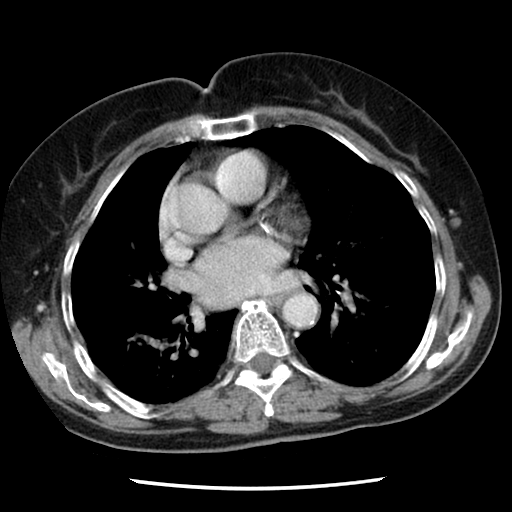
[im 29/52  lung]
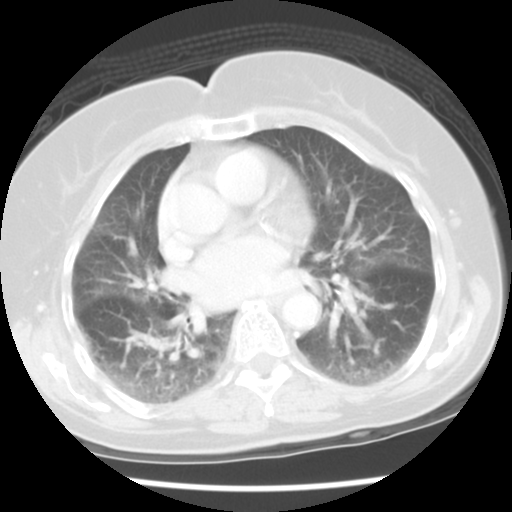
[im 33/52  lung]
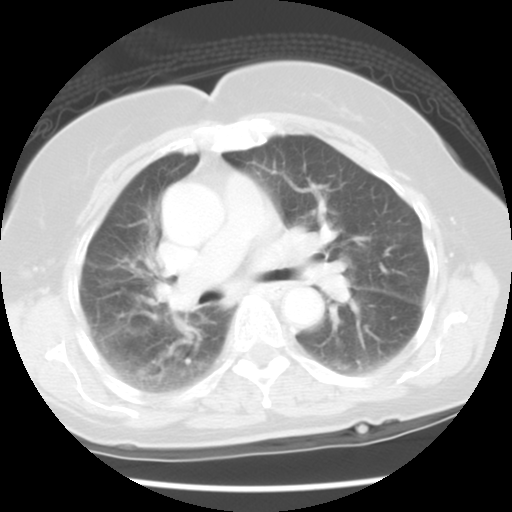
[im 36/52  lung]
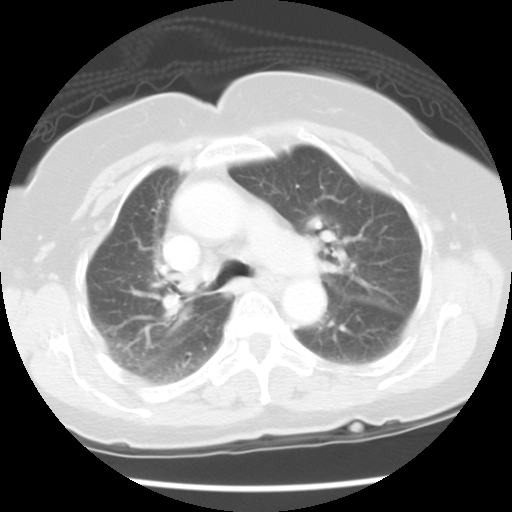
[im 38/52  lung]
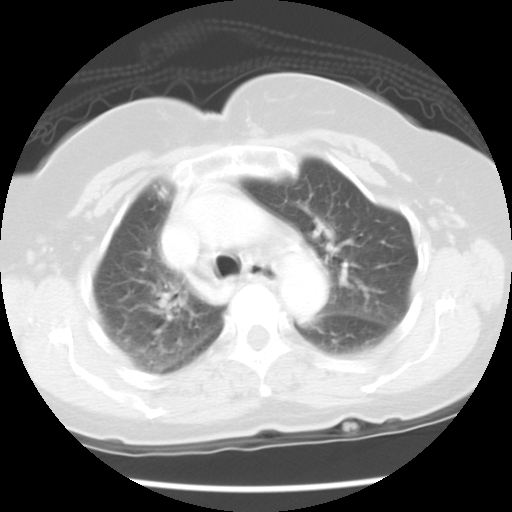
[im 42/52  mediastinal]
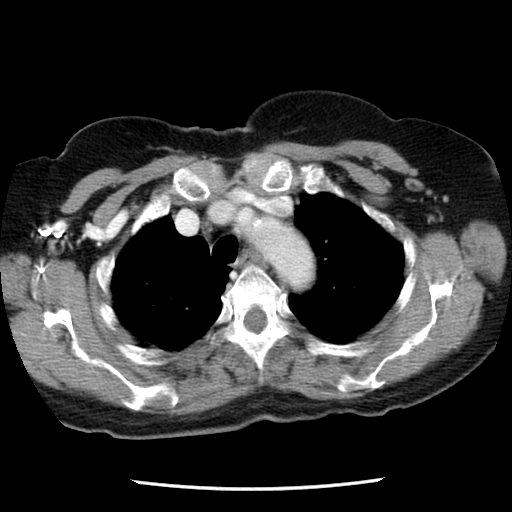
[im 42/52  lung]
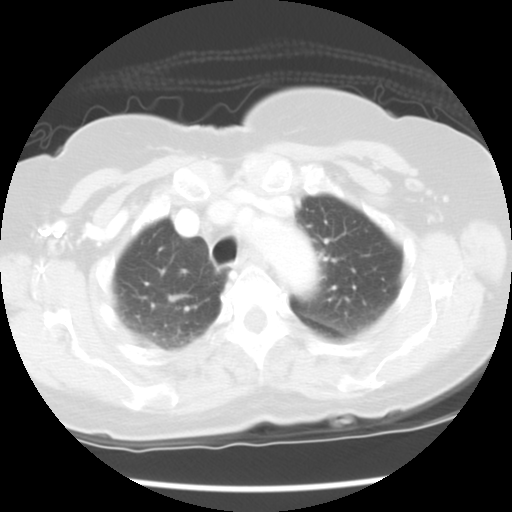
[im 46/52  lung]
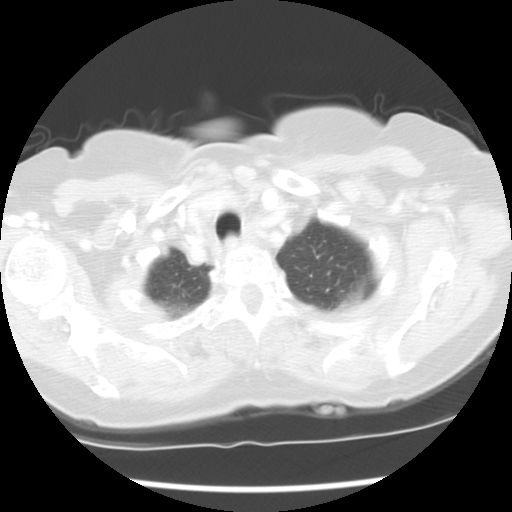
[im 50/52  lung]
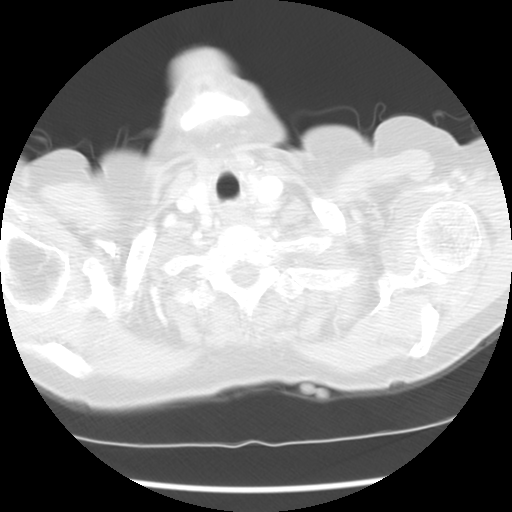

[15 of 33 positions shown; findings below may reference images not displayed]

FINDINGS: On the lung window images, no suspicious abnormality is
noted at the site in question.  No lung nodule is seen and no focal
infiltrate is noted.  There is no evidence of pleural effusion.
Mild bibasilar dependent atelectasis is present.

On soft tissue window images, the thyroid gland is normal in size.
The ascending aorta is slightly prominent measuring 3.7 cm in
diameter.  The caliber of the aortic arch and descending thoracic
aorta is normal.  The origins of the great vessels appear to be
patent.  The pulmonary arteries faintly opacify with no abnormality
noted.  There is some coronary artery calcification noted in the
region of the left circumflex coronary artery.  Mild cardiomegaly
is noted.  No mediastinal or hilar adenopathy is seen.  A few
borderline axillary lymph nodes are present.  The upper abdomen
that is visualized is unremarkable.
IMPRESSION: 1.  No significant abnormality is seen at the site questioned on
recent chest x-ray.  No suspicious lung nodule or mass is seen.
2.  Slightly prominent ascending aorta measuring 3.7 mm in
diameter.
3.  Mild cardiomegaly with faint coronary artery calcification in
the left circumflex artery.

## 2011-05-13 MED ORDER — IOHEXOL 300 MG/ML  SOLN
75.0000 mL | Freq: Once | INTRAMUSCULAR | Status: AC | PRN
  Administered 2011-05-13: 75 mL via INTRAVENOUS

## 2011-07-21 ENCOUNTER — Encounter (HOSPITAL_BASED_OUTPATIENT_CLINIC_OR_DEPARTMENT_OTHER): Payer: Self-pay | Admitting: *Deleted

## 2011-07-21 ENCOUNTER — Emergency Department (HOSPITAL_BASED_OUTPATIENT_CLINIC_OR_DEPARTMENT_OTHER)
Admission: EM | Admit: 2011-07-21 | Discharge: 2011-07-22 | Disposition: A | Discharge status: Home / Self Care | Payer: PRIVATE HEALTH INSURANCE | Attending: Emergency Medicine | Admitting: Emergency Medicine

## 2011-07-21 DIAGNOSIS — M129 Arthropathy, unspecified: Secondary | ICD-10-CM | POA: Insufficient documentation

## 2011-07-21 DIAGNOSIS — Z79899 Other long term (current) drug therapy: Secondary | ICD-10-CM | POA: Insufficient documentation

## 2011-07-21 DIAGNOSIS — I10 Essential (primary) hypertension: Secondary | ICD-10-CM | POA: Insufficient documentation

## 2011-07-21 DIAGNOSIS — E7800 Pure hypercholesterolemia, unspecified: Secondary | ICD-10-CM | POA: Insufficient documentation

## 2011-07-21 DIAGNOSIS — K219 Gastro-esophageal reflux disease without esophagitis: Secondary | ICD-10-CM | POA: Insufficient documentation

## 2011-07-21 DIAGNOSIS — T7840XA Allergy, unspecified, initial encounter: Secondary | ICD-10-CM

## 2011-07-21 DIAGNOSIS — M810 Age-related osteoporosis without current pathological fracture: Secondary | ICD-10-CM | POA: Insufficient documentation

## 2011-07-21 NOTE — ED Notes (Signed)
Pt began taking bactrim for a UTI on Friday pt began having lip swelling and mouth swelling since yesterday am pt continued to take her abx until 9 this PM denies resp difficulty

## 2011-07-22 LAB — URINALYSIS, ROUTINE W REFLEX MICROSCOPIC
Bilirubin Urine: NEGATIVE
Ketones, ur: NEGATIVE mg/dL
Leukocytes, UA: NEGATIVE
Nitrite: NEGATIVE
Protein, ur: NEGATIVE mg/dL

## 2011-07-22 MED ORDER — DIPHENHYDRAMINE HCL 50 MG/ML IJ SOLN
50.0000 mg | Freq: Once | INTRAMUSCULAR | Status: AC
  Administered 2011-07-22: 50 mg via INTRAVENOUS
  Filled 2011-07-22: qty 1

## 2011-07-22 MED ORDER — SODIUM CHLORIDE 0.9 % IV SOLN
Freq: Once | INTRAVENOUS | Status: AC
  Administered 2011-07-22: 01:00:00 via INTRAVENOUS

## 2011-07-22 MED ORDER — DIPHENHYDRAMINE HCL 25 MG PO CAPS: 50.0000 mg | ORAL_CAPSULE | Freq: Four times a day (QID) | ORAL | Status: DC | PRN

## 2011-07-22 MED ORDER — FAMOTIDINE IN NACL 20-0.9 MG/50ML-% IV SOLN
20.0000 mg | Freq: Once | INTRAVENOUS | Status: AC
  Administered 2011-07-22: 20 mg via INTRAVENOUS
  Filled 2011-07-22: qty 50

## 2011-07-22 NOTE — ED Notes (Signed)
Pt's son reports he spoke with pt's PCP tonight and her urine culture was negative so she is to stop antibiotics- EDP notified

## 2011-07-22 NOTE — ED Provider Notes (Signed)
History   This chart was scribed for Hanley Seamen, MD by Shari Heritage. The Katherine Novak was seen in room MH02/MH02. Katherine Novak's care was started at 2331.     CSN: [id number]  Arrival date & time 07/21/11  2331   First MD Initiated Contact with Katherine Novak 07/22/11 0023      Chief Complaint  Katherine Novak presents with  . Allergic Reaction    (Consider location/radiation/quality/duration/timing/severity/associated sxs/prior treatment) Katherine Novak is a 77 y.o. female presenting with allergic reaction. The history is provided by the Katherine Novak and a relative (Son was present during entire encounter. Translated as needed).  Allergic Reaction   Katherine Novak is a 77 y.o. female who presents to the Emergency Department complaining of swelling in the mouth, throat and lips onset yesterday morning. Katherine Novak has been taking Bactrim for the past 2 days for treatment of UTI. Katherine Novak denies wheezing, SOB, nausea, vomiting or diarrhea. Katherine Novak's son stated that Katherine Novak's PCP instructed them to come to the ED upon learning of her symptoms. Katherine Novak with medical h/o HTN, hypercholesterolemia, GERD, Barrett's esophagus, osteoporosis, arthritis, leukemia and abdominal aortic aneurysm. Katherine Novak has never smoked.  PCP - Mila Palmer  Past Medical History  Diagnosis Date  . Hypertension   . Hypercholesterolemia   . GERD (gastroesophageal reflux disease)   . Barrett's esophagus   . Vitamin d deficiency   . Osteoporosis   . Arthritis   . Leukemia   . AAA (abdominal aortic aneurysm)     Past Surgical History  Procedure Date  . Carpel tunnel-left   . Cataracts bilateral    Family History  Problem Relation Age of Onset  . Cancer Brother     History  Substance Use Topics  . Smoking status: Never Smoker   . Smokeless tobacco: Not on file  . Alcohol Use: No    OB History    Grav Para Term Preterm Abortions TAB SAB Ect Mult Living                  Review of Systems A complete 10 system review of systems  was obtained and all systems are negative except as noted in the HPI and PMH.   Allergies  Sulfa antibiotics  Home Medications   Current Outpatient Rx  Name Route Sig Dispense Refill  . ACETAMINOPHEN 325 MG PO TABS Oral Take 650 mg by mouth 2 (two) times daily.    . ALENDRONATE SODIUM 70 MG PO TABS Oral Take 70 mg by mouth every 7 (seven) days. Take with a full glass of water on an empty stomach.    Marland Kitchen VITAMIN D 1000 UNITS PO TABS Oral Take 2,000 Units by mouth daily.    Marland Kitchen DICLOFENAC SODIUM 1 % TD GEL Topical Apply 1 application topically 4 (four) times daily.    Marland Kitchen HYDROCODONE-ACETAMINOPHEN 5-500 MG PO TABS Oral Take 1 tablet by mouth every 6 (six) hours as needed.    . IBUPROFEN 200 MG PO TABS Oral Take 200 mg by mouth every 6 (six) hours as needed.    Marland Kitchen METOPROLOL TARTRATE 25 MG PO TABS Oral Take 25 mg by mouth 2 (two) times daily.    Marland Kitchen OMEPRAZOLE 20 MG PO CPDR Oral Take 20 mg by mouth daily.    Marland Kitchen ROSUVASTATIN CALCIUM 5 MG PO TABS Oral Take 5 mg by mouth daily.      BP 151/71  Pulse 82  Temp 98.2 F (36.8 C) (Oral)  Resp 18  SpO2 100%  Physical Exam  Constitutional:  Katherine Novak is oriented to person, place, and time. Katherine Novak appears well-developed and well-nourished.  HENT:  Head: Atraumatic.       Mild angioedema of the lips.  Eyes: Conjunctivae and EOM are normal. Pupils are equal, round, and reactive to light.       Arcus senilis present.  Cardiovascular: Normal rate, regular rhythm and intact distal pulses.   Pulmonary/Chest: Effort normal and breath sounds normal.  Abdominal: Soft. Bowel sounds are normal. Katherine Novak exhibits no mass. There is no tenderness. There is no rebound and no guarding.  Musculoskeletal: Katherine Novak exhibits edema (Trace edema of lower legs bilaterally).  Neurological: Katherine Novak is alert and oriented to person, place, and time.  Skin: Skin is warm and dry.  Psychiatric: Katherine Novak has a normal mood and affect.    ED Course  Procedures (including critical care time) DIAGNOSTIC  STUDIES: Oxygen Saturation is 100% on room air, normal by my interpretation.    COORDINATION OF CARE: 12:24AM- Katherine Novak informed of current plan for treatment and evaluation and agrees with plan at this time.     MDM   Nursing notes and vitals signs, including pulse oximetry, reviewed.  Summary of this visit's results, reviewed by myself:  Labs:  Results for orders placed during the hospital encounter of 07/21/11  URINALYSIS, ROUTINE W REFLEX MICROSCOPIC      Component Value Range   Color, Urine YELLOW  YELLOW   APPearance CLEAR  CLEAR   Specific Gravity, Urine 1.007  1.005 - 1.030   pH 6.0  5.0 - 8.0   Glucose, UA NEGATIVE  NEGATIVE mg/dL   Hgb urine dipstick NEGATIVE  NEGATIVE   Bilirubin Urine NEGATIVE  NEGATIVE   Ketones, ur NEGATIVE  NEGATIVE mg/dL   Protein, ur NEGATIVE  NEGATIVE mg/dL   Urobilinogen, UA 0.2  0.0 - 1.0 mg/dL   Nitrite NEGATIVE  NEGATIVE   Leukocytes, UA NEGATIVE  NEGATIVE   2:53 AM Symptoms improved with IV Benadryl and Pepcid. Katherine Novak was advised to discontinue Bactrim as there is no evidence of urinary tract infection. Katherine Novak should avoid all sulfa drugs in the future.        I personally performed the services described in this documentation, which was scribed in my presence.  The recorded information has been reviewed and considered.   Hanley Seamen, MD 07/22/11 [phone]

## 2011-07-22 NOTE — ED Notes (Signed)
rx x 1 given for benadryl- pt instructed to stop taking bactrim

## 2011-10-17 ENCOUNTER — Ambulatory Visit: Payer: PRIVATE HEALTH INSURANCE | Admitting: Internal Medicine

## 2011-10-17 ENCOUNTER — Telehealth: Payer: Self-pay | Admitting: Internal Medicine

## 2011-10-17 NOTE — Telephone Encounter (Signed)
called to cx appt and will c/b to r/s   aom

## 2011-10-21 ENCOUNTER — Other Ambulatory Visit: Payer: PRIVATE HEALTH INSURANCE | Admitting: Lab

## 2011-10-21 ENCOUNTER — Ambulatory Visit: Payer: PRIVATE HEALTH INSURANCE | Admitting: Internal Medicine

## 2012-02-11 ENCOUNTER — Other Ambulatory Visit: Payer: Self-pay | Admitting: Family Medicine

## 2012-02-11 DIAGNOSIS — Z1231 Encounter for screening mammogram for malignant neoplasm of breast: Secondary | ICD-10-CM

## 2012-05-20 ENCOUNTER — Telehealth: Payer: Self-pay | Admitting: Medical Oncology

## 2012-05-20 NOTE — Telephone Encounter (Signed)
Pts son called to request medical clearance . I called Dr Hurman Horn office to send a request.

## 2012-05-22 ENCOUNTER — Telehealth: Payer: Self-pay | Admitting: *Deleted

## 2012-05-22 NOTE — Telephone Encounter (Signed)
Letter for medical clearance faxed to Dr [facility] office at [phone].  SLJ

## 2012-05-25 ENCOUNTER — Telehealth: Payer: Self-pay | Admitting: *Deleted

## 2012-05-25 NOTE — Telephone Encounter (Signed)
Progress note dated 05/20/12 from Dr Jerl Santos at [facility] given to Dr Donnald Garre to review.  SLJ

## 2012-06-03 ENCOUNTER — Other Ambulatory Visit: Payer: Self-pay | Admitting: Orthopaedic Surgery

## 2012-06-11 ENCOUNTER — Ambulatory Visit
Admission: RE | Admit: 2012-06-11 | Discharge: 2012-06-11 | Disposition: A | Discharge status: Home / Self Care | Payer: PRIVATE HEALTH INSURANCE | Source: Ambulatory Visit | Attending: Family Medicine | Admitting: Family Medicine

## 2012-06-11 DIAGNOSIS — Z1231 Encounter for screening mammogram for malignant neoplasm of breast: Secondary | ICD-10-CM

## 2012-06-12 ENCOUNTER — Encounter (HOSPITAL_COMMUNITY): Payer: Self-pay | Admitting: Pharmacy Technician

## 2012-06-17 ENCOUNTER — Encounter (HOSPITAL_COMMUNITY)
Admission: RE | Admit: 2012-06-17 | Discharge: 2012-06-17 | Disposition: A | Discharge status: Home / Self Care | Payer: Medicare Other | Source: Ambulatory Visit | Attending: Orthopaedic Surgery | Admitting: Orthopaedic Surgery

## 2012-06-17 ENCOUNTER — Encounter (HOSPITAL_COMMUNITY): Payer: Self-pay

## 2012-06-17 DIAGNOSIS — Z01818 Encounter for other preprocedural examination: Secondary | ICD-10-CM | POA: Insufficient documentation

## 2012-06-17 LAB — CBC WITH DIFFERENTIAL/PLATELET
Eosinophils Relative: 1 % (ref 0–5)
HCT: 35.1 % — ABNORMAL LOW (ref 36.0–46.0)
Lymphs Abs: 10.2 10*3/uL — ABNORMAL HIGH (ref 0.7–4.0)
MCH: 26.7 pg (ref 26.0–34.0)
MCV: 84.4 fL (ref 78.0–100.0)
Monocytes Absolute: 0.3 10*3/uL (ref 0.1–1.0)
Monocytes Relative: 2 % — ABNORMAL LOW (ref 3–12)
Neutro Abs: 3.5 10*3/uL (ref 1.7–7.7)
Platelets: 164 10*3/uL (ref 150–400)
RBC: 4.16 MIL/uL (ref 3.87–5.11)
WBC: 14.1 10*3/uL — ABNORMAL HIGH (ref 4.0–10.5)

## 2012-06-17 LAB — TYPE AND SCREEN
ABO/RH(D): O POS
Antibody Screen: NEGATIVE

## 2012-06-17 LAB — URINALYSIS, ROUTINE W REFLEX MICROSCOPIC
Leukocytes, UA: NEGATIVE
Nitrite: NEGATIVE
Protein, ur: NEGATIVE mg/dL
Specific Gravity, Urine: 1.01 (ref 1.005–1.030)
Urobilinogen, UA: 0.2 mg/dL (ref 0.0–1.0)

## 2012-06-17 LAB — BASIC METABOLIC PANEL
CO2: 26 mEq/L (ref 19–32)
Calcium: 9.3 mg/dL (ref 8.4–10.5)
Chloride: 102 mEq/L (ref 96–112)
Creatinine, Ser: 0.66 mg/dL (ref 0.50–1.10)
Glucose, Bld: 98 mg/dL (ref 70–99)
Sodium: 137 mEq/L (ref 135–145)

## 2012-06-17 LAB — ABO/RH: ABO/RH(D): O POS

## 2012-06-17 NOTE — H&P (Signed)
TOTAL KNEE ADMISSION H&P  Patient is being admitted for left total knee arthroplasty.  Subjective:  Chief Complaint:left knee pain.  HPI: Katherine Novak, 78 y.o. female, has a history of pain and functional disability in the left knee due to arthritis and has failed non-surgical conservative treatments for greater than 12 weeks to includeNSAID's and/or analgesics, corticosteriod injections, weight reduction as appropriate and activity modification.  Onset of symptoms was gradual, starting 5 years ago with gradually worsening course since that time. The patient noted no past surgery on the left knee(s).  Patient currently rates pain in the left knee(s) at 8 out of 10 with activity. Patient has night pain, worsening of pain with activity and weight bearing, pain that interferes with activities of daily living and pain with passive range of motion.  Patient has evidence of subchondral sclerosis, periarticular osteophytes and joint space narrowing by imaging studies. This patient has had none. There is no active infection.  Patient Active Problem List   Diagnosis Date Noted  . Hypercholesterolemia    Past Medical History  Diagnosis Date  . Hypertension   . Hypercholesterolemia   . GERD (gastroesophageal reflux disease)   . Barrett's esophagus   . Vitamin d deficiency   . Osteoporosis   . Arthritis   . Leukemia   . AAA (abdominal aortic aneurysm)     Past Surgical History  Procedure Laterality Date  . Carpel tunnel-left    . Cataracts  bilateral    No prescriptions prior to admission   Allergies  Allergen Reactions  . Sulfa Antibiotics     History  Substance Use Topics  . Smoking status: Never Smoker   . Smokeless tobacco: Not on file  . Alcohol Use: No    Family History  Problem Relation Age of Onset  . Cancer Brother      Review of Systems  Endo/Heme/Allergies:       CLL  All other systems reviewed and are negative.    Objective:  Physical Exam  Constitutional:  She is oriented to person, place, and time. She appears well-nourished.  HENT:  Head: Atraumatic.  Eyes: Pupils are equal, round, and reactive to light.  Neck: Normal range of motion.  Cardiovascular: Normal rate.   Respiratory: Effort normal.  GI: Bowel sounds are normal.  Musculoskeletal:  Left knee exam: Range of motion 5-95.  Medial joint line pain and to a lesser degree lateral joint line pain.  No fluid.  Neurological: She is oriented to person, place, and time. She has normal reflexes.  Skin: Skin is dry.  Psychiatric: She has a normal mood and affect.    Vital signs in last 24 hours:    Labs:   Estimated body mass index is 27.19 kg/(m^2) as calculated from the following:   Height as of 04/23/11: 5' 0.25" (1.53 m).   Weight as of 04/23/11: 63.64 kg (140 lb 4.8 oz).   Imaging Review Plain radiographs demonstrate severe degenerative joint disease of the left knee(s). The overall alignment isneutral. The bone quality appears to be good for age and reported activity level.  Assessment/Plan:  End stage arthritis, left knee   The patient history, physical examination, clinical judgment of the provider and imaging studies are consistent with end stage degenerative joint disease of the left knee(s) and total knee arthroplasty is deemed medically necessary. The treatment options including medical management, injection therapy arthroscopy and arthroplasty were discussed at length. The risks and benefits of total knee arthroplasty were presented and  reviewed. The risks due to aseptic loosening, infection, stiffness, patella tracking problems, thromboembolic complications and other imponderables were discussed. The patient acknowledged the explanation, agreed to proceed with the plan and consent was signed. Patient is being admitted for inpatient treatment for surgery, pain control, PT, OT, prophylactic antibiotics, VTE prophylaxis, progressive ambulation and ADL's and discharge planning.  The patient is planning to be discharged home with home health services

## 2012-06-17 NOTE — Pre-Procedure Instructions (Addendum)
Katherine Novak  06/17/2012   Your procedure is scheduled on:  06/23/12  Report to [facility] at 1130AM.  Call this number if you have problems the morning of surgery: [phone]   Remember:   Do not eat food or drink liquids after midnight.   Take these medicines the morning of surgery with A SIP OF WATER: metoprolol, omeprazole  STOP ibuprofen, voltaren,pain med tomorrow 06/18/12   Do not wear jewelry, make-up or nail polish.  Do not wear lotions, powders, or perfumes. You may wear deodorant.  Do not shave 48 hours prior to surgery. Men may shave face and neck.  Do not bring valuables to the hospital.  [facility] is not responsible                   for any belongings or valuables.  Contacts, dentures or bridgework may not be worn into surgery.  Leave suitcase in the car. After surgery it may be brought to your room.  For patients admitted to the hospital, checkout time is 11:00 AM the day of  discharge.   Patients discharged the day of surgery will not be allowed to drive  home.  Name and phone number of your driver:   Special Instructions: Incentive Spirometry - Practice and bring it with you on the day of surgery. Shower using CHG 2 nights before surgery and the night before surgery.  If you shower the day of surgery use CHG.  Use special wash - you have one bottle of CHG for all showers.  You should use approximately 1/3 of the bottle for each shower.   Please read over the following fact sheets that you were given: Pain Booklet, Coughing and Deep Breathing, Blood Transfusion Information, Total Joint Packet, MRSA Information and Surgical Site Infection Prevention

## 2012-06-18 LAB — PATHOLOGIST SMEAR REVIEW

## 2012-06-22 MED ORDER — CEFAZOLIN SODIUM-DEXTROSE 2-3 GM-% IV SOLR
2.0000 g | INTRAVENOUS | Status: AC
  Administered 2012-06-23: 2 g via INTRAVENOUS
  Filled 2012-06-22: qty 50

## 2012-06-22 NOTE — Progress Notes (Signed)
Patient's son notified of new arrival time

## 2012-06-23 ENCOUNTER — Encounter (HOSPITAL_COMMUNITY): Payer: Self-pay | Admitting: *Deleted

## 2012-06-23 ENCOUNTER — Encounter (HOSPITAL_COMMUNITY): Payer: Self-pay | Admitting: Anesthesiology

## 2012-06-23 ENCOUNTER — Encounter (HOSPITAL_COMMUNITY)
Admission: RE | Disposition: A | Discharge status: Home Health | Payer: Self-pay | Source: Ambulatory Visit | Attending: Orthopaedic Surgery

## 2012-06-23 ENCOUNTER — Inpatient Hospital Stay (HOSPITAL_COMMUNITY): Payer: PRIVATE HEALTH INSURANCE

## 2012-06-23 ENCOUNTER — Inpatient Hospital Stay (HOSPITAL_COMMUNITY): Payer: PRIVATE HEALTH INSURANCE | Admitting: Anesthesiology

## 2012-06-23 ENCOUNTER — Inpatient Hospital Stay (HOSPITAL_COMMUNITY)
Admission: RE | Admit: 2012-06-23 | Discharge: 2012-06-25 | DRG: 470 | Disposition: A | Discharge status: Home Health | Payer: PRIVATE HEALTH INSURANCE | Source: Ambulatory Visit | Attending: Orthopaedic Surgery | Admitting: Orthopaedic Surgery

## 2012-06-23 DIAGNOSIS — I714 Abdominal aortic aneurysm, without rupture, unspecified: Secondary | ICD-10-CM | POA: Diagnosis present

## 2012-06-23 DIAGNOSIS — Z882 Allergy status to sulfonamides status: Secondary | ICD-10-CM

## 2012-06-23 DIAGNOSIS — M1712 Unilateral primary osteoarthritis, left knee: Secondary | ICD-10-CM

## 2012-06-23 DIAGNOSIS — K219 Gastro-esophageal reflux disease without esophagitis: Secondary | ICD-10-CM | POA: Diagnosis present

## 2012-06-23 DIAGNOSIS — E559 Vitamin D deficiency, unspecified: Secondary | ICD-10-CM | POA: Diagnosis present

## 2012-06-23 DIAGNOSIS — C9110 Chronic lymphocytic leukemia of B-cell type not having achieved remission: Secondary | ICD-10-CM | POA: Diagnosis present

## 2012-06-23 DIAGNOSIS — E7800 Pure hypercholesterolemia, unspecified: Secondary | ICD-10-CM | POA: Diagnosis present

## 2012-06-23 DIAGNOSIS — M810 Age-related osteoporosis without current pathological fracture: Secondary | ICD-10-CM | POA: Diagnosis present

## 2012-06-23 DIAGNOSIS — Z7982 Long term (current) use of aspirin: Secondary | ICD-10-CM

## 2012-06-23 DIAGNOSIS — I10 Essential (primary) hypertension: Secondary | ICD-10-CM | POA: Diagnosis present

## 2012-06-23 DIAGNOSIS — Z79899 Other long term (current) drug therapy: Secondary | ICD-10-CM

## 2012-06-23 DIAGNOSIS — M1710 Unilateral primary osteoarthritis, unspecified knee: Principal | ICD-10-CM | POA: Diagnosis present

## 2012-06-23 HISTORY — PX: TOTAL KNEE ARTHROPLASTY: SHX125

## 2012-06-23 IMAGING — CR DG CHEST 2V
2 series · 2 of 2 positions shown · non-contrast
Comparison: [DATE]

CLINICAL DATA: Preoperative evaluation for knee replacement.
History of hypertension

CHEST - 2 VIEW

[view not recorded (1 of 2)]
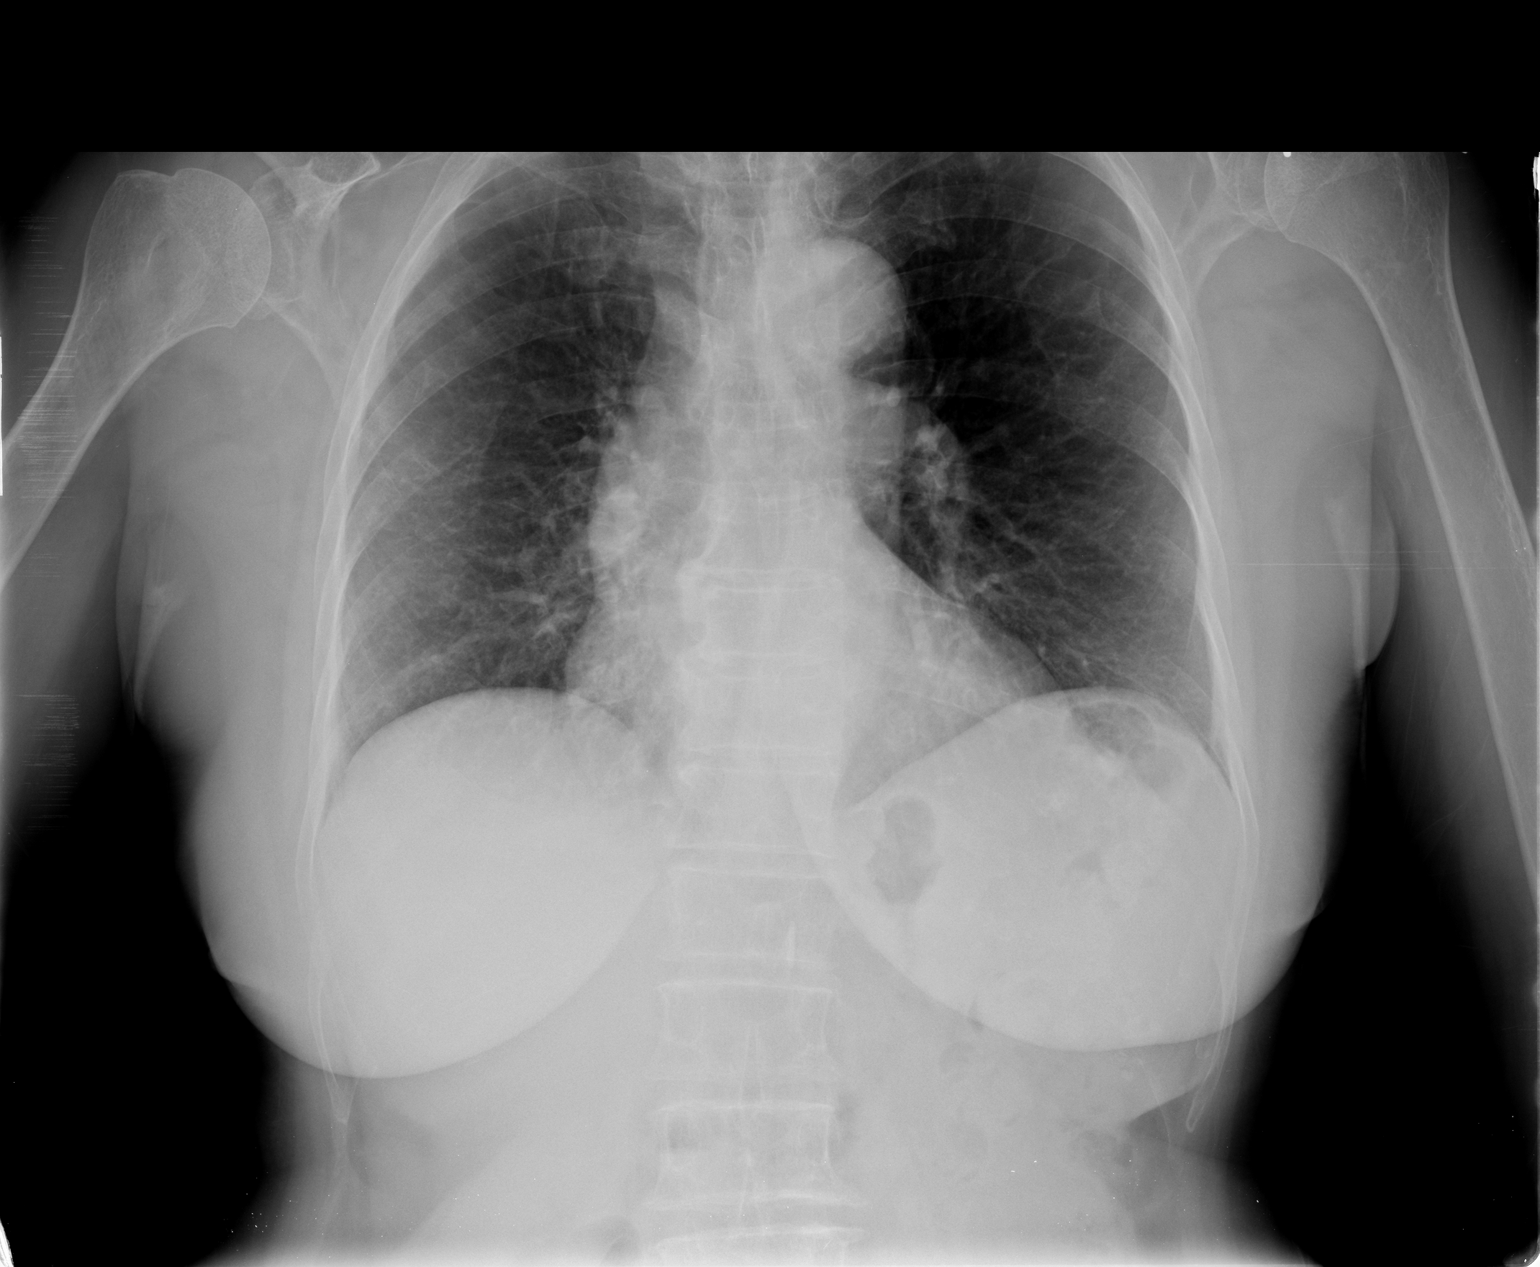

[view not recorded (2 of 2)]
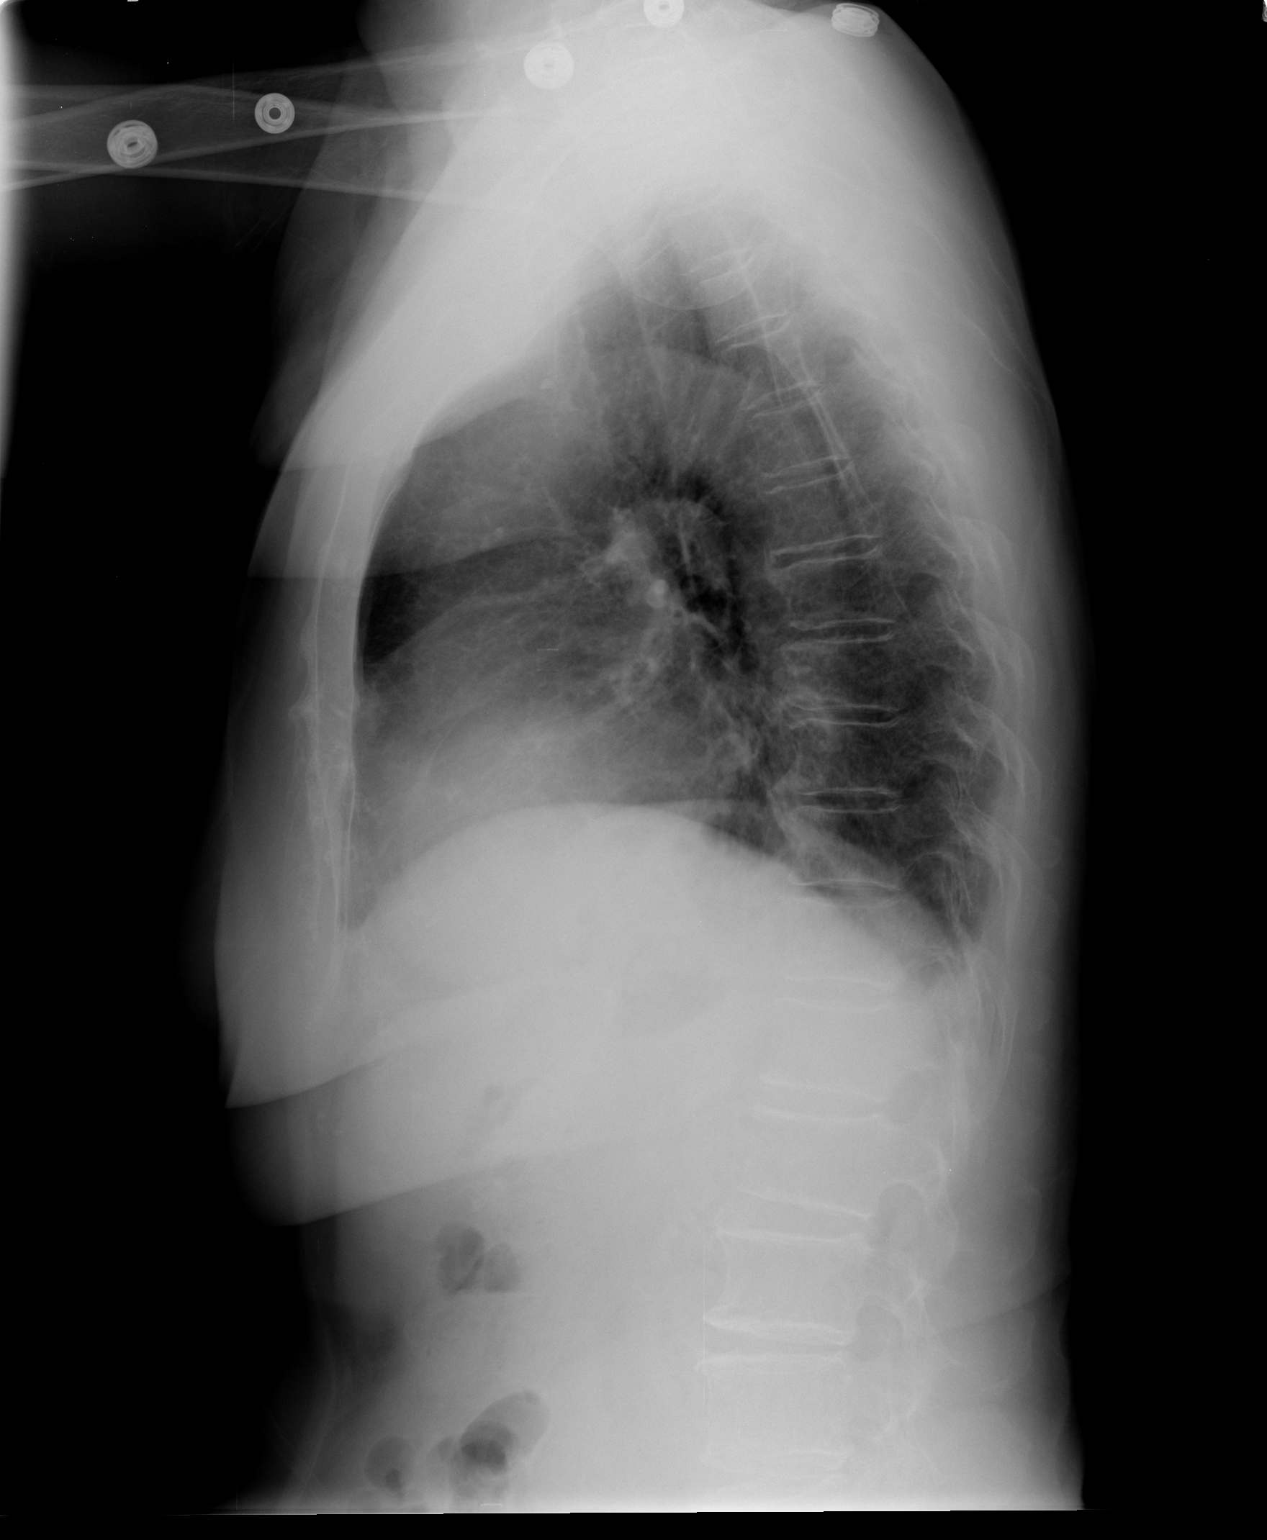

[2 of 2 positions shown; findings below may reference images not displayed]

FINDINGS: The cardiac shadow is stable.  The lungs are well-
aerated.  Mild interstitial changes are again seen.  The previously
seen nodular density is less well visualized.  Mild degenerate
change of the thoracic spine is seen.
IMPRESSION: No acute abnormality noted.

## 2012-06-23 SURGERY — ARTHROPLASTY, KNEE, TOTAL
Anesthesia: Monitor Anesthesia Care | Site: Knee | Laterality: Left | Wound class: Clean

## 2012-06-23 MED ORDER — METOPROLOL TARTRATE 25 MG PO TABS
25.0000 mg | ORAL_TABLET | Freq: Two times a day (BID) | ORAL | Status: DC
  Administered 2012-06-24 – 2012-06-25 (×3): 25 mg via ORAL
  Filled 2012-06-23 (×5): qty 1

## 2012-06-23 MED ORDER — PANTOPRAZOLE SODIUM 40 MG PO TBEC
40.0000 mg | DELAYED_RELEASE_TABLET | Freq: Every day | ORAL | Status: DC
  Administered 2012-06-23 – 2012-06-25 (×3): 40 mg via ORAL
  Filled 2012-06-23 (×3): qty 1

## 2012-06-23 MED ORDER — ALUM & MAG HYDROXIDE-SIMETH 200-200-20 MG/5ML PO SUSP: 30.0000 mL | ORAL | Status: DC | PRN

## 2012-06-23 MED ORDER — METHOCARBAMOL 500 MG PO TABS
500.0000 mg | ORAL_TABLET | Freq: Four times a day (QID) | ORAL | Status: DC | PRN
  Administered 2012-06-24 – 2012-06-25 (×3): 500 mg via ORAL
  Filled 2012-06-23 (×4): qty 1

## 2012-06-23 MED ORDER — HYDROMORPHONE HCL PF 1 MG/ML IJ SOLN
0.5000 mg | INTRAMUSCULAR | Status: DC | PRN
  Administered 2012-06-23: 0.5 mg via INTRAVENOUS
  Administered 2012-06-24 (×4): 1 mg via INTRAVENOUS
  Filled 2012-06-23 (×4): qty 1

## 2012-06-23 MED ORDER — ATORVASTATIN CALCIUM 10 MG PO TABS
10.0000 mg | ORAL_TABLET | Freq: Every day | ORAL | Status: DC
  Filled 2012-06-23 (×5): qty 1

## 2012-06-23 MED ORDER — DOCUSATE SODIUM 100 MG PO CAPS
100.0000 mg | ORAL_CAPSULE | Freq: Two times a day (BID) | ORAL | Status: DC
  Administered 2012-06-23 – 2012-06-25 (×4): 100 mg via ORAL
  Filled 2012-06-23 (×4): qty 1

## 2012-06-23 MED ORDER — BISACODYL 5 MG PO TBEC: 5.0000 mg | DELAYED_RELEASE_TABLET | Freq: Every day | ORAL | Status: DC | PRN

## 2012-06-23 MED ORDER — DIPHENHYDRAMINE HCL 12.5 MG/5ML PO ELIX: 12.5000 mg | ORAL_SOLUTION | ORAL | Status: DC | PRN

## 2012-06-23 MED ORDER — HYDROMORPHONE HCL PF 1 MG/ML IJ SOLN
INTRAMUSCULAR | Status: AC
  Filled 2012-06-23: qty 1

## 2012-06-23 MED ORDER — FENTANYL CITRATE 0.05 MG/ML IJ SOLN: 50.0000 ug | INTRAMUSCULAR | Status: DC | PRN

## 2012-06-23 MED ORDER — MIDAZOLAM HCL 2 MG/2ML IJ SOLN
INTRAMUSCULAR | Status: AC
  Filled 2012-06-23: qty 2

## 2012-06-23 MED ORDER — MIDAZOLAM HCL 5 MG/5ML IJ SOLN
INTRAMUSCULAR | Status: DC | PRN
  Administered 2012-06-23: 1 mg via INTRAVENOUS

## 2012-06-23 MED ORDER — MENTHOL 3 MG MT LOZG: 1.0000 | LOZENGE | OROMUCOSAL | Status: DC | PRN

## 2012-06-23 MED ORDER — HYDROMORPHONE HCL PF 1 MG/ML IJ SOLN
0.2500 mg | INTRAMUSCULAR | Status: DC | PRN
  Administered 2012-06-23 (×2): 0.5 mg via INTRAVENOUS
  Administered 2012-06-23: 0.25 mg via INTRAVENOUS

## 2012-06-23 MED ORDER — LACTATED RINGERS IV SOLN
INTRAVENOUS | Status: DC
  Administered 2012-06-23 (×2): via INTRAVENOUS

## 2012-06-23 MED ORDER — METHOCARBAMOL 100 MG/ML IJ SOLN
500.0000 mg | Freq: Four times a day (QID) | INTRAVENOUS | Status: DC | PRN
  Filled 2012-06-23: qty 5

## 2012-06-23 MED ORDER — BUPIVACAINE-EPINEPHRINE PF 0.5-1:200000 % IJ SOLN
INTRAMUSCULAR | Status: DC | PRN
  Administered 2012-06-23: 30 mL

## 2012-06-23 MED ORDER — ASPIRIN EC 325 MG PO TBEC
325.0000 mg | DELAYED_RELEASE_TABLET | Freq: Two times a day (BID) | ORAL | Status: DC
  Administered 2012-06-24 – 2012-06-25 (×3): 325 mg via ORAL
  Filled 2012-06-23 (×5): qty 1

## 2012-06-23 MED ORDER — METOCLOPRAMIDE HCL 10 MG PO TABS: 5.0000 mg | ORAL_TABLET | Freq: Three times a day (TID) | ORAL | Status: DC | PRN

## 2012-06-23 MED ORDER — PHENOL 1.4 % MT LIQD: 1.0000 | OROMUCOSAL | Status: DC | PRN

## 2012-06-23 MED ORDER — LACTATED RINGERS IV SOLN
INTRAVENOUS | Status: DC | PRN
  Administered 2012-06-23 (×2): via INTRAVENOUS

## 2012-06-23 MED ORDER — ONDANSETRON HCL 4 MG/2ML IJ SOLN
INTRAMUSCULAR | Status: DC | PRN
  Administered 2012-06-23: 4 mg via INTRAVENOUS

## 2012-06-23 MED ORDER — CHLORHEXIDINE GLUCONATE 4 % EX LIQD: 60.0000 mL | Freq: Once | CUTANEOUS | Status: DC

## 2012-06-23 MED ORDER — HYDROCODONE-ACETAMINOPHEN 5-325 MG PO TABS
1.0000 | ORAL_TABLET | ORAL | Status: DC | PRN
  Administered 2012-06-23 – 2012-06-25 (×5): 2 via ORAL
  Filled 2012-06-23 (×6): qty 2

## 2012-06-23 MED ORDER — CEFAZOLIN SODIUM-DEXTROSE 2-3 GM-% IV SOLR: INTRAVENOUS | Status: DC | PRN

## 2012-06-23 MED ORDER — SODIUM CHLORIDE 0.9 % IR SOLN
Status: DC | PRN
  Administered 2012-06-23: 1000 mL
  Administered 2012-06-23: 3000 mL

## 2012-06-23 MED ORDER — ONDANSETRON HCL 4 MG PO TABS: 4.0000 mg | ORAL_TABLET | Freq: Four times a day (QID) | ORAL | Status: DC | PRN

## 2012-06-23 MED ORDER — ONDANSETRON HCL 4 MG/2ML IJ SOLN
4.0000 mg | Freq: Four times a day (QID) | INTRAMUSCULAR | Status: DC | PRN
  Administered 2012-06-24: 4 mg via INTRAVENOUS
  Filled 2012-06-23: qty 2

## 2012-06-23 MED ORDER — METOCLOPRAMIDE HCL 5 MG/ML IJ SOLN: 5.0000 mg | Freq: Three times a day (TID) | INTRAMUSCULAR | Status: DC | PRN

## 2012-06-23 MED ORDER — ACETAMINOPHEN 650 MG RE SUPP: 650.0000 mg | Freq: Four times a day (QID) | RECTAL | Status: DC | PRN

## 2012-06-23 MED ORDER — FENTANYL CITRATE 0.05 MG/ML IJ SOLN
INTRAMUSCULAR | Status: AC
  Administered 2012-06-23: 50 ug
  Filled 2012-06-23: qty 2

## 2012-06-23 MED ORDER — ACETAMINOPHEN 325 MG PO TABS: 650.0000 mg | ORAL_TABLET | Freq: Four times a day (QID) | ORAL | Status: DC | PRN

## 2012-06-23 MED ORDER — PROPOFOL INFUSION 10 MG/ML OPTIME
INTRAVENOUS | Status: DC | PRN
  Administered 2012-06-23: 50 ug/kg/min via INTRAVENOUS

## 2012-06-23 MED ORDER — PHENYLEPHRINE HCL 10 MG/ML IJ SOLN
INTRAMUSCULAR | Status: DC | PRN
  Administered 2012-06-23 (×2): 40 ug via INTRAVENOUS

## 2012-06-23 MED ORDER — BUPIVACAINE IN DEXTROSE 0.75-8.25 % IT SOLN
INTRATHECAL | Status: DC | PRN
  Administered 2012-06-23: 12 mg via INTRATHECAL

## 2012-06-23 MED ORDER — CEFAZOLIN SODIUM-DEXTROSE 2-3 GM-% IV SOLR
2.0000 g | Freq: Four times a day (QID) | INTRAVENOUS | Status: AC
  Administered 2012-06-23 (×2): 2 g via INTRAVENOUS
  Filled 2012-06-23 (×3): qty 50

## 2012-06-23 MED ORDER — LACTATED RINGERS IV SOLN
INTRAVENOUS | Status: DC
  Administered 2012-06-23: 11:00:00 via INTRAVENOUS

## 2012-06-23 SURGICAL SUPPLY — 74 items
APL SKNCLS STERI-STRIP NONHPOA (GAUZE/BANDAGES/DRESSINGS) ×1
BANDAGE ELASTIC 4 VELCRO ST LF (GAUZE/BANDAGES/DRESSINGS) ×2 IMPLANT
BANDAGE ESMARK 6X9 LF (GAUZE/BANDAGES/DRESSINGS) ×1 IMPLANT
BANDAGE GAUZE ELAST BULKY 4 IN (GAUZE/BANDAGES/DRESSINGS) ×3 IMPLANT
BENZOIN TINCTURE PRP APPL 2/3 (GAUZE/BANDAGES/DRESSINGS) ×1 IMPLANT
BLADE SAGITTAL 25.0X1.19X90 (BLADE) ×2 IMPLANT
BLADE SURG ROTATE 9660 (MISCELLANEOUS) IMPLANT
BNDG CMPR 9X6 STRL LF SNTH (GAUZE/BANDAGES/DRESSINGS) ×1
BNDG CMPR MED 10X6 ELC LF (GAUZE/BANDAGES/DRESSINGS) ×1
BNDG ELASTIC 6X10 VLCR STRL LF (GAUZE/BANDAGES/DRESSINGS) ×2 IMPLANT
BNDG ESMARK 6X9 LF (GAUZE/BANDAGES/DRESSINGS) ×2
BOWL SMART MIX CTS (DISPOSABLE) ×2 IMPLANT
CAPT RP KNEE ×1 IMPLANT
CEMENT HV SMART SET (Cement) ×4 IMPLANT
CLOTH BEACON ORANGE TIMEOUT ST (SAFETY) ×2 IMPLANT
COVER SURGICAL LIGHT HANDLE (MISCELLANEOUS) ×2 IMPLANT
CUFF TOURNIQUET SINGLE 34IN LL (TOURNIQUET CUFF) ×2 IMPLANT
CUFF TOURNIQUET SINGLE 44IN (TOURNIQUET CUFF) IMPLANT
DRAPE EXTREMITY T 121X128X90 (DRAPE) ×2 IMPLANT
DRAPE PROXIMA HALF (DRAPES) ×2 IMPLANT
DRAPE U-SHAPE 47X51 STRL (DRAPES) ×2 IMPLANT
DRSG ADAPTIC 3X8 NADH LF (GAUZE/BANDAGES/DRESSINGS) ×2 IMPLANT
DRSG PAD ABDOMINAL 8X10 ST (GAUZE/BANDAGES/DRESSINGS) ×2 IMPLANT
DURAPREP 26ML APPLICATOR (WOUND CARE) ×2 IMPLANT
ELECT REM PT RETURN 9FT ADLT (ELECTROSURGICAL) ×2
ELECTRODE REM PT RTRN 9FT ADLT (ELECTROSURGICAL) ×1 IMPLANT
FACESHIELD LNG OPTICON STERILE (SAFETY) ×3 IMPLANT
GLOVE BIO SURGEON STRL SZ7 (GLOVE) ×1 IMPLANT
GLOVE BIO SURGEON STRL SZ8.5 (GLOVE) ×3 IMPLANT
GLOVE BIOGEL PI IND STRL 7.0 (GLOVE) IMPLANT
GLOVE BIOGEL PI IND STRL 7.5 (GLOVE) IMPLANT
GLOVE BIOGEL PI IND STRL 8 (GLOVE) ×1 IMPLANT
GLOVE BIOGEL PI IND STRL 8.5 (GLOVE) ×1 IMPLANT
GLOVE BIOGEL PI INDICATOR 7.0 (GLOVE) ×1
GLOVE BIOGEL PI INDICATOR 7.5 (GLOVE) ×1
GLOVE BIOGEL PI INDICATOR 8 (GLOVE) ×1
GLOVE BIOGEL PI INDICATOR 8.5 (GLOVE) ×1
GLOVE ECLIPSE 6.5 STRL STRAW (GLOVE) ×2 IMPLANT
GLOVE ECLIPSE 7.0 STRL STRAW (GLOVE) ×1 IMPLANT
GLOVE SS BIOGEL STRL SZ 8 (GLOVE) ×1 IMPLANT
GLOVE SUPERSENSE BIOGEL SZ 8 (GLOVE) ×3
GOWN PREVENTION PLUS XLARGE (GOWN DISPOSABLE) ×2 IMPLANT
GOWN PREVENTION PLUS XXLARGE (GOWN DISPOSABLE) ×2 IMPLANT
GOWN SRG XL XLNG 56XLVL 4 (GOWN DISPOSABLE) IMPLANT
GOWN STRL NON-REIN LRG LVL3 (GOWN DISPOSABLE) ×2 IMPLANT
GOWN STRL NON-REIN XL XLG LVL4 (GOWN DISPOSABLE) ×2
HANDPIECE INTERPULSE COAX TIP (DISPOSABLE) ×2
HOOD PEEL AWAY FACE SHEILD DIS (HOOD) ×3 IMPLANT
IMMOBILIZER KNEE 20 (SOFTGOODS) ×2
IMMOBILIZER KNEE 20 THIGH 36 (SOFTGOODS) IMPLANT
IMMOBILIZER KNEE 22 UNIV (SOFTGOODS) ×1 IMPLANT
IMMOBILIZER KNEE 24 THIGH 36 (MISCELLANEOUS) IMPLANT
IMMOBILIZER KNEE 24 UNIV (MISCELLANEOUS)
KIT BASIN OR (CUSTOM PROCEDURE TRAY) ×2 IMPLANT
KIT ROOM TURNOVER OR (KITS) ×2 IMPLANT
MANIFOLD NEPTUNE II (INSTRUMENTS) ×2 IMPLANT
NS IRRIG 1000ML POUR BTL (IV SOLUTION) ×2 IMPLANT
PACK TOTAL JOINT (CUSTOM PROCEDURE TRAY) ×2 IMPLANT
PAD ARMBOARD 7.5X6 YLW CONV (MISCELLANEOUS) ×4 IMPLANT
SET HNDPC FAN SPRY TIP SCT (DISPOSABLE) ×1 IMPLANT
SPONGE GAUZE 4X4 12PLY (GAUZE/BANDAGES/DRESSINGS) ×2 IMPLANT
STAPLER VISISTAT 35W (STAPLE) IMPLANT
STRIP CLOSURE SKIN 1/2X4 (GAUZE/BANDAGES/DRESSINGS) ×2 IMPLANT
SUCTION FRAZIER TIP 10 FR DISP (SUCTIONS) ×1 IMPLANT
SUT MNCRL AB 3-0 PS2 18 (SUTURE) ×1 IMPLANT
SUT VIC AB 0 CT1 27 (SUTURE) ×4
SUT VIC AB 0 CT1 27XBRD ANBCTR (SUTURE) ×2 IMPLANT
SUT VIC AB 2-0 CT1 27 (SUTURE) ×2
SUT VIC AB 2-0 CT1 TAPERPNT 27 (SUTURE) ×2 IMPLANT
SUT VLOC 180 0 24IN GS25 (SUTURE) ×2 IMPLANT
TOWEL OR 17X24 6PK STRL BLUE (TOWEL DISPOSABLE) ×2 IMPLANT
TOWEL OR 17X26 10 PK STRL BLUE (TOWEL DISPOSABLE) ×2 IMPLANT
TRAY FOLEY CATH 14FR (SET/KITS/TRAYS/PACK) ×2 IMPLANT
WATER STERILE IRR 1000ML POUR (IV SOLUTION) ×3 IMPLANT

## 2012-06-23 NOTE — Progress Notes (Signed)
Orthopedic Tech Progress Note Patient Details:  Katherine Novak 1934-04-28 [id number]  CPM Left Knee CPM Left Knee: On Left Knee Flexion (Degrees): 60 Left Knee Extension (Degrees): 0 Additional Comments: trapeze bar patient helper Ortho dept is currently out of footsie roll at this time; RN notified  Nikki Dom 06/23/2012, 2:50 PM

## 2012-06-23 NOTE — Anesthesia Procedure Notes (Addendum)
Anesthesia Regional Block:  Femoral nerve block  Pre-Anesthetic Checklist: ,, timeout performed, Correct Patient, Correct Site, Correct Laterality, Correct Procedure, Correct Position, site marked, Risks and benefits discussed, pre-op evaluation,  At surgeon's request and post-op pain management  Laterality: Left  Prep: Maximum Sterile Barrier Precautions used and chloraprep       Needles:  Injection technique: Single-shot  Needle Type: Echogenic Stimulator Needle      Needle Gauge: 22 and 22 G    Additional Needles:  Procedures: ultrasound guided (picture in chart) and nerve stimulator Femoral nerve block  Nerve Stimulator or Paresthesia:  Response: Patellar respose, 0.4 mA,   Additional Responses:   Narrative:  Start time: 06/23/2012 11:15 AM End time: 06/23/2012 11:23 AM Injection made incrementally with aspirations every 5 mL. Anesthesiologist: Simona Rocque,MD  Additional Notes: 2% Lidocaine skin wheel.   Femoral nerve block Procedure Name: MAC Date/Time: 06/23/2012 12:02 PM Performed by: Ellin Goodie Pre-anesthesia Checklist: Patient identified, Emergency Drugs available, Suction available, Patient being monitored and Timeout performed Patient Re-evaluated:Patient Re-evaluated prior to inductionOxygen Delivery Method: Simple face mask Preoxygenation: Pre-oxygenation with 100% oxygen Intubation Type: IV induction Placement Confirmation: breath sounds checked- equal and bilateral Dental Injury: Teeth and Oropharynx as per pre-operative assessment     Spinal  Patient location during procedure: OR Start time: 06/23/2012 11:38 AM End time: 06/23/2012 11:44 AM Staffing Anesthesiologist: Gaynelle Adu E Performed by: anesthesiologist  Preanesthetic Checklist Completed: patient identified, surgical consent, pre-op evaluation, timeout performed, IV checked, risks and benefits discussed and monitors and equipment checked Spinal Block Patient position:  sitting Prep: Betadine Patient monitoring: cardiac monitor, continuous pulse ox and blood pressure Approach: midline Location: L3-4 Injection technique: single-shot Needle Needle type: Quincke  Needle gauge: 25 G Needle length: 9 cm Assessment Sensory level: T10 Additional Notes Functioning IV was confirmed and monitors were applied. Sterile prep and drape, including hand hygiene and sterile gloves were used. The patient was positioned and the spine was prepped. The skin was anesthetized with lidocaine.  Free flow of clear CSF was obtained prior to injecting local anesthetic into the CSF.  The spinal needle aspirated freely following injection.  The needle was carefully withdrawn.  The patient tolerated the procedure well.

## 2012-06-23 NOTE — Transfer of Care (Signed)
Immediate Anesthesia Transfer of Care Note  Patient: Katherine Novak  Procedure(s) Performed: Procedure(s) with comments: TOTAL KNEE ARTHROPLASTY (Left) - DEPUY, RNFA  Patient Location: PACU  Anesthesia Type:MAC, Spinal and MAC combined with regional for post-op pain  Level of Consciousness: awake, alert  and oriented  Airway & Oxygen Therapy: Patient Spontanous Breathing and Patient connected to nasal cannula oxygen  Post-op Assessment: Report given to PACU RN and Post -op Vital signs reviewed and stable  Post vital signs: Reviewed and stable  Complications: No apparent anesthesia complications

## 2012-06-23 NOTE — Preoperative (Signed)
Beta Blockers   Reason not to administer Beta Blockers:Not Applicable 

## 2012-06-23 NOTE — Progress Notes (Signed)
UR COMPLETED  

## 2012-06-23 NOTE — Anesthesia Preprocedure Evaluation (Signed)
Anesthesia Evaluation  Patient identified by MRN, date of birth, ID band Patient awake    Reviewed: Allergy & Precautions, H&P , NPO status , Patient's Chart, lab work & pertinent test results  Airway Mallampati: II TM Distance: >3 FB Neck ROM: Full    Dental no notable dental hx. (+) Teeth Intact and Dental Advisory Given   Pulmonary neg pulmonary ROS,  breath sounds clear to auscultation  Pulmonary exam normal       Cardiovascular hypertension, On Medications + Peripheral Vascular Disease Rhythm:Regular Rate:Normal     Neuro/Psych negative neurological ROS  negative psych ROS   GI/Hepatic Neg liver ROS, GERD-  Medicated and Controlled,  Endo/Other  negative endocrine ROS  Renal/GU negative Renal ROS  negative genitourinary   Musculoskeletal   Abdominal   Peds  Hematology negative hematology ROS (+)   Anesthesia Other Findings   Reproductive/Obstetrics negative OB ROS                           Anesthesia Physical Anesthesia Plan  ASA: II  Anesthesia Plan: MAC, Spinal and Regional   Post-op Pain Management: MAC Combined w/ Regional for Post-op pain   Induction: Intravenous  Airway Management Planned:   Additional Equipment:   Intra-op Plan:   Post-operative Plan:   Informed Consent: I have reviewed the patients History and Physical, chart, labs and discussed the procedure including the risks, benefits and alternatives for the proposed anesthesia with the patient or authorized representative who has indicated his/her understanding and acceptance.   Dental advisory given  Plan Discussed with: CRNA and Surgeon  Anesthesia Plan Comments:         Anesthesia Quick Evaluation

## 2012-06-23 NOTE — Plan of Care (Signed)
Problem: Consults Goal: Diagnosis- Total Joint Replacement Primary Total Knee Left     

## 2012-06-23 NOTE — Interval H&P Note (Signed)
History and Physical Interval Note:  06/23/2012 10:28 AM  Katherine Novak  has presented today for surgery, with the diagnosis of LEFT KNEE DEGENERATIVE JOINT DISEASE  The various methods of treatment have been discussed with the patient and family. After consideration of risks, benefits and other options for treatment, the patient has consented to  Procedure(s) with comments: TOTAL KNEE ARTHROPLASTY (Left) - DEPUY, RNFA as a surgical intervention .  The patient's history has been reviewed, patient examined, no change in status, stable for surgery.  I have reviewed the patient's chart and labs.  Questions were answered to the patient's satisfaction.     Ebrahim Deremer G

## 2012-06-23 NOTE — Op Note (Signed)
PREOP DIAGNOSIS: DJD LEFT KNEE POSTOP DIAGNOSIS: DJD LEFT KNEE PROCEDURE: LEFT TKR ANESTHESIA: Spinal and block ATTENDING SURGEON: Shamanda Len Novak ASSISTANT: Lindwood Qua PA and Patrick Jupiter RNFA  INDICATIONS FOR PROCEDURE: Katherine Novak is a 78 y.o. female who has struggled for a long time with pain due to degenerative arthritis of the left knee.  The patient has failed many conservative non-operative measures and at this point has pain which limits the ability to sleep and walk.  The patient is offered total knee replacement.  Informed operative consent was obtained after discussion of possible risks of anesthesia, infection, neurovascular injury, DVT, and death.  The importance of the post-operative rehabilitation protocol to optimize result was stressed extensively with the patient.  SUMMARY OF FINDINGS AND PROCEDURE:  Katherine Novak was taken to the operative suite where under the above anesthesia a left knee replacement was performed.  There were advanced degenerative changes and the bone quality was fair.  We used the DePuy system and placed size standard femur, 2.5 tibia, 32 mm all polyethylene patella, and a size 10 mm spacer.  The patient was admitted for appropriate post-op care to include perioperative antibiotics and mechanical and pharmacologic measures for DVT prophylaxis.  DESCRIPTION OF PROCEDURE:  Katherine Novak was taken to the operative suite where the above anesthesia was applied.  The patient was positioned supine and prepped and draped in normal sterile fashion.  An appropriate time out was performed.  After the administration of kefzol pre-op antibiotic the leg was elevated and exsanguinated and a tourniquet inflated.  A standard longitudinal incision was made on the anterior knee.  Dissection was carried down to the extensor mechanism.  All appropriate anti-infective measures were used including the pre-operative antibiotic, betadine impregnated drape, and closed hooded  exhaust systems for each member of the surgical team.  A medial parapatellar incision was made in the extensor mechanism and the knee cap flipped and the knee flexed.  Some residual meniscal tissues were removed along with any remaining ACL/PCL tissue.  A guide was placed on the tibia and a flat cut was made on it's superior surface.  An intramedullary guide was placed in the femur and was utilized to make anterior and posterior cuts creating an appropriate flexion gap.  A second intramedullary guide was placed in the femur to make a distal cut properly balancing the knee with an extension gap equal to the flexion gap.  The three bones sized to the above mentioned sizes and the appropriate guides were placed and utilized.  A trial reduction was done and the knee easily came to full extension and the patella tracked well on flexion.  The trial components were removed and all bones were cleaned with pulsatile lavage and then dried thoroughly.  Cement was mixed and was pressurized onto the bones followed by placement of the aforementioned components.  Excess cement was trimmed and pressure was held on the components until the cement had hardened.  The tourniquet was deflated and a small amount of bleeding was controlled with cautery and pressure.  The knee was irrigated thoroughly.  The extensor mechanism was re-approximated with V-loc suture in running fashion.  The knee was flexed and the repair was solid.  The subcutaneous tissues were re-approximated with #0 and #2-0 vicryl and the skin closed with a subcuticular stitch and steristrips.  A sterile dressing was applied.  Intraoperative fluids, EBL, and tourniquet time can be obtained from anesthesia records.  DISPOSITION:  The patient was taken  to recovery room in stable condition and admitted for appropriate post-op care to include peri-operative antibiotic and DVT prophylaxis with mechanical and pharmacologic measures.  Katherine Novak 06/23/2012, 1:13 PM

## 2012-06-23 NOTE — Anesthesia Postprocedure Evaluation (Signed)
  Anesthesia Post-op Note  Patient: [name]  Procedure(s) Performed: Procedure(s) with comments: TOTAL KNEE ARTHROPLASTY (Left) - DEPUY, RNFA  Patient Location: PACU  Anesthesia Type:MAC and Spinal  Level of Consciousness: awake, alert  and oriented  Airway and Oxygen Therapy: Patient Spontanous Breathing and Patient connected to nasal cannula oxygen  Post-op Pain: mild  Post-op Assessment: Post-op Vital signs reviewed, Patient's Cardiovascular Status Stable, Respiratory Function Stable, Patent Airway, No signs of Nausea or vomiting and No residual motor weakness  Post-op Vital Signs: Reviewed and stable  Complications: No apparent anesthesia complications

## 2012-06-24 ENCOUNTER — Encounter (HOSPITAL_COMMUNITY): Payer: Self-pay | Admitting: General Practice

## 2012-06-24 LAB — BASIC METABOLIC PANEL
Chloride: 103 mEq/L (ref 96–112)
GFR calc Af Amer: >90 mL/min (ref >90)
GFR calc non Af Amer: 90 mL/min — ABNORMAL LOW (ref >90)
Glucose, Bld: 169 mg/dL — ABNORMAL HIGH (ref 70–99)
Potassium: 4.2 mEq/L (ref 3.5–5.1)
Sodium: 136 mEq/L (ref 135–145)

## 2012-06-24 LAB — CBC
Hemoglobin: 9 g/dL — ABNORMAL LOW (ref 12.0–15.0)
MCHC: 31.3 g/dL (ref 30.0–36.0)
RDW: 16.6 % — ABNORMAL HIGH (ref 11.5–15.5)
WBC: 10.9 10*3/uL — ABNORMAL HIGH (ref 4.0–10.5)

## 2012-06-24 NOTE — Progress Notes (Signed)
Physical Therapy Treatment Patient Details Name: Katherine Novak MRN: [id number] DOB: 1934/04/12 Today's Date: 06/24/2012 Time: 5284-1324 PT Time Calculation (min): 34 min  PT Assessment / Plan / Recommendation Comments on Treatment Session  Pt limited in therapy today due to nausea and vomitting with activity; pt able to progress amb but became nausous after toilet transfer. Will cont to f/u with pt to maximize functional mobility in order to return home with faimly.     Follow Up Recommendations  Home health PT;Supervision for mobility/OOB;Supervision - Intermittent     Does the patient have the potential to tolerate intense rehabilitation     Barriers to Discharge None      Equipment Recommendations  Rolling walker with 5" wheels;Other (comment)    Recommendations for Other Services    Frequency 7X/week   Plan Discharge plan remains appropriate;Frequency remains appropriate    Precautions / Restrictions Precautions Precautions: Knee;Fall Precaution Booklet Issued: Yes (comment) Precaution Comments: pt given handout of TKA protocol Required Braces or Orthoses: Knee Immobilizer - Left Knee Immobilizer - Left: Other (comment) Restrictions Weight Bearing Restrictions: Yes LLE Weight Bearing: Weight bearing as tolerated   Pertinent Vitals/Pain C/o nausea and is vomiting. Pt states pain is "not too bad" per family did not rate     Mobility  Bed Mobility Bed Mobility: Supine to Sit;Sit to Supine Supine to Sit: 4: Min guard;HOB elevated;With rails Sitting - Scoot to Edge of Bed: 4: Min assist Sit to Supine: 4: Min assist;HOB flat Details for Bed Mobility Assistance: (A) to control descent of L LE to floor and advance L LE onto bed into supine position; pt requires verbal cues and tactile cues for sequencing  Transfers Transfers: Sit to Stand;Stand to Sit Sit to Stand: 4: Min guard;From bed;From elevated surface;With upper extremity assist Stand to Sit: 4: Min guard;To  bed;With upper extremity assist;To chair/3-in-1 Details for Transfer Assistance: pt transferred bed <> toilet <> bed;; pt can be impulsive at times; requires verbal cues translated by family; pt tends to pull to stand from RW and transfers without using RW ; decreased safety awareness  Ambulation/Gait Ambulation/Gait Assistance: 4: Min guard Ambulation Distance (Feet): 60 Feet Assistive device: Rolling walker Ambulation/Gait Assistance Details: pt required multiple standing rest breaks c/o dizziniess with amb; pt became nauseous and began to vomit while standing;  pt cont to demo step to gt pattern with short step length on R LE; multimodal cues for upirght posture  Gait Pattern: Step-to pattern;Step-through pattern;Decreased stance time - left;Decreased step length - right;Trunk flexed Gait velocity: decreased due to pain  Stairs: No Wheelchair Mobility Wheelchair Mobility: No    Exercises Total Joint Exercises Ankle Circles/Pumps: AAROM;AROM;Both;10 reps;Supine Quad Sets: AAROM;AROM;Both;10 reps;Seated Long Arc Quad: AAROM;Left;10 reps;Seated Knee Flexion: AAROM;Left;10 reps;Seated Goniometric ROM: -4 to 70 degrees AROM in sitting; limited by pain    PT Diagnosis: Difficulty walking;Acute pain  PT Problem List: Decreased strength;Decreased range of motion;Decreased balance;Decreased mobility;Decreased knowledge of use of DME;Decreased safety awareness;Pain PT Treatment Interventions: DME instruction;Gait training;Stair training;Functional mobility training;Therapeutic activities;Therapeutic exercise;Balance training;Neuromuscular re-education;Patient/family education   PT Goals Acute Rehab PT Goals PT Goal Formulation: With patient Time For Goal Achievement: 07/01/12 Potential to Achieve Goals: Good Pt will go Supine/Side to Sit: with modified independence PT Goal: Supine/Side to Sit - Progress: Progressing toward goal Pt will go Sit to Supine/Side: with modified independence PT  Goal: Sit to Supine/Side - Progress: Progressing toward goal Pt will go Sit to Stand: with modified independence PT Goal: Sit to  Stand - Progress: Progressing toward goal Pt will go Stand to Sit: with modified independence PT Goal: Stand to Sit - Progress: Progressing toward goal Pt will Ambulate: >150 feet;with modified independence;with rolling walker PT Goal: Ambulate - Progress: Progressing toward goal Pt will Go Up / Down Stairs: 3-5 stairs;with rail(s);with rolling walker;with supervision PT Goal: Up/Down Stairs - Progress: Goal set today Pt will Perform Home Exercise Program: Independently PT Goal: Perform Home Exercise Program - Progress: Goal set today  Visit Information  Last PT Received On: 06/24/12 Assistance Needed: +1 PT/OT Co-Evaluation/Treatment: Yes    Subjective Data  Subjective: pt lying supine with family present to translate; eager to walk  Patient Stated Goal: home with family per Son   Cognition  Cognition Arousal/Alertness: Awake/alert Behavior During Therapy: Impulsive Overall Cognitive Status: Within Functional Limits for tasks assessed    Balance  Balance Balance Assessed: Yes Dynamic Sitting Balance Dynamic Sitting - Balance Support: No upper extremity supported;Feet unsupported;During functional activity Dynamic Sitting - Level of Assistance: 5: Stand by assistance Dynamic Sitting - Comments: while donning/doffing socks Static Standing Balance Static Standing - Balance Support: Bilateral upper extremity supported;During functional activity Static Standing - Level of Assistance: 5: Stand by assistance Static Standing - Comment/# of Minutes: tolerated standing ~3 min at sink while performing ADLs  End of Session PT - End of Session Equipment Utilized During Treatment: Gait belt;Left knee immobilizer Activity Tolerance: Treatment limited secondary to medication;Other (comment) (pt began vomitting ) Patient left: in bed;with call bell/phone within  reach;in CPM;with family/visitor present (CPM set to 50 degrees and tolerable ) Nurse Communication: Mobility status (medication for nausea ) CPM Left Knee CPM Left Knee: [address]   GP     Donnamarie Poag [place], [place] [phone] 06/24/2012, 1:42 PM

## 2012-06-24 NOTE — Care Management Note (Signed)
CARE MANAGEMENT NOTE 06/24/2012  Patient:  Katherine Novak, Katherine Novak   Account Number:  [id number]  Date Initiated:  06/24/2012  Documentation initiated by:  Vance Peper  Subjective/Objective Assessment:   78 yr old female s/p left total knee arthroplasty.     Action/Plan:   CM spoke with patient's son concerning home health and DME needs at discharge. patient preoperatively setup with [facility], no changes. rolling walker, 3in1 and CPM to be delivered.   Anticipated DC Date:  06/25/2012   Anticipated DC Plan:  HOME W HOME HEALTH SERVICES      DC Planning Services  CM consult      [facility] Choice  HOME HEALTH  DURABLE MEDICAL EQUIPMENT   Choice offered to / List presented to:  C-4 Adult Children   DME arranged  [facility]  3-N-1  CPM      DME agency  [employer]     HH arranged  HH-2 PT      HH agency  [facility]   Status of service:  Completed, signed off Medicare Important Message given?   (If response is "NO", the following Medicare IM given date fields will be blank) Date Medicare IM given:   Date Additional Medicare IM given:    Discharge Disposition:  HOME W HOME HEALTH SERVICES  Per UR Regulation:    If discussed at Long Length of Stay Meetings, dates discussed:    Comments:

## 2012-06-24 NOTE — Progress Notes (Signed)
SW received a consult for possible placement. PT  At this time is recommending home with HH and not SNF. CSW will make CM aware. Clinical Social Worker will sign off for now as social work intervention is no longer needed. Please consult us again if new need arises.   Amro Winebarger, MSW [phone] 

## 2012-06-24 NOTE — Progress Notes (Addendum)
Subjective: 1 Day Post-Op Procedure(s) (LRB): TOTAL KNEE ARTHROPLASTY (Left)  Activity level:  wbat Diet tolerance:  ok Voiding:  ok Patient reports pain as 4 on 0-10 scale.    Objective: Vital signs in last 24 hours: Temp:  [96.6 F (35.9 C)-97.8 F (36.6 C)] 97.7 F (36.5 C) (06/18 0454) Pulse Rate:  [68-97] 89 (06/18 0454) Resp:  [10-19] 14 (06/18 0454) BP: (122-163)/(66-84) 122/68 mmHg (06/18 0454) SpO2:  [95 %-100 %] 100 % (06/18 0454)  Labs:  Recent Labs  06/24/12 0413  HGB 9.0*    Recent Labs  06/24/12 0413  WBC 10.9*  RBC 3.39*  HCT 28.8*  PLT 144*    Recent Labs  06/24/12 0413  NA 136  K 4.2  CL 103  CO2 26  BUN 11  CREATININE 0.53  GLUCOSE 169*  CALCIUM 8.6   No results found for this basename: LABPT, INR,  in the last 72 hours  Physical Exam:  Neurologically intact ABD soft Neurovascular intact Sensation intact distally Intact pulses distally Dorsiflexion/Plantar flexion intact Incision: dressing C/D/I No cellulitis present  Assessment/Plan:  1 Day Post-Op Procedure(s) (LRB): TOTAL KNEE ARTHROPLASTY (Left) Advance diet Up with therapy D/C IV fluids Plan for discharge tomorrow if passes therapy ASA [id number] bid x 2 weeks  scd's Will keep eye on hgb Dressing change today    Katherine Novak R 06/24/2012, 8:01 AM

## 2012-06-24 NOTE — Evaluation (Signed)
Physical Therapy Evaluation Patient Details Name: Katherine Novak MRN: [id number] DOB: 04-04-1934 Today's Date: 06/24/2012 Time: 4098-1191 PT Time Calculation (min): 30 min  PT Assessment / Plan / Recommendation Clinical Impression  Pt is a 78 y.o. female s/p L TKA POD#1. Pt presents with decreased mobility and decreased indpendence in gt; due to increased pain, decreased ROM/strength in L knee. Will benefit from skilled PT to maximize functional mobility and ensure safe transition home with family and HHPT.    PT Assessment  Patient needs continued PT services    Follow Up Recommendations  Home health PT;Supervision for mobility/OOB;Supervision - Intermittent    Does the patient have the potential to tolerate intense rehabilitation      Barriers to Discharge None      Equipment Recommendations  Rolling walker with 5" wheels;Other (comment) (3 in 1 commode )    Recommendations for Other Services     Frequency 7X/week    Precautions / Restrictions Precautions Precautions: Knee;Fall Precaution Booklet Issued: Yes (comment) Precaution Comments: pt given handout of TKA protocol Required Braces or Orthoses: Knee Immobilizer - Left Knee Immobilizer - Left: Other (comment) (MD did not specify) Restrictions Weight Bearing Restrictions: Yes LLE Weight Bearing: Weight bearing as tolerated   Pertinent Vitals/Pain 5/10; pt premedicated.       Mobility  Bed Mobility Bed Mobility: Supine to Sit;Sitting - Scoot to Edge of Bed Supine to Sit: 4: Min assist;HOB elevated;With rails Sitting - Scoot to Edge of Bed: 4: Min assist Details for Bed Mobility Assistance: (A) to advance L LE to/off EOB and control descent of L LE to floor; requires increased time due to pain  Transfers Transfers: Sit to Stand;Stand to Sit Sit to Stand: 4: Min assist;From bed;From elevated surface;With upper extremity assist Stand to Sit: 4: Min assist;To chair/3-in-1;With armrests;With upper extremity  assist Details for Transfer Assistance: performed transfer bed <>  3 in 1 <> recliner; requires verbal cues for hand placement and sequencing, Son able to translate, (A) to steady and balance in upright position due to pain in L LE with WB  Ambulation/Gait Ambulation/Gait Assistance: 4: Min guard Ambulation Distance (Feet): 10 Feet (x2) Assistive device: Rolling walker Ambulation/Gait Assistance Details: verbal cues for gt sequencing and RW safety; pt encouraged to increase step length with R LE; intiatlly amb with step to gt and progressed to step through with cues  Gait Pattern: Step-to pattern;Step-through pattern;Decreased stance time - left;Decreased step length - right;Trunk flexed Gait velocity: decreased due to pain  Stairs: No Wheelchair Mobility Wheelchair Mobility: No    Exercises Total Joint Exercises Ankle Circles/Pumps: AAROM;AROM;Both;10 reps;Supine Quad Sets: AAROM;AROM;Both;10 reps;Seated Long Arc Quad: AAROM;Left;10 reps;Seated Knee Flexion: AAROM;Left;10 reps;Seated Goniometric ROM: -4 to 70 degrees AROM in sitting; limited by pain    PT Diagnosis: Difficulty walking;Acute pain  PT Problem List: Decreased strength;Decreased range of motion;Decreased balance;Decreased mobility;Decreased knowledge of use of DME;Decreased safety awareness;Pain PT Treatment Interventions: DME instruction;Gait training;Stair training;Functional mobility training;Therapeutic activities;Therapeutic exercise;Balance training;Neuromuscular re-education;Patient/family education   PT Goals Acute Rehab PT Goals PT Goal Formulation: With patient Time For Goal Achievement: 07/01/12 Potential to Achieve Goals: Good Pt will go Supine/Side to Sit: with modified independence PT Goal: Supine/Side to Sit - Progress: Goal set today Pt will go Sit to Supine/Side: with modified independence PT Goal: Sit to Supine/Side - Progress: Goal set today Pt will go Sit to Stand: with modified independence PT  Goal: Sit to Stand - Progress: Goal set today Pt will go Stand to Sit:  with modified independence PT Goal: Stand to Sit - Progress: Goal set today Pt will Ambulate: >150 feet;with modified independence;with rolling walker PT Goal: Ambulate - Progress: Goal set today Pt will Go Up / Down Stairs: 3-5 stairs;with rail(s);with rolling walker;with supervision PT Goal: Up/Down Stairs - Progress: Goal set today Pt will Perform Home Exercise Program: Independently PT Goal: Perform Home Exercise Program - Progress: Goal set today  Visit Information  Last PT Received On: 06/24/12 Assistance Needed: +1 PT/OT Co-Evaluation/Treatment: Yes    Subjective Data  Subjective: Pt lying supine with son present; son translates for mother Patient Stated Goal: home with family per Son   Prior Functioning  Home Living Lives With: Family;Son Available Help at Discharge: Family;Available 24 hours/day Type of Home: House Home Access: Stairs to enter [employer] of Steps: 5 Entrance Stairs-Rails: Right;Left Home Layout: One level Bathroom Shower/Tub: Walk-in shower;Tub/shower unit Teacher, early years/pre: Yes How Accessible: Accessible via walker Home Adaptive Equipment: Quad cane Prior Function Level of Independence: Independent with assistive device(s) Driving: No Dominant Hand: Right    Cognition  Cognition Arousal/Alertness: Awake/alert Behavior During Therapy: WFL for tasks assessed/performed Overall Cognitive Status: Within Functional Limits for tasks assessed    Extremity/Trunk Assessment Right Upper Extremity Assessment RUE ROM/Strength/Tone: [facility] for tasks assessed Left Upper Extremity Assessment LUE ROM/Strength/Tone: WFL for tasks assessed Right Lower Extremity Assessment RLE ROM/Strength/Tone: WFL for tasks assessed RLE Sensation: WFL - Light Touch Left Lower Extremity Assessment LLE ROM/Strength/Tone: Deficits;Unable to fully assess;Due to  pain LLE ROM/Strength/Tone Deficits: DF/PF WFL; knee grossly 2+/5 LLE Sensation: WFL - Light Touch Trunk Assessment Trunk Assessment: Normal   Balance Balance Balance Assessed: Yes Dynamic Sitting Balance Dynamic Sitting - Balance Support: No upper extremity supported;Feet unsupported;During functional activity Dynamic Sitting - Level of Assistance: 5: Stand by assistance Dynamic Sitting - Comments: while donning/doffing socks  End of Session PT - End of Session Equipment Utilized During Treatment: Gait belt;Left knee immobilizer Activity Tolerance: Patient tolerated treatment well Patient left: in chair;with call bell/phone within reach;with family/visitor present Nurse Communication: Mobility status CPM Left Knee CPM Left Knee: [address]  GP     Donnamarie Poag [place], [place] [phone] 06/24/2012, 12:18 PM

## 2012-06-24 NOTE — Evaluation (Signed)
Occupational Therapy Evaluation Patient Details Name: Katherine Novak MRN: [id number] DOB: Aug 07, 1934 Today's Date: 06/24/2012 Time: 8469-6295 OT Time Calculation (min): 29 min  OT Assessment / Plan / Recommendation Clinical Impression    Pt is a 78 y.o. female s/p L TKA POD#1. Pt presents with below problem list. Will benefit from skilled OT to maximize independence and ensure safe transition home with family.      OT Assessment  Patient needs continued OT Services    Follow Up Recommendations  No OT follow up;Supervision/Assistance - 24 hour    Barriers to Discharge      Equipment Recommendations  3 in 1 bedside comode    Recommendations for Other Services    Frequency  Min 2X/week    Precautions / Restrictions Precautions Precautions: Knee;Fall Precaution Booklet Issued: Yes (comment) Precaution Comments: pt given handout of TKA protocol Required Braces or Orthoses: Knee Immobilizer - Left Knee Immobilizer - Left: Other (comment) (MD did not specify) Restrictions Weight Bearing Restrictions: Yes LLE Weight Bearing: Weight bearing as tolerated   Pertinent Vitals/Pain 5/10 pain in knee. Premedicated.    ADL  Eating/Feeding: Independent Where Assessed - Eating/Feeding: Chair Grooming: Wash/dry hands;Min guard Where Assessed - Grooming: Supported standing Upper Body Bathing: Set up Where Assessed - Upper Body Bathing: Unsupported sitting Lower Body Bathing: Minimal assistance Where Assessed - Lower Body Bathing: Supported sit to stand Upper Body Dressing: Set up Where Assessed - Upper Body Dressing: Unsupported sitting Lower Body Dressing: Minimal assistance Where Assessed - Lower Body Dressing: Supported sit to stand Toilet Transfer: Performed;Min guard Statistician Method: Sit to Barista: Raised toilet seat with arms (or 3-in-1 over toilet) Toileting - Clothing Manipulation and Hygiene: Minimal assistance;Supervision/safety  (clothing-Min A and hygiene-supervision) Where Assessed - Toileting Clothing Manipulation and Hygiene: Sit to stand from 3-in-1 or toilet;Sit on 3-in-1 or toilet (hygiene-sitting and clothing-sit to stand) Tub/Shower Transfer Method: Not assessed Equipment Used: Gait belt;Knee Immobilizer;Rolling walker ADL Comments: Pt at Min A level for LB ADLs. Educated pt to avoid twisting knee when donning/doffing sock.     OT Diagnosis: Acute pain  OT Problem List: Decreased strength;Decreased range of motion;Impaired balance (sitting and/or standing);Decreased knowledge of use of DME or AE;Decreased knowledge of precautions;Pain;Decreased safety awareness OT Treatment Interventions: Self-care/ADL training;DME and/or AE instruction;Therapeutic activities;Patient/family education;Balance training   OT Goals Acute Rehab OT Goals OT Goal Formulation: With patient Time For Goal Achievement: 07/01/12 Potential to Achieve Goals: Good ADL Goals Pt Will Perform Lower Body Bathing: with modified independence;Sit to stand from chair ADL Goal: Lower Body Bathing - Progress: Goal set today Pt Will Perform Lower Body Dressing: with modified independence;Sit to stand from bed;Sit to stand from chair ADL Goal: Lower Body Dressing - Progress: Goal set today Pt Will Transfer to Toilet: with modified independence;Ambulation;with DME ADL Goal: Toilet Transfer - Progress: Goal set today Pt Will Perform Toileting - Clothing Manipulation: with modified independence;Standing ADL Goal: Toileting - Clothing Manipulation - Progress: Goal set today Pt Will Perform Toileting - Hygiene: with modified independence;Sit to stand from 3-in-1/toilet ADL Goal: Toileting - Hygiene - Progress: Goal set today Pt Will Perform Tub/Shower Transfer: with supervision;Ambulation;with DME;Shower transfer ADL Goal: Web designer - Progress: Goal set today  Visit Information  Last OT Received On: 06/24/12 Assistance Needed: +1 PT/OT  Co-Evaluation/Treatment: Yes    Subjective Data      Prior Functioning     Home Living Lives With: Family;Son Available Help at Discharge: Family;Available 24 hours/day Type of  Home: House Home Access: Stairs to enter [employer] of Steps: 5 Entrance Stairs-Rails: Right;Left Home Layout: One level Bathroom Shower/Tub: Walk-in shower;Tub/shower unit Teacher, early years/pre: Yes How Accessible: Accessible via walker Home Adaptive Equipment: Quad cane Prior Function Level of Independence: Independent with assistive device(s) Driving: No Communication Communication: Other (comment) (Son translates for patient and speaks [place]) Dominant Hand: Right         Vision/Perception     Cognition  Cognition Arousal/Alertness: Awake/alert Behavior During Therapy: WFL for tasks assessed/performed Overall Cognitive Status: Within Functional Limits for tasks assessed    Extremity/Trunk Assessment Right Upper Extremity Assessment RUE ROM/Strength/Tone: WFL for tasks assessed Left Upper Extremity Assessment LUE ROM/Strength/Tone: WFL for tasks assessed      Mobility Bed Mobility Bed Mobility: Supine to Sit;Sitting - Scoot to Edge of Bed Supine to Sit: 4: Min assist;HOB elevated;With rails Sitting - Scoot to Edge of Bed: 4: Min assist Details for Bed Mobility Assistance: (A) to advance L LE to/off EOB and control descent of L LE to floor; requires increased time due to pain  Transfers Transfers: Sit to Stand;Stand to Sit Sit to Stand: 4: Min assist;4: Min guard;With upper extremity assist;From bed;From chair/3-in-1 Stand to Sit: 4: Min assist;With upper extremity assist;To chair/3-in-1 Details for Transfer Assistance: Min G for sit to stand from 3 in 1. performed transfer bed <>  3 in 1 <> recliner; requires verbal cues for hand placement and sequencing, Son able to translate, (A) to steady and balance in upright position due to pain in L LE  with WB            End of Session OT - End of Session Equipment Utilized During Treatment: Gait belt;Left knee immobilizer Activity Tolerance: Patient tolerated treatment well Patient left: in chair;with call bell/phone within reach;with family/visitor present CPM Left Knee CPM Left Knee: Off  GO     Earlie Raveling OTR/L [phone] 06/24/2012, 1:47 PM

## 2012-06-25 ENCOUNTER — Encounter (HOSPITAL_COMMUNITY): Payer: Self-pay | Admitting: Orthopaedic Surgery

## 2012-06-25 LAB — CBC
Hemoglobin: 8.6 g/dL — ABNORMAL LOW (ref 12.0–15.0)
RBC: 3.18 MIL/uL — ABNORMAL LOW (ref 3.87–5.11)

## 2012-06-25 MED ORDER — HYDROCODONE-ACETAMINOPHEN 5-325 MG PO TABS: 1.0000 | ORAL_TABLET | ORAL | Status: DC | PRN

## 2012-06-25 MED ORDER — METHOCARBAMOL 500 MG PO TABS: 500.0000 mg | ORAL_TABLET | Freq: Four times a day (QID) | ORAL | Status: DC | PRN

## 2012-06-25 MED ORDER — ASPIRIN 325 MG PO TBEC: 325.0000 mg | DELAYED_RELEASE_TABLET | Freq: Two times a day (BID) | ORAL | Status: DC

## 2012-06-25 NOTE — Progress Notes (Signed)
Patient ambulated last night around 50 feet with walker and NA at 2100 last night.  Patient ambulated again this morning around 50 feet with walker and NA at 0600.

## 2012-06-25 NOTE — Progress Notes (Signed)
Physical Therapy Treatment Patient Details Name: Katherine Novak MRN: [id number] DOB: Nov 08, 1934 Today's Date: 06/25/2012 Time: 9528-4132 PT Time Calculation (min): 24 min  PT Assessment / Plan / Recommendation Comments on Treatment Session  PA notified PT that pt and family are ready for D/C when approved by PT. Pt and family educated and demo good ability to (A) pt with stair amb. Pt discussed home management stratigies and techniques with family. Pt is clear from PT standpoint to D/C with 24/7 (A) from family and HHPT.     Follow Up Recommendations  Home health PT;Supervision for mobility/OOB;Supervision - Intermittent     Does the patient have the potential to tolerate intense rehabilitation     Barriers to Discharge        Equipment Recommendations  Rolling walker with 5" wheels;Other (comment)    Recommendations for Other Services    Frequency 7X/week   Plan Discharge plan remains appropriate;Frequency remains appropriate    Precautions / Restrictions Precautions Precautions: Knee;Fall Required Braces or Orthoses: Knee Immobilizer - Left Knee Immobilizer - Left: Other (comment) Restrictions Weight Bearing Restrictions: Yes LLE Weight Bearing: Weight bearing as tolerated   Pertinent Vitals/Pain Grimaces with knee flex; denies need for pain medicine.     Mobility  Bed Mobility Bed Mobility: Supine to Sit Supine to Sit: 4: Min assist;HOB elevated;With rails Details for Bed Mobility Assistance: (A) to advance L LE off EOB and control descent to floor Transfers Transfers: Sit to Stand;Stand to Sit Sit to Stand: 4: Min guard;From bed;With upper extremity assist Stand to Sit: 4: Min guard;To chair/3-in-1;With armrests;With upper extremity assist Details for Transfer Assistance: min guard for transfer to steady; pt requries verbal cues for hand placement and safety with RW Ambulation/Gait Ambulation/Gait Assistance: 4: Min guard Ambulation Distance (Feet): 10  Feet Assistive device: Rolling walker Ambulation/Gait Assistance Details: multimodal cues for gt sequencing and RW safety; tactile cues for upright posture  Gait Pattern: Step-to pattern;Step-through pattern;Decreased stance time - left;Decreased step length - right;Trunk flexed Gait velocity: decreased due to pain  Stairs: Yes Stairs Assistance: 4: Min assist Stairs Assistance Details (indicate cue type and reason): pt and family given handout and educated on proper gt sequencing and techniques on guarding. family demo good ability to guard and (A) pt on steps   Stair Management Technique: No rails;Backwards;With walker;Step to pattern Number of Stairs: 3 Wheelchair Mobility Wheelchair Mobility: No    Exercises Total Joint Exercises Ankle Circles/Pumps: AAROM;AROM;Both;10 reps;Seated Heel Slides: AAROM;Right;10 reps;Seated Hip ABduction/ADduction: AAROM;Left;10 reps;Seated Straight Leg Raises: Left;10 reps;AAROM Long Arc Quad: AAROM;Left;10 reps;Seated Goniometric ROM: -4 to 80 degrees AROM in sitting    PT Diagnosis:    PT Problem List:   PT Treatment Interventions:     PT Goals Acute Rehab PT Goals PT Goal Formulation: With patient Time For Goal Achievement: 07/01/12 Potential to Achieve Goals: Good Pt will go Supine/Side to Sit: with modified independence PT Goal: Supine/Side to Sit - Progress: Progressing toward goal PT Goal: Sit to Stand - Progress: Progressing toward goal PT Goal: Stand to Sit - Progress: Progressing toward goal PT Goal: Ambulate - Progress: Progressing toward goal PT Goal: Up/Down Stairs - Progress: Progressing toward goal PT Goal: Perform Home Exercise Program - Progress: Progressing toward goal  Visit Information  Last PT Received On: 06/25/12 Assistance Needed: +1    Subjective Data  Subjective: PT notified by PA that pt is going home and needs to address steps; pt requesting to go to bathroom prior to session Patient  Stated Goal: home today     Cognition  Cognition Arousal/Alertness: Awake/alert Behavior During Therapy: WFL for tasks assessed/performed Overall Cognitive Status: Within Functional Limits for tasks assessed    Balance  Balance Balance Assessed: No  End of Session PT - End of Session Equipment Utilized During Treatment: Gait belt;Left knee immobilizer Activity Tolerance: Patient tolerated treatment well Patient left: in bed;with family/visitor present Nurse Communication: Mobility status   GP     Katherine Novak, Katherine Novak [place] [phone] 06/25/2012, 11:46 AM

## 2012-06-25 NOTE — Progress Notes (Signed)
Subjective: 2 Days Post-Op Procedure(s) (LRB): TOTAL KNEE ARTHROPLASTY (Left)  Activity level:  wbat Diet tolerance:  ok Voiding:  ok Patient reports pain as 3 on 0-10 scale.    Objective: Vital signs in last 24 hours: Temp:  [98.4 F (36.9 C)-99.9 F (37.7 C)] 98.4 F (36.9 C) (06/19 0531) Pulse Rate:  [75-106] 75 (06/19 0531) Resp:  [18] 18 (06/19 0531) BP: (118-129)/(54-67) 118/54 mmHg (06/19 0531) SpO2:  [91 %-100 %] 100 % (06/19 0531)  Labs:  Recent Labs  06/24/12 0413 06/25/12 0740  HGB 9.0* 8.6*    Recent Labs  06/24/12 0413 06/25/12 0740  WBC 10.9* 19.6*  RBC 3.39* 3.18*  HCT 28.8* 27.0*  PLT 144* 141*    Recent Labs  06/24/12 0413  NA 136  K 4.2  CL 103  CO2 26  BUN 11  CREATININE 0.53  GLUCOSE 169*  CALCIUM 8.6   No results found for this basename: LABPT, INR,  in the last 72 hours  Physical Exam:  Neurologically intact ABD soft Neurovascular intact Sensation intact distally Intact pulses distally Dorsiflexion/Plantar flexion intact No cellulitis present Compartment soft  Assessment/Plan:  2 Days Post-Op Procedure(s) (LRB): TOTAL KNEE ARTHROPLASTY (Left) Advance diet Up with therapy Discharge home with home health after 1 more pt session. And no n/v Asa 325 bid x 2 weeks    Fouad Taul R 06/25/2012, 10:02 AM

## 2012-06-25 NOTE — Discharge Summary (Signed)
Patient ID: Katherine Novak MRN: [id number] DOB/AGE: 1934-06-29 78 y.o.  Admit date: 06/23/2012 Discharge date: 06/25/2012  Admission Diagnoses:  Principal Problem:   Left knee DJD   Discharge Diagnoses:  Same  Past Medical History  Diagnosis Date  . Hypertension   . Hypercholesterolemia   . GERD (gastroesophageal reflux disease)   . Barrett's esophagus   . Vitamin D deficiency   . Osteoporosis   . Arthritis   . AAA (abdominal aortic aneurysm)   . Leukemia     CLL observation  Katherine Novak    Surgeries: Procedure(s): TOTAL KNEE ARTHROPLASTY on 06/23/2012   Consultants:    Discharged Condition: Improved  Hospital Course: Katherine Novak is an 78 y.o. female who was admitted 06/23/2012 for operative treatment ofLeft knee DJD. Patient has severe unremitting pain that affects sleep, daily activities, and work/hobbies. After pre-op clearance the patient was taken to the operating room on 06/23/2012 and underwent  Procedure(s): TOTAL KNEE ARTHROPLASTY.    Patient was given perioperative antibiotics: Anti-infectives   Start     Dose/Rate Route Frequency Ordered Stop   06/23/12 1800  ceFAZolin (ANCEF) IVPB 2 g/50 mL premix     2 g 100 mL/hr over 30 Minutes Intravenous Every 6 hours 06/23/12 1626 06/24/12 0011   06/23/12 0600  ceFAZolin (ANCEF) IVPB 2 g/50 mL premix     2 g 100 mL/hr over 30 Minutes Intravenous On call to O.R. 06/22/12 1414 06/23/12 1153       Patient was given sequential compression devices, early ambulation, and chemoprophylaxis to prevent DVT.  Patient benefited maximally from hospital stay and there were no complications.    Recent vital signs: Patient Vitals for the past 24 hrs:  BP Temp Temp src Pulse Resp SpO2  06/25/12 0531 118/54 mmHg 98.4 F (36.9 C) Oral 75 18 100 %  06/24/12 2251 129/67 mmHg - - 82 - -  06/24/12 2250 129/62 mmHg - - - - -  06/24/12 2042 118/62 mmHg 99.9 F (37.7 C) Oral 106 18 91 %  06/24/12 1543 - - - - 18 -   06/24/12 1253 121/61 mmHg 98.6 F (37 C) - 92 18 91 %  06/24/12 1200 - - - - 18 -     Recent laboratory studies:  Recent Labs  06/24/12 0413 06/25/12 0740  WBC 10.9* 19.6*  HGB 9.0* 8.6*  HCT 28.8* 27.0*  PLT 144* 141*  NA 136  --   K 4.2  --   CL 103  --   CO2 26  --   BUN 11  --   CREATININE 0.53  --   GLUCOSE 169*  --   CALCIUM 8.6  --      Discharge Medications:     Medication List    STOP taking these medications       acetaminophen 325 MG tablet  Commonly known as:  TYLENOL     diclofenac sodium 1 % Gel  Commonly known as:  VOLTAREN     HYDROcodone-acetaminophen 5-500 MG per tablet  Commonly known as:  VICODIN     ibuprofen 200 MG tablet  Commonly known as:  ADVIL,MOTRIN      TAKE these medications       alendronate 70 MG tablet  Commonly known as:  FOSAMAX  Take 70 mg by mouth every 7 (seven) days. Take with a full glass of water on an empty stomach.     aspirin 325 MG EC tablet  Take 1  tablet (325 mg total) by mouth 2 (two) times daily.     diphenhydrAMINE 25 mg capsule  Commonly known as:  BENADRYL  Take 2 capsules (50 mg total) by mouth every 6 (six) hours as needed (for allergic reaction).     HYDROcodone-acetaminophen 5-325 MG per tablet  Commonly known as:  NORCO/VICODIN  Take 1-2 tablets by mouth every 4 (four) hours as needed.     methocarbamol 500 MG tablet  Commonly known as:  ROBAXIN  Take 1 tablet (500 mg total) by mouth every 6 (six) hours as needed.     metoprolol tartrate 25 MG tablet  Commonly known as:  LOPRESSOR  Take 25 mg by mouth 2 (two) times daily.     omeprazole 20 MG capsule  Commonly known as:  PRILOSEC  Take 20 mg by mouth daily.     rosuvastatin 5 MG tablet  Commonly known as:  CRESTOR  Take 5 mg by mouth daily.        Diagnostic Studies: Dg Chest 2 View  06/23/2012   *RADIOLOGY REPORT*  Clinical Data: Preoperative evaluation for knee replacement. History of hypertension  CHEST - 2 VIEW  Comparison:  05/07/2011  Findings: The cardiac shadow is stable.  The lungs are well- aerated.  Mild interstitial changes are again seen.  The previously seen nodular density is less well visualized.  Mild degenerate change of the thoracic spine is seen.  IMPRESSION: No acute abnormality noted.   Original Report Authenticated By: Alcide Clever, M.D.   Mm Digital Screening  06/11/2012   *RADIOLOGY REPORT*  Clinical Data: Screening.  DIGITAL SCREENING BILATERAL MAMMOGRAM WITH CAD  Comparison:  Previous exams.  FINDINGS:  ACR Breast Density Category 2: There is a scattered fibroglandular pattern.  There are no findings suspicious for malignancy.  Images were processed with CAD.  IMPRESSION: No mammographic evidence of malignancy.  A result letter of this screening mammogram will be mailed directly to the patient.  RECOMMENDATION: Screening mammogram in one year. (Code:SM-B-01Y)  BI-RADS CATEGORY 1:  Negative.   Original Report Authenticated By: Sherian Rein, M.D.    Disposition: 01-Home or Self Care      Discharge Orders   Future Orders Complete By Expires     Call MD / Call 911  As directed     Comments:      If you experience chest pain or shortness of breath, CALL 911 and be transported to the hospital emergency room.  If you develope a fever above 101 F, pus (white drainage) or increased drainage or redness at the wound, or calf pain, call your surgeon's office.    Constipation Prevention  As directed     Comments:      Drink plenty of fluids.  Prune juice may be helpful.  You may use a stool softener, such as Colace (over the counter) 100 mg twice a day.  Use MiraLax (over the counter) for constipation as needed.    Diet - low sodium heart healthy  As directed     Increase activity slowly as tolerated  As directed        Follow-up Information   Follow up with DALLDORF,PETER G, MD. Call in 2 weeks.   Contact information:   [address] [place] [address] [phone]         Signed: Prince Rome 06/25/2012, 10:07 AM

## 2012-06-25 NOTE — Progress Notes (Signed)
Physical Therapy Treatment Patient Details Name: Katherine Novak MRN: [id number] DOB: [birth year] Today's Date: 06/25/2012 Time: 7829-5621 PT Time Calculation (min): 24 min  PT Assessment / Plan / Recommendation Comments on Treatment Session  Pt is progressing steadily with therapy. Anticipate pt to be appropriate for D/C tomorrow if medically ready. Will plan to attempt steps during next session.     Follow Up Recommendations  Home health PT;Supervision for mobility/OOB;Supervision - Intermittent     Does the patient have the potential to tolerate intense rehabilitation     Barriers to Discharge        Equipment Recommendations  Rolling walker with 5" wheels;Other (comment)    Recommendations for Other Services    Frequency 7X/week   Plan Discharge plan remains appropriate;Frequency remains appropriate    Precautions / Restrictions Precautions Precautions: Knee;Fall Required Braces or Orthoses: Knee Immobilizer - Left Knee Immobilizer - Left: Other (comment) Restrictions Weight Bearing Restrictions: Yes LLE Weight Bearing: Weight bearing as tolerated   Pertinent Vitals/Pain Per son pt is having pain but denies need for pain medicine; did not rate pain.     Mobility  Bed Mobility Bed Mobility: Not assessed (pt sitting in chair ) Transfers Transfers: Sit to Stand;Stand to Sit Sit to Stand: 4: Min guard;From chair/3-in-1;With armrests Stand to Sit: To chair/3-in-1;4: Min guard;With armrests Details for Transfer Assistance: min guard for transfers to steady due to pain with WB through L LE. tactile cues for hand placement and demo safe technique with RW to ensure carry over  Ambulation/Gait Ambulation/Gait Assistance: 4: Min guard Ambulation Distance (Feet): 80 Feet Assistive device: Rolling walker Ambulation/Gait Assistance Details: multimodal cues for gt sequencing to encourage equal step lengths and to demo safe technique with RW; pt able to progress to step through gt  with multimodal cues; cues for upright posture; pt begins to flex at trunk when fatigued  Gait Pattern: Step-to pattern;Step-through pattern;Decreased stance time - left;Decreased step length - right;Trunk flexed Gait velocity: decreased due to pain  Stairs: No Wheelchair Mobility Wheelchair Mobility: No    Exercises Total Joint Exercises Ankle Circles/Pumps: AAROM;AROM;Both;10 reps;Seated Heel Slides: AAROM;Right;10 reps;Seated Hip ABduction/ADduction: AAROM;Left;10 reps;Seated Straight Leg Raises: Left;10 reps;AAROM Long Arc Quad: AAROM;Left;10 reps;Seated Goniometric ROM: -4 to 80 degrees AROM in sitting    PT Diagnosis:    PT Problem List:   PT Treatment Interventions:     PT Goals Acute Rehab PT Goals PT Goal Formulation: With patient Time For Goal Achievement: 07/01/12 Pt will go Supine/Side to Sit: with modified independence PT Goal: Sit to Stand - Progress: Progressing toward goal PT Goal: Stand to Sit - Progress: Progressing toward goal PT Goal: Ambulate - Progress: Progressing toward goal PT Goal: Perform Home Exercise Program - Progress: Progressing toward goal  Visit Information  Last PT Received On: 06/25/12 Assistance Needed: +1    Subjective Data  Subjective: pt sitting in chair with son present to translate Patient Stated Goal: son states "home tomorrow"    Cognition  Cognition Arousal/Alertness: Awake/alert Behavior During Therapy: WFL for tasks assessed/performed Overall Cognitive Status: Within Functional Limits for tasks assessed    Balance  Balance Balance Assessed: No  End of Session PT - End of Session Equipment Utilized During Treatment: Gait belt;Left knee immobilizer Activity Tolerance: Patient tolerated treatment well Patient left: Other (comment);with call bell/phone within reach (in bathroom with son present) Nurse Communication: Mobility status;Other (comment) (pt in bathroom)   GP     Katherine Novak, [place] [phone] 06/25/2012, 9:36  AM

## 2012-06-29 ENCOUNTER — Other Ambulatory Visit (HOSPITAL_COMMUNITY): Payer: Self-pay | Admitting: Orthopaedic Surgery

## 2012-06-29 ENCOUNTER — Ambulatory Visit (HOSPITAL_COMMUNITY)
Admission: RE | Admit: 2012-06-29 | Discharge: 2012-06-29 | Disposition: A | Discharge status: Home / Self Care | Payer: PRIVATE HEALTH INSURANCE | Source: Ambulatory Visit | Attending: Orthopaedic Surgery | Admitting: Orthopaedic Surgery

## 2012-06-29 DIAGNOSIS — M7989 Other specified soft tissue disorders: Secondary | ICD-10-CM

## 2012-06-29 DIAGNOSIS — R599 Enlarged lymph nodes, unspecified: Secondary | ICD-10-CM | POA: Insufficient documentation

## 2012-06-29 DIAGNOSIS — Z96659 Presence of unspecified artificial knee joint: Secondary | ICD-10-CM | POA: Insufficient documentation

## 2012-06-29 DIAGNOSIS — M25562 Pain in left knee: Secondary | ICD-10-CM

## 2012-06-29 DIAGNOSIS — M1712 Unilateral primary osteoarthritis, left knee: Secondary | ICD-10-CM

## 2012-06-29 DIAGNOSIS — M79609 Pain in unspecified limb: Secondary | ICD-10-CM | POA: Insufficient documentation

## 2012-06-29 DIAGNOSIS — M25569 Pain in unspecified knee: Secondary | ICD-10-CM | POA: Insufficient documentation

## 2012-06-29 NOTE — Progress Notes (Signed)
Bilateral lower extremity venous duplex completed.  Right:  No evidence of DVT, superficial thrombosis, or Baker's cyst.  Left: DVT noted in the peroneal vein.  No evidence of superficial thrombosis.  No Baker's cyst.   

## 2012-07-08 ENCOUNTER — Telehealth: Payer: Self-pay | Admitting: *Deleted

## 2012-07-08 DIAGNOSIS — C9110 Chronic lymphocytic leukemia of B-cell type not having achieved remission: Secondary | ICD-10-CM

## 2012-07-08 NOTE — Telephone Encounter (Signed)
CBC sent over from [facility] at [place].  Dr Ali Lowe office called wanting to know if Dr Donnald Garre reviewed CBC and if Dr Donnald Garre would like to f/u with pt.  Pt was last seen 04/2011, he never r/s after cancelling his f/u on 10/2011.  Per Dr Donnald Garre, okay to schedule lab and f/u in the next few weeks.  Onc tx schedule filled out.  Left message with Dr Ali Lowe RN Liborio Nixon [phone] and informed her of Dr [facility] orders.

## 2012-07-13 ENCOUNTER — Telehealth: Payer: Self-pay | Admitting: Medical Oncology

## 2012-07-13 ENCOUNTER — Telehealth: Payer: Self-pay | Admitting: Internal Medicine

## 2012-07-13 NOTE — Telephone Encounter (Signed)
Confirmed appts . 

## 2012-07-20 ENCOUNTER — Telehealth: Payer: Self-pay | Admitting: Internal Medicine

## 2012-07-20 NOTE — Telephone Encounter (Signed)
pt called to r/s....Done °

## 2012-07-29 ENCOUNTER — Ambulatory Visit: Payer: PRIVATE HEALTH INSURANCE | Admitting: Internal Medicine

## 2012-07-29 ENCOUNTER — Other Ambulatory Visit: Payer: PRIVATE HEALTH INSURANCE | Admitting: Lab

## 2012-07-30 ENCOUNTER — Telehealth: Payer: Self-pay | Admitting: Internal Medicine

## 2012-07-30 ENCOUNTER — Encounter: Payer: Self-pay | Admitting: Internal Medicine

## 2012-07-30 ENCOUNTER — Other Ambulatory Visit (HOSPITAL_BASED_OUTPATIENT_CLINIC_OR_DEPARTMENT_OTHER): Payer: PRIVATE HEALTH INSURANCE | Admitting: Lab

## 2012-07-30 ENCOUNTER — Ambulatory Visit (HOSPITAL_BASED_OUTPATIENT_CLINIC_OR_DEPARTMENT_OTHER): Payer: PRIVATE HEALTH INSURANCE | Admitting: Internal Medicine

## 2012-07-30 VITALS — BP 145/80 | HR 95 | Temp 97.0°F | Resp 17 | Ht 60.0 in | Wt 144.0 lb

## 2012-07-30 DIAGNOSIS — C9110 Chronic lymphocytic leukemia of B-cell type not having achieved remission: Secondary | ICD-10-CM | POA: Insufficient documentation

## 2012-07-30 LAB — CBC WITH DIFFERENTIAL/PLATELET
BASO%: 0.3 % (ref 0.0–2.0)
EOS%: 1.2 % (ref 0.0–7.0)
HCT: 30.7 % — ABNORMAL LOW (ref 34.8–46.6)
LYMPH%: 52.4 % — ABNORMAL HIGH (ref 14.0–49.7)
MCH: 26.5 pg (ref 25.1–34.0)
MCHC: 32.1 g/dL (ref 31.5–36.0)
NEUT%: 40.2 % (ref 38.4–76.8)
RBC: 3.72 10*6/uL (ref 3.70–5.45)
lymph#: 4.7 10*3/uL — ABNORMAL HIGH (ref 0.9–3.3)

## 2012-07-30 LAB — LACTATE DEHYDROGENASE (CC13): LDH: 215 U/L (ref 125–245)

## 2012-07-30 NOTE — Progress Notes (Signed)
[facility] Telephone:([phone]) [phone]   Fax:(336) [phone]  OFFICE PROGRESS NOTE  Emeterio Reeve, MD [address] [place] [address]  DIAGNOSIS: Chronic lymphocytic leukemia, stage 0.  PRIOR THERAPY: None  CURRENT THERAPY: Observation.  INTERVAL HISTORY: Katherine Novak 78 y.o. female returns to the clinic today for six-month followup visit accompanied by her son. The patient is feeling fine today with no specific complaints. She recently underwent left knee replacement. She denied having any significant weight loss or night sweats. She denied having any nausea or vomiting. The patient denied having any palpable lymphadenopathy. She has no chest pain, shortness breath, cough or hemoptysis. She had repeat CBC performed earlier today and she is here for evaluation and discussion of her lab results.  MEDICAL HISTORY: Past Medical History  Diagnosis Date  . Hypertension   . Hypercholesterolemia   . GERD (gastroesophageal reflux disease)   . Barrett's esophagus   . Vitamin D deficiency   . Osteoporosis   . Arthritis   . AAA (abdominal aortic aneurysm)   . Leukemia     CLL observation  dr Gwenyth Bouillon cone    ALLERGIES:  is allergic to sulfa antibiotics.  MEDICATIONS:  Current Outpatient Prescriptions  Medication Sig Dispense Refill  . alendronate (FOSAMAX) 70 MG tablet Take 70 mg by mouth every 7 (seven) days. Take with a full glass of water on an empty stomach.      Marland Kitchen aspirin EC 325 MG EC tablet Take 1 tablet (325 mg total) by mouth 2 (two) times daily.  30 tablet  0  . furosemide (LASIX) 20 MG tablet Take 20 mg by mouth daily as needed.      Marland Kitchen HYDROcodone-acetaminophen (NORCO/VICODIN) 5-325 MG per tablet Take 1-2 tablets by mouth every 4 (four) hours as needed.  60 tablet  0  . methocarbamol (ROBAXIN) 500 MG tablet Take 1 tablet (500 mg total) by mouth every 6 (six) hours as needed.  40 tablet  1  . metoprolol tartrate (LOPRESSOR) 25 MG tablet Take  25 mg by mouth 2 (two) times daily.      . rosuvastatin (CRESTOR) 5 MG tablet Take 5 mg by mouth daily.      . diphenhydrAMINE (BENADRYL) 25 mg capsule Take 2 capsules (50 mg total) by mouth every 6 (six) hours as needed (for allergic reaction).      Marland Kitchen omeprazole (PRILOSEC) 20 MG capsule Take 20 mg by mouth daily.       No current facility-administered medications for this visit.    SURGICAL HISTORY:  Past Surgical History  Procedure Laterality Date  . Carpel tunnel-left    . Cataracts  bilateral  . Eye surgery      BIL CAT  . Total knee arthroplasty Left 06/23/2012    Dr Yisroel Ramming  . Total knee arthroplasty Left 06/23/2012    Procedure: TOTAL KNEE ARTHROPLASTY;  Surgeon: Velna Ochs, MD;  Location: [facility] OR;  Service: Orthopedics;  Laterality: Left;  DEPUY, RNFA    REVIEW OF SYSTEMS:  A comprehensive review of systems was negative.   PHYSICAL EXAMINATION: General appearance: alert, cooperative and no distress Head: Normocephalic, without obvious abnormality, atraumatic Neck: no adenopathy Lymph nodes: Cervical, supraclavicular, and axillary nodes normal. Resp: clear to auscultation bilaterally Cardio: regular rate and rhythm, S1, S2 normal, no murmur, click, rub or gallop GI: soft, non-tender; bowel sounds normal; no masses,  no organomegaly Extremities: extremities normal, atraumatic, no cyanosis or edema  ECOG PERFORMANCE  STATUS: 1 - Symptomatic but completely ambulatory  Blood pressure 145/80, pulse 95, temperature 97 F (36.1 C), temperature source Oral, resp. rate 17, height 5' (1.524 m), weight 144 lb (65.318 kg).  LABORATORY DATA: Lab Results  Component Value Date   WBC 9.0 07/30/2012   HGB 9.9* 07/30/2012   HCT 30.7* 07/30/2012   MCV 82.7 07/30/2012   PLT 191 07/30/2012      Chemistry      Component Value Date/Time   NA 136 06/24/2012 0413   K 4.2 06/24/2012 0413   CL 103 06/24/2012 0413   CO2 26 06/24/2012 0413   BUN 11 06/24/2012 0413   CREATININE 0.53 06/24/2012  0413      Component Value Date/Time   CALCIUM 8.6 06/24/2012 0413   ALKPHOS 84 04/23/2011 1522   AST 18 04/23/2011 1522   ALT 12 04/23/2011 1522   BILITOT 0.2* 04/23/2011 1522       RADIOGRAPHIC STUDIES: No results found.  ASSESSMENT AND PLAN: This is a very pleasant 78 years old [place] female with history of chronic lymphocytic leukemia stage 0. The patient is doing fine today with no significant worsening of her disease. Her CBC is unremarkable except for mild anemia most likely secondary to blood loss during her knee surgery. I recommended for the patient to take oral iron tablet for her anemia. I would see her back for followup visit in 6 months with repeat CBC, comprehensive metabolic panel and LDH. She was advised to call immediately if she has any concerning symptoms in the interval.  All questions were answered. The patient knows to call the clinic with any problems, questions or concerns. We can certainly see the patient much sooner if necessary.

## 2012-07-30 NOTE — Telephone Encounter (Signed)
Gave pt appt for January 2015 lab and MD

## 2012-08-01 NOTE — Patient Instructions (Signed)
No significant evidence for disease progression.  Continue on observation with repeat CBC, comprehensive metabolic panel and LDH in 6 month

## 2012-08-12 ENCOUNTER — Other Ambulatory Visit: Payer: Self-pay

## 2012-08-19 ENCOUNTER — Ambulatory Visit: Payer: PRIVATE HEALTH INSURANCE | Admitting: Physical Therapy

## 2012-08-20 ENCOUNTER — Ambulatory Visit: Payer: PRIVATE HEALTH INSURANCE | Attending: Orthopaedic Surgery

## 2012-08-20 DIAGNOSIS — M25569 Pain in unspecified knee: Secondary | ICD-10-CM | POA: Insufficient documentation

## 2012-08-20 DIAGNOSIS — IMO0001 Reserved for inherently not codable concepts without codable children: Secondary | ICD-10-CM | POA: Insufficient documentation

## 2012-08-20 DIAGNOSIS — R262 Difficulty in walking, not elsewhere classified: Secondary | ICD-10-CM | POA: Insufficient documentation

## 2012-08-20 DIAGNOSIS — Z96659 Presence of unspecified artificial knee joint: Secondary | ICD-10-CM | POA: Insufficient documentation

## 2012-08-20 DIAGNOSIS — R609 Edema, unspecified: Secondary | ICD-10-CM | POA: Insufficient documentation

## 2012-08-24 ENCOUNTER — Encounter: Payer: PRIVATE HEALTH INSURANCE | Admitting: Rehabilitation

## 2012-08-26 ENCOUNTER — Ambulatory Visit: Payer: PRIVATE HEALTH INSURANCE | Admitting: Rehabilitation

## 2012-08-27 ENCOUNTER — Telehealth: Payer: Self-pay | Admitting: *Deleted

## 2012-08-27 NOTE — Telephone Encounter (Signed)
Office note from [facility] given to Dr Donnald Garre to review.  SLJ

## 2012-08-31 ENCOUNTER — Ambulatory Visit: Payer: PRIVATE HEALTH INSURANCE

## 2012-09-02 ENCOUNTER — Ambulatory Visit: Payer: PRIVATE HEALTH INSURANCE | Admitting: Physical Therapy

## 2012-11-12 ENCOUNTER — Other Ambulatory Visit: Payer: Self-pay

## 2013-01-29 ENCOUNTER — Other Ambulatory Visit: Payer: PRIVATE HEALTH INSURANCE | Admitting: Lab

## 2013-02-01 ENCOUNTER — Other Ambulatory Visit (HOSPITAL_BASED_OUTPATIENT_CLINIC_OR_DEPARTMENT_OTHER): Payer: PRIVATE HEALTH INSURANCE

## 2013-02-01 ENCOUNTER — Encounter: Payer: Self-pay | Admitting: Internal Medicine

## 2013-02-01 ENCOUNTER — Ambulatory Visit (HOSPITAL_BASED_OUTPATIENT_CLINIC_OR_DEPARTMENT_OTHER): Payer: Medicare Other | Admitting: Internal Medicine

## 2013-02-01 ENCOUNTER — Telehealth: Payer: Self-pay | Admitting: Internal Medicine

## 2013-02-01 VITALS — BP 131/56 | HR 91 | Temp 97.5°F | Resp 18 | Ht 60.0 in | Wt 143.8 lb

## 2013-02-01 DIAGNOSIS — C9110 Chronic lymphocytic leukemia of B-cell type not having achieved remission: Secondary | ICD-10-CM

## 2013-02-01 LAB — CBC WITH DIFFERENTIAL/PLATELET
BASO%: 0.5 % (ref 0.0–2.0)
BASOS ABS: 0.1 10*3/uL (ref 0.0–0.1)
EOS ABS: 0.3 10*3/uL (ref 0.0–0.5)
EOS%: 2.4 % (ref 0.0–7.0)
HEMATOCRIT: 34.6 % — AB (ref 34.8–46.6)
HEMOGLOBIN: 10.8 g/dL — AB (ref 11.6–15.9)
LYMPH%: 62.4 % — AB (ref 14.0–49.7)
MCH: 25.2 pg (ref 25.1–34.0)
MCHC: 31.4 g/dL — AB (ref 31.5–36.0)
MCV: 80.2 fL (ref 79.5–101.0)
MONO#: 0.4 10*3/uL (ref 0.1–0.9)
MONO%: 3.8 % (ref 0.0–14.0)
NEUT%: 30.9 % — AB (ref 38.4–76.8)
NEUTROS ABS: 3.3 10*3/uL (ref 1.5–6.5)
PLATELETS: 177 10*3/uL (ref 145–400)
RBC: 4.31 10*6/uL (ref 3.70–5.45)
RDW: 17.1 % — ABNORMAL HIGH (ref 11.2–14.5)
WBC: 10.6 10*3/uL — AB (ref 3.9–10.3)
lymph#: 6.6 10*3/uL — ABNORMAL HIGH (ref 0.9–3.3)

## 2013-02-01 LAB — COMPREHENSIVE METABOLIC PANEL (CC13)
ALT: 14 U/L (ref 0–55)
ANION GAP: 10 meq/L (ref 3–11)
AST: 19 U/L (ref 5–34)
Albumin: 3.8 g/dL (ref 3.5–5.0)
Alkaline Phosphatase: 80 U/L (ref 40–150)
BILIRUBIN TOTAL: 0.28 mg/dL (ref 0.20–1.20)
BUN: 11.5 mg/dL (ref 7.0–26.0)
CALCIUM: 9.4 mg/dL (ref 8.4–10.4)
CHLORIDE: 109 meq/L (ref 98–109)
CO2: 24 meq/L (ref 22–29)
CREATININE: 0.7 mg/dL (ref 0.6–1.1)
GLUCOSE: 129 mg/dL (ref 70–140)
Potassium: 4 mEq/L (ref 3.5–5.1)
Sodium: 143 mEq/L (ref 136–145)
Total Protein: 6.6 g/dL (ref 6.4–8.3)

## 2013-02-01 LAB — LACTATE DEHYDROGENASE (CC13): LDH: 110 U/L — AB (ref 125–245)

## 2013-02-01 LAB — TECHNOLOGIST REVIEW

## 2013-02-01 NOTE — Telephone Encounter (Signed)
lvm for pt regarding to July appt...mailed pt appt sched, avs and letter

## 2013-02-01 NOTE — Progress Notes (Signed)
[facility] Telephone:([phone]   Fax:(336) [phone]  OFFICE PROGRESS NOTE  Katherine Coma, MD [address] [place] [address]  DIAGNOSIS: Chronic lymphocytic leukemia, stage 0.  PRIOR THERAPY: None  CURRENT THERAPY: Observation.  INTERVAL HISTORY: Katherine Novak 79 y.o. female returns to the clinic today for six-month followup visit accompanied by her son. The patient is feeling fine today with no specific complaints. She underwent left knee replacement in June of 2014 and expected to have the right knee replacement this summer. She had no significant weight loss or night sweats. She denied having any nausea or vomiting. The patient denied having any palpable lymphadenopathy. She has no chest pain, shortness breath, cough or hemoptysis. She had repeat CBC performed earlier today and she is here for evaluation and discussion of her lab results.  MEDICAL HISTORY: Past Medical History  Diagnosis Date  . Hypertension   . Hypercholesterolemia   . GERD (gastroesophageal reflux disease)   . Barrett's esophagus   . Vitamin D deficiency   . Osteoporosis   . Arthritis   . AAA (abdominal aortic aneurysm)   . Leukemia     CLL observation  dr Inda Merlin cone    Katherine Novak:  is allergic to sulfa antibiotics.  MEDICATIONS:  Current Outpatient Prescriptions  Medication Sig Dispense Refill  . alendronate (FOSAMAX) 70 MG tablet Take 70 mg by mouth every 7 (seven) days. Take with a full glass of water on an empty stomach.      Marland Kitchen aspirin EC 325 MG EC tablet Take 1 tablet (325 mg total) by mouth 2 (two) times daily.  30 tablet  0  . furosemide (LASIX) 20 MG tablet Take 20 mg by mouth daily as needed.      Marland Kitchen HYDROcodone-acetaminophen (NORCO/VICODIN) 5-325 MG per tablet Take 1-2 tablets by mouth every 4 (four) hours as needed.  60 tablet  0  . methocarbamol (ROBAXIN) 500 MG tablet Take 1 tablet (500 mg total) by mouth every 6 (six) hours as needed.   40 tablet  1  . metoprolol tartrate (LOPRESSOR) 25 MG tablet Take 25 mg by mouth 2 (two) times daily.      Marland Kitchen omeprazole (PRILOSEC) 20 MG capsule Take 20 mg by mouth daily.      . rosuvastatin (CRESTOR) 5 MG tablet Take 5 mg by mouth daily.      . diphenhydrAMINE (BENADRYL) 25 mg capsule Take 2 capsules (50 mg total) by mouth every 6 (six) hours as needed (for allergic reaction).       No current facility-administered medications for this visit.    SURGICAL HISTORY:  Past Surgical History  Procedure Laterality Date  . Carpel tunnel-left    . Cataracts  bilateral  . Eye surgery      BIL CAT  . Total knee arthroplasty Left 06/23/2012    Dr Latanya Maudlin  . Total knee arthroplasty Left 06/23/2012    Procedure: TOTAL KNEE ARTHROPLASTY;  Surgeon: Hessie Dibble, MD;  Location: [facility];  Service: Orthopedics;  Laterality: Left;  DEPUY, RNFA    REVIEW OF SYSTEMS:  A comprehensive review of systems was negative.   PHYSICAL EXAMINATION: General appearance: alert, cooperative and no distress Head: Normocephalic, without obvious abnormality, atraumatic Neck: no adenopathy Lymph nodes: Cervical, supraclavicular, and axillary nodes normal. Resp: clear to auscultation bilaterally Cardio: regular rate and rhythm, S1, S2 normal, no murmur, click, rub or gallop GI: soft, non-tender; bowel sounds normal; no masses,  no organomegaly Extremities: extremities normal, atraumatic, no cyanosis or edema  ECOG PERFORMANCE STATUS: 1 - Symptomatic but completely ambulatory  Blood pressure 131/56, pulse 91, temperature 97.5 F (36.4 C), temperature source Oral, resp. rate 18, height 5' (1.524 m), weight 143 lb 12.8 oz (65.227 kg), SpO2 100.00%.  LABORATORY DATA: Lab Results  Component Value Date   WBC 10.6* 02/01/2013   HGB 10.8* 02/01/2013   HCT 34.6* 02/01/2013   MCV 80.2 02/01/2013   PLT 177 02/01/2013      Chemistry      Component Value Date/Time   NA 136 06/24/2012 0413   K 4.2 06/24/2012 0413   CL 103  06/24/2012 0413   CO2 26 06/24/2012 0413   BUN 11 06/24/2012 0413   CREATININE 0.53 06/24/2012 0413      Component Value Date/Time   CALCIUM 8.6 06/24/2012 0413   ALKPHOS 84 04/23/2011 1522   AST 18 04/23/2011 1522   ALT 12 04/23/2011 1522   BILITOT 0.2* 04/23/2011 1522       RADIOGRAPHIC STUDIES: No results found.  ASSESSMENT AND PLAN: This is a very pleasant 79 years old [place] female with history of chronic lymphocytic leukemia stage 0. The patient is doing fine today with no significant worsening of her disease.  Her anemia is improving and I recommended for her to continue with the oral iron tablet I would see her back for followup visit in 6 months with repeat CBC, comprehensive metabolic panel and LDH. She was advised to call immediately if she has any concerning symptoms in the interval.  All questions were answered. The patient knows to call the clinic with any problems, questions or concerns. We can certainly see the patient much sooner if necessary.

## 2013-02-01 NOTE — Patient Instructions (Signed)
Followup visit in 6 months with repeat CBC and LDH

## 2013-07-01 ENCOUNTER — Other Ambulatory Visit: Payer: Self-pay | Admitting: Orthopaedic Surgery

## 2013-07-27 NOTE — Pre-Procedure Instructions (Addendum)
Katherine Novak  07/27/2013   Your procedure is scheduled on:  08/10/13  Report to [facility] short stay admitting at 830 AM.  Call this number if you have problems the morning of surgery: [phone]   Remember:   Do not eat food or drink liquids after midnight.   Take these medicines the morning of surgery with A SIP OF WATER: , pain med if needed  Metoprolol, prilosec     Take all meds until day of surgery except as instructed below or per dr    Katherine Novak all herbel meds, nsaids (aleve,naproxen,advil,ibuprofen) 5 days prior to surgery(08/05/13) including aspirin, vitamins,meloxicam   Do not wear jewelry, make-up or nail polish.  Do not wear lotions, powders, or perfumes. You may wear deodorant.  Do not shave 48 hours prior to surgery. Men may shave face and neck.  Do not bring valuables to the hospital.  [facility] is not responsible                  for any belongings or valuables.               Contacts, dentures or bridgework may not be worn into surgery.  Leave suitcase in the car. After surgery it may be brought to your room.  For patients admitted to the hospital, discharge time is determined by your                treatment team.               Patients discharged the day of surgery will not be allowed to drive  home.  Name and phone number of your driver:   Special Instructions:  Special Instructions: Katherine Novak - Preparing for Surgery  Before surgery, you can play an important role.  Because skin is not sterile, your skin needs to be as free of germs as possible.  You can reduce the number of germs on you skin by washing with CHG (chlorahexidine gluconate) soap before surgery.  CHG is an antiseptic cleaner which kills germs and bonds with the skin to continue killing germs even after washing.  Please DO NOT use if you have an allergy to CHG or antibacterial soaps.  If your skin becomes reddened/irritated stop using the CHG and inform your nurse when you arrive at Short  Stay.  Do not shave (including legs and underarms) for at least 48 hours prior to the first CHG shower.  You may shave your face.  Please follow these instructions carefully:   1.  Shower with CHG Soap the night before surgery and the morning of Surgery.  2.  If you choose to wash your hair, wash your hair first as usual with your normal shampoo.  3.  After you shampoo, rinse your hair and body thoroughly to remove the Shampoo.  4.  Use CHG as you would any other liquid soap.  You can apply chg directly  to the skin and wash gently with scrungie or a clean washcloth.  5.  Apply the CHG Soap to your body ONLY FROM THE NECK DOWN.  Do not use on open wounds or open sores.  Avoid contact with your eyes ears, mouth and genitals (private parts).  Wash genitals (private parts)       with your normal soap.  6.  Wash thoroughly, paying special attention to the area where your surgery will be performed.  7.  Thoroughly rinse your body with warm water from the neck  down.  8.  DO NOT shower/wash with your normal soap after using and rinsing off the CHG Soap.  9.  Pat yourself dry with a clean towel.            10.  Wear clean pajamas.            11.  Place clean sheets on your bed the night of your first shower and do not sleep with pets.  Day of Surgery  Do not apply any lotions/deodorants the morning of surgery.  Please wear clean clothes to the hospital/surgery center.   Please read over the following fact sheets that you were given: Pain Booklet, Coughing and Deep Breathing, Blood Transfusion Information, MRSA Information and Surgical Site Infection Prevention

## 2013-07-28 ENCOUNTER — Encounter (HOSPITAL_COMMUNITY): Payer: Self-pay

## 2013-07-28 ENCOUNTER — Encounter (HOSPITAL_COMMUNITY)
Admission: RE | Admit: 2013-07-28 | Discharge: 2013-07-28 | Disposition: A | Discharge status: Home / Self Care | Payer: Medicare Other | Source: Ambulatory Visit | Attending: Orthopaedic Surgery | Admitting: Orthopaedic Surgery

## 2013-07-28 DIAGNOSIS — Z01810 Encounter for preprocedural cardiovascular examination: Secondary | ICD-10-CM | POA: Diagnosis not present

## 2013-07-28 DIAGNOSIS — Z01818 Encounter for other preprocedural examination: Secondary | ICD-10-CM | POA: Insufficient documentation

## 2013-07-28 DIAGNOSIS — Z01812 Encounter for preprocedural laboratory examination: Secondary | ICD-10-CM | POA: Insufficient documentation

## 2013-07-28 HISTORY — DX: Personal history of urinary (tract) infections: Z87.440

## 2013-07-28 HISTORY — DX: Disease of blood and blood-forming organs, unspecified: D75.9

## 2013-07-28 LAB — URINE MICROSCOPIC-ADD ON

## 2013-07-28 LAB — PROTIME-INR
INR: 0.97 (ref 0.00–1.49)
Prothrombin Time: 12.9 seconds (ref 11.6–15.2)

## 2013-07-28 LAB — CBC WITH DIFFERENTIAL/PLATELET
BASOS ABS: 0 10*3/uL (ref 0.0–0.1)
BASOS PCT: 0 % (ref 0–1)
EOS ABS: 0.1 10*3/uL (ref 0.0–0.7)
Eosinophils Relative: 1 % (ref 0–5)
HCT: 35.1 % — ABNORMAL LOW (ref 36.0–46.0)
HEMOGLOBIN: 10.8 g/dL — AB (ref 12.0–15.0)
LYMPHS PCT: 59 % — AB (ref 12–46)
Lymphs Abs: 6.4 10*3/uL — ABNORMAL HIGH (ref 0.7–4.0)
MCH: 25.3 pg — AB (ref 26.0–34.0)
MCHC: 30.8 g/dL (ref 30.0–36.0)
MCV: 82.2 fL (ref 78.0–100.0)
MONO ABS: 0.4 10*3/uL (ref 0.1–1.0)
Monocytes Relative: 4 % (ref 3–12)
NEUTROS ABS: 3.9 10*3/uL (ref 1.7–7.7)
Neutrophils Relative %: 36 % — ABNORMAL LOW (ref 43–77)
Platelets: 173 10*3/uL (ref 150–400)
RBC: 4.27 MIL/uL (ref 3.87–5.11)
RDW: 16.4 % — AB (ref 11.5–15.5)
WBC: 10.8 10*3/uL — ABNORMAL HIGH (ref 4.0–10.5)

## 2013-07-28 LAB — URINALYSIS, ROUTINE W REFLEX MICROSCOPIC
BILIRUBIN URINE: NEGATIVE
Glucose, UA: NEGATIVE mg/dL
Hgb urine dipstick: NEGATIVE
Ketones, ur: NEGATIVE mg/dL
NITRITE: NEGATIVE
Protein, ur: NEGATIVE mg/dL
SPECIFIC GRAVITY, URINE: 1.021 (ref 1.005–1.030)
UROBILINOGEN UA: 0.2 mg/dL (ref 0.0–1.0)
pH: 6 (ref 5.0–8.0)

## 2013-07-28 LAB — BASIC METABOLIC PANEL
ANION GAP: 14 (ref 5–15)
BUN: 14 mg/dL (ref 6–23)
CALCIUM: 9.3 mg/dL (ref 8.4–10.5)
CHLORIDE: 103 meq/L (ref 96–112)
CO2: 24 meq/L (ref 19–32)
CREATININE: 0.6 mg/dL (ref 0.50–1.10)
GFR calc Af Amer: >90 mL/min (ref >90)
GFR calc non Af Amer: 86 mL/min — ABNORMAL LOW (ref >90)
Glucose, Bld: 124 mg/dL — ABNORMAL HIGH (ref 70–99)
Potassium: 4.1 mEq/L (ref 3.7–5.3)
Sodium: 141 mEq/L (ref 137–147)

## 2013-07-28 LAB — APTT: APTT: 31 s (ref 24–37)

## 2013-07-28 LAB — SURGICAL PCR SCREEN
MRSA, PCR: NEGATIVE
STAPHYLOCOCCUS AUREUS: NEGATIVE

## 2013-07-28 IMAGING — CR DG CHEST 2V
2 series · 2 of 2 positions shown · non-contrast
Comparison: [DATE]

CLINICAL DATA: History of abdominal aortic aneurysm, hypertension,
and leukemia, for preoperative evaluation prior to knee replacement
surgery

EXAM:
CHEST  2 VIEW

[w chest pa]
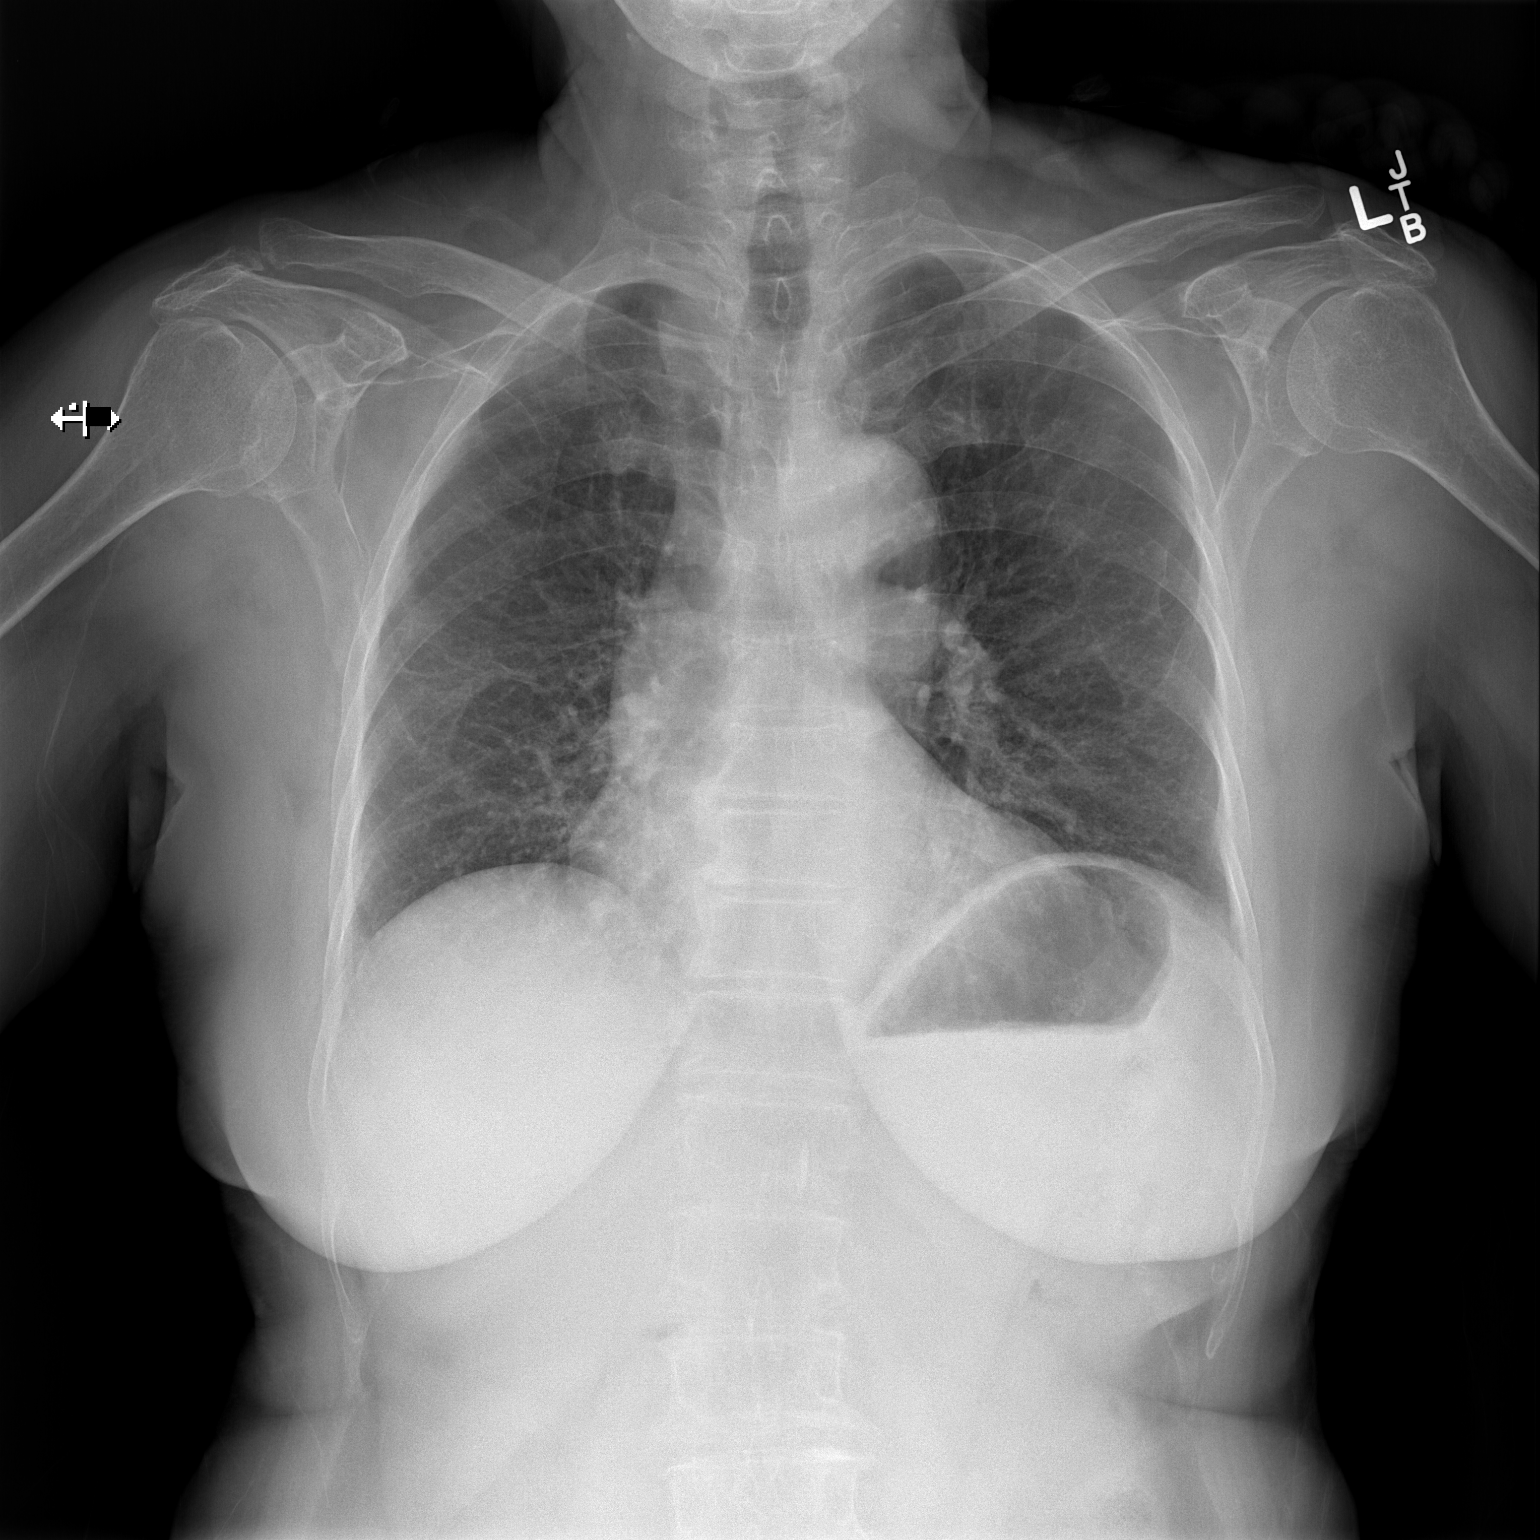

[w chest lat]
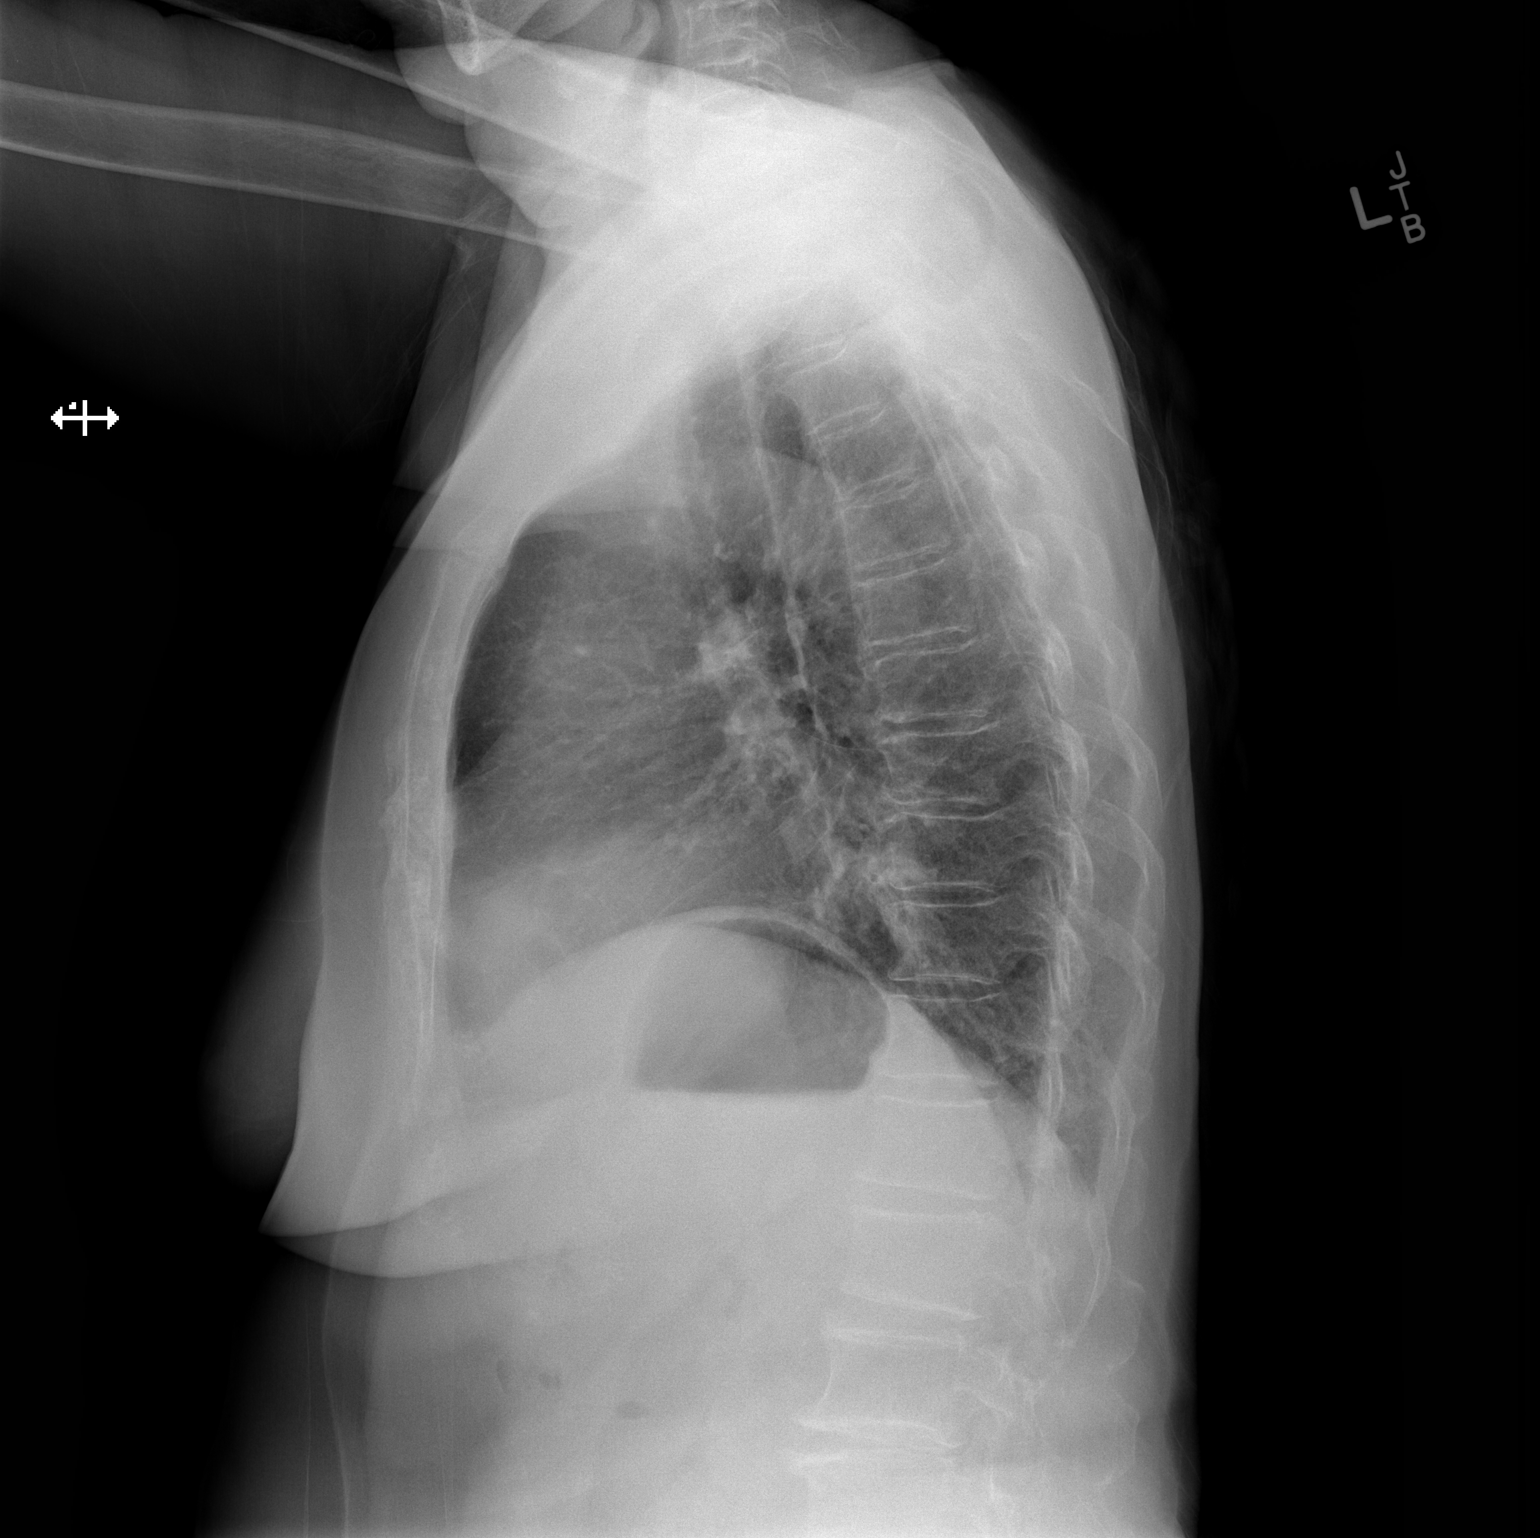

[2 of 2 positions shown; findings below may reference images not displayed]

FINDINGS: The heart size and vascular pattern are normal. There is uncoiling
of the aorta which is stable. Lungs are clear. No pleural effusion.
Bony thorax is intact.
IMPRESSION: No active cardiopulmonary disease.

## 2013-07-29 LAB — PATHOLOGIST SMEAR REVIEW

## 2013-07-29 NOTE — Progress Notes (Signed)
Anesthesia chart review: Patient is a 79 year old female scheduled for right TKA on 08/10/13 by Dr. Rhona Raider.    History includes nonsmoker, hypertension, "AAA" (2013 notes from Dr. Dannial Monarch mention 2.6-2.8 suprarenal AAA 02/2002; chest CT on 05/13/11 showed ascending aorta 3.7 cm), hypercholesterolemia, GERD, Barrett's esophagus, vitamin D deficiency, osteoporosis, arthritis, leukemia (CLL; monitored by Dr. Julien Nordmann), recurrent UTIs, cataract extraction, left TKA 06/23/12. PCP is Dr. [name].  HEM-ONC is Dr. Julien Nordmann, next visit scheduled for 08/03/13. BP 113/75 at PAT.    EKG on 07/28/13 showed: NSR, septal infarct (age undetermined). Since last tracing rate is faster.    CXR on 07/28/13 showed: No active cardiopulmonary disease.  Preoperative labs noted. WBC 10.8.   Patient tolerated TKA last year.  Aneurysm history reviewed with anesthesiologist Dr. Marcie Bal.  No CV symptoms reported at PAT.  If no acute changes then it is anticipated that she can proceed as planned.  [facility] Short Stay Center/Anesthesiology Phone [phone] 07/29/2013 4:32 PM

## 2013-07-30 ENCOUNTER — Telehealth: Payer: Self-pay | Admitting: Internal Medicine

## 2013-07-30 NOTE — Telephone Encounter (Signed)
pt called to cx appt and will call back to r/s °

## 2013-08-03 ENCOUNTER — Other Ambulatory Visit: Payer: PRIVATE HEALTH INSURANCE

## 2013-08-03 ENCOUNTER — Ambulatory Visit: Payer: PRIVATE HEALTH INSURANCE | Admitting: Internal Medicine

## 2013-08-06 NOTE — H&P (Signed)
TOTAL KNEE ADMISSION H&P  Patient is being admitted for right total knee arthroplasty.  Subjective:  Chief Complaint:right knee pain.  HPI: Katherine Novak, 79 y.o. female, has a history of pain and functional disability in the right knee due to arthritis and has failed non-surgical conservative treatments for greater than 12 weeks to includeNSAID's and/or analgesics, corticosteriod injections, flexibility and strengthening excercises, use of assistive devices, weight reduction as appropriate and activity modification.  Onset of symptoms was gradual, starting 5 years ago with gradually worsening course since that time. The patient noted no past surgery on the right knee(s).  Patient currently rates pain in the right knee(s) at 10 out of 10 with activity. Patient has night pain, worsening of pain with activity and weight bearing, pain that interferes with activities of daily living and crepitus.  Patient has evidence of subchondral cysts, subchondral sclerosis, periarticular osteophytes and joint space narrowing by imaging studies. There is no active infection.  Patient Active Problem List   Diagnosis Date Noted  . CLL (chronic lymphocytic leukemia) 07/30/2012  . Left knee DJD 06/23/2012    Class: Chronic  . Hypercholesterolemia    Past Medical History  Diagnosis Date  . Hypertension   . Hypercholesterolemia   . GERD (gastroesophageal reflux disease)   . Barrett's esophagus   . Vitamin D deficiency   . Osteoporosis   . Arthritis   . AAA (abdominal aortic aneurysm)   . Leukemia     CLL observation  dr Inda Mer[facility]  . History of recurrent UTIs   . Blood dyscrasia     leukemia cll    Past Surgical History  Procedure Laterality Date  . Carpel tunnel-left    . Cataracts  bilateral  . Eye surgery      BIL CAT  . Total knee arthroplasty Left 06/23/2012    Dr Latanya Maudlin  . Total knee arthroplasty Left 06/23/2012    Procedure: TOTAL KNEE ARTHROPLASTY;  Surgeon: Hessie Dibble, MD;   Location: [facility];  Service: Orthopedics;  Laterality: Left;  DEPUY, RNFA    No prescriptions prior to admission   Allergies  Allergen Reactions  . Sulfa Antibiotics     Angioedema of lips , throat    History  Substance Use Topics  . Smoking status: Never Smoker   . Smokeless tobacco: Never Used  . Alcohol Use: No    Family History  Problem Relation Age of Onset  . Cancer Brother      Review of Systems  Musculoskeletal: Positive for joint pain.       Right knee    Objective:  Physical Exam  Constitutional: She is oriented to person, place, and time. She appears well-developed and well-nourished.  HENT:  Head: Normocephalic and atraumatic.  Eyes: Conjunctivae are normal. Pupils are equal, round, and reactive to light.  Neck: Normal range of motion.  Cardiovascular: Normal rate and regular rhythm.   Respiratory: Effort normal.  GI: Soft.  Musculoskeletal:  Both knees move about 5-95.  The left side has a healed incision and on the right side she has crepitation and medial joint line pain.  Hip motion is full and straight leg raise is negative.  Sensation and motor function are intact in her feet with palpable pulses on both sides.  She has no palpable lymphadenopathy behind either knee.    Neurological: She is alert and oriented to person, place, and time.  Skin: Skin is warm and dry.  Psychiatric: She has a normal mood and  affect. Her behavior is normal. Judgment and thought content normal.    Vital signs in last 24 hours:    Labs:   Estimated body mass index is 28.08 kg/(m^2) as calculated from the following:   Height as of 02/01/13: 5' (1.524 m).   Weight as of 02/01/13: 65.227 kg (143 lb 12.8 oz).   Imaging Review Plain radiographs demonstrate severe degenerative joint disease of the right knee(s). The overall alignment ismild varus. The bone quality appears to be good for age and reported activity level.  Assessment/Plan:  End stage arthritis, right knee    The patient history, physical examination, clinical judgment of the provider and imaging studies are consistent with end stage degenerative joint disease of the right knee(s) and total knee arthroplasty is deemed medically necessary. The treatment options including medical management, injection therapy arthroscopy and arthroplasty were discussed at length. The risks and benefits of total knee arthroplasty were presented and reviewed. The risks due to aseptic loosening, infection, stiffness, patella tracking problems, thromboembolic complications and other imponderables were discussed. The patient acknowledged the explanation, agreed to proceed with the plan and consent was signed. Patient is being admitted for inpatient treatment for surgery, pain control, PT, OT, prophylactic antibiotics, VTE prophylaxis, progressive ambulation and ADL's and discharge planning. The patient is planning to be discharged home with home health services

## 2013-08-09 MED ORDER — CEFAZOLIN SODIUM-DEXTROSE 2-3 GM-% IV SOLR
2.0000 g | INTRAVENOUS | Status: AC
  Administered 2013-08-10: 2 g via INTRAVENOUS
  Filled 2013-08-09: qty 50

## 2013-08-09 MED ORDER — CHLORHEXIDINE GLUCONATE 4 % EX LIQD
60.0000 mL | Freq: Once | CUTANEOUS | Status: DC
  Filled 2013-08-09: qty 60

## 2013-08-10 ENCOUNTER — Encounter (HOSPITAL_COMMUNITY)
Admission: RE | Disposition: A | Discharge status: Home Health | Payer: Self-pay | Source: Ambulatory Visit | Attending: Orthopaedic Surgery

## 2013-08-10 ENCOUNTER — Encounter (HOSPITAL_COMMUNITY): Payer: Medicare Other | Admitting: Vascular Surgery

## 2013-08-10 ENCOUNTER — Encounter (HOSPITAL_COMMUNITY): Payer: Self-pay | Admitting: Certified Registered"

## 2013-08-10 ENCOUNTER — Inpatient Hospital Stay (HOSPITAL_COMMUNITY)
Admission: RE | Admit: 2013-08-10 | Discharge: 2013-08-13 | DRG: 470 | Disposition: A | Discharge status: Home Health | Payer: Medicare Other | Source: Ambulatory Visit | Attending: Orthopaedic Surgery | Admitting: Orthopaedic Surgery

## 2013-08-10 ENCOUNTER — Inpatient Hospital Stay (HOSPITAL_COMMUNITY): Payer: Medicare Other | Admitting: Certified Registered"

## 2013-08-10 DIAGNOSIS — I714 Abdominal aortic aneurysm, without rupture, unspecified: Secondary | ICD-10-CM | POA: Diagnosis present

## 2013-08-10 DIAGNOSIS — Z96659 Presence of unspecified artificial knee joint: Secondary | ICD-10-CM | POA: Diagnosis not present

## 2013-08-10 DIAGNOSIS — Z23 Encounter for immunization: Secondary | ICD-10-CM

## 2013-08-10 DIAGNOSIS — M25569 Pain in unspecified knee: Secondary | ICD-10-CM | POA: Diagnosis present

## 2013-08-10 DIAGNOSIS — K219 Gastro-esophageal reflux disease without esophagitis: Secondary | ICD-10-CM | POA: Diagnosis present

## 2013-08-10 DIAGNOSIS — M810 Age-related osteoporosis without current pathological fracture: Secondary | ICD-10-CM | POA: Diagnosis present

## 2013-08-10 DIAGNOSIS — E559 Vitamin D deficiency, unspecified: Secondary | ICD-10-CM | POA: Diagnosis present

## 2013-08-10 DIAGNOSIS — Z882 Allergy status to sulfonamides status: Secondary | ICD-10-CM | POA: Diagnosis not present

## 2013-08-10 DIAGNOSIS — E7800 Pure hypercholesterolemia, unspecified: Secondary | ICD-10-CM | POA: Diagnosis present

## 2013-08-10 DIAGNOSIS — D62 Acute posthemorrhagic anemia: Secondary | ICD-10-CM | POA: Diagnosis not present

## 2013-08-10 DIAGNOSIS — C9110 Chronic lymphocytic leukemia of B-cell type not having achieved remission: Secondary | ICD-10-CM | POA: Diagnosis present

## 2013-08-10 DIAGNOSIS — M1710 Unilateral primary osteoarthritis, unspecified knee: Secondary | ICD-10-CM | POA: Diagnosis present

## 2013-08-10 DIAGNOSIS — M1711 Unilateral primary osteoarthritis, right knee: Secondary | ICD-10-CM | POA: Diagnosis present

## 2013-08-10 DIAGNOSIS — I10 Essential (primary) hypertension: Secondary | ICD-10-CM | POA: Diagnosis present

## 2013-08-10 HISTORY — PX: TOTAL KNEE ARTHROPLASTY: SHX125

## 2013-08-10 LAB — TYPE AND SCREEN
ABO/RH(D): O POS
ABO/RH(D): O POS
Antibody Screen: NEGATIVE
Antibody Screen: NEGATIVE

## 2013-08-10 SURGERY — ARTHROPLASTY, KNEE, TOTAL
Anesthesia: Monitor Anesthesia Care | Site: Knee | Laterality: Right

## 2013-08-10 MED ORDER — METOPROLOL TARTRATE 25 MG PO TABS
25.0000 mg | ORAL_TABLET | Freq: Two times a day (BID) | ORAL | Status: DC
  Administered 2013-08-10 – 2013-08-13 (×6): 25 mg via ORAL
  Filled 2013-08-10 (×9): qty 1

## 2013-08-10 MED ORDER — MENTHOL 3 MG MT LOZG: 1.0000 | LOZENGE | OROMUCOSAL | Status: DC | PRN

## 2013-08-10 MED ORDER — FENTANYL CITRATE 0.05 MG/ML IJ SOLN
25.0000 ug | INTRAMUSCULAR | Status: DC | PRN
  Administered 2013-08-10 (×4): 25 ug via INTRAVENOUS

## 2013-08-10 MED ORDER — FUROSEMIDE 20 MG PO TABS
20.0000 mg | ORAL_TABLET | Freq: Every day | ORAL | Status: DC | PRN
  Filled 2013-08-10: qty 1

## 2013-08-10 MED ORDER — FENTANYL CITRATE 0.05 MG/ML IJ SOLN
INTRAMUSCULAR | Status: AC
  Administered 2013-08-10: 50 ug
  Filled 2013-08-10: qty 2

## 2013-08-10 MED ORDER — ALENDRONATE SODIUM 70 MG PO TABS: 70.0000 mg | ORAL_TABLET | ORAL | Status: DC

## 2013-08-10 MED ORDER — ASPIRIN EC 325 MG PO TBEC
325.0000 mg | DELAYED_RELEASE_TABLET | Freq: Two times a day (BID) | ORAL | Status: DC
  Administered 2013-08-11 – 2013-08-13 (×5): 325 mg via ORAL
  Filled 2013-08-10 (×7): qty 1

## 2013-08-10 MED ORDER — LACTATED RINGERS IV SOLN
INTRAVENOUS | Status: DC
  Administered 2013-08-10: 19:00:00 via INTRAVENOUS

## 2013-08-10 MED ORDER — FENTANYL CITRATE 0.05 MG/ML IJ SOLN
INTRAMUSCULAR | Status: DC | PRN
  Administered 2013-08-10: 50 ug via INTRAVENOUS

## 2013-08-10 MED ORDER — PHENYLEPHRINE HCL 10 MG/ML IJ SOLN
INTRAMUSCULAR | Status: DC | PRN
  Administered 2013-08-10: 40 ug via INTRAVENOUS
  Administered 2013-08-10 (×2): 80 ug via INTRAVENOUS

## 2013-08-10 MED ORDER — MIDAZOLAM HCL 2 MG/2ML IJ SOLN
INTRAMUSCULAR | Status: AC
  Administered 2013-08-10: 1 mg
  Filled 2013-08-10: qty 2

## 2013-08-10 MED ORDER — SODIUM CHLORIDE 0.9 % IR SOLN
Status: DC | PRN
  Administered 2013-08-10: 1000 mL

## 2013-08-10 MED ORDER — MIDAZOLAM HCL 5 MG/5ML IJ SOLN
INTRAMUSCULAR | Status: DC | PRN
  Administered 2013-08-10: 2 mg via INTRAVENOUS

## 2013-08-10 MED ORDER — HYDROMORPHONE HCL PF 1 MG/ML IJ SOLN
0.5000 mg | INTRAMUSCULAR | Status: DC | PRN
  Administered 2013-08-10: 1 mg via INTRAVENOUS
  Filled 2013-08-10: qty 1

## 2013-08-10 MED ORDER — ALUM & MAG HYDROXIDE-SIMETH 200-200-20 MG/5ML PO SUSP: 30.0000 mL | ORAL | Status: DC | PRN

## 2013-08-10 MED ORDER — ONDANSETRON HCL 4 MG/2ML IJ SOLN
4.0000 mg | Freq: Four times a day (QID) | INTRAMUSCULAR | Status: DC | PRN
  Administered 2013-08-10: 4 mg via INTRAVENOUS
  Filled 2013-08-10 (×2): qty 2

## 2013-08-10 MED ORDER — ONDANSETRON HCL 4 MG PO TABS
4.0000 mg | ORAL_TABLET | Freq: Four times a day (QID) | ORAL | Status: DC | PRN
  Administered 2013-08-11 (×2): 4 mg via ORAL
  Filled 2013-08-10 (×2): qty 1

## 2013-08-10 MED ORDER — SODIUM CHLORIDE 0.9 % IR SOLN
Status: DC | PRN
  Administered 2013-08-10: 3000 mL

## 2013-08-10 MED ORDER — PANTOPRAZOLE SODIUM 40 MG PO TBEC
40.0000 mg | DELAYED_RELEASE_TABLET | Freq: Every day | ORAL | Status: DC
  Administered 2013-08-11 – 2013-08-13 (×3): 40 mg via ORAL
  Filled 2013-08-10 (×3): qty 1

## 2013-08-10 MED ORDER — LIDOCAINE HCL (CARDIAC) 20 MG/ML IV SOLN
INTRAVENOUS | Status: DC | PRN
  Administered 2013-08-10: 60 mg via INTRAVENOUS

## 2013-08-10 MED ORDER — METOCLOPRAMIDE HCL 10 MG PO TABS
5.0000 mg | ORAL_TABLET | Freq: Three times a day (TID) | ORAL | Status: DC | PRN
  Administered 2013-08-11: 10 mg via ORAL
  Filled 2013-08-10: qty 1

## 2013-08-10 MED ORDER — BISACODYL 5 MG PO TBEC
5.0000 mg | DELAYED_RELEASE_TABLET | Freq: Every day | ORAL | Status: DC | PRN
  Administered 2013-08-12: 5 mg via ORAL
  Filled 2013-08-10: qty 1

## 2013-08-10 MED ORDER — BUPIVACAINE LIPOSOME 1.3 % IJ SUSP
20.0000 mL | Freq: Once | INTRAMUSCULAR | Status: AC
  Administered 2013-08-10: 20 mL
  Filled 2013-08-10: qty 20

## 2013-08-10 MED ORDER — PHENOL 1.4 % MT LIQD: 1.0000 | OROMUCOSAL | Status: DC | PRN

## 2013-08-10 MED ORDER — DOCUSATE SODIUM 100 MG PO CAPS
100.0000 mg | ORAL_CAPSULE | Freq: Two times a day (BID) | ORAL | Status: DC
  Administered 2013-08-10 – 2013-08-13 (×6): 100 mg via ORAL
  Filled 2013-08-10 (×7): qty 1

## 2013-08-10 MED ORDER — ATORVASTATIN CALCIUM 10 MG PO TABS
10.0000 mg | ORAL_TABLET | Freq: Every day | ORAL | Status: DC
  Administered 2013-08-10 – 2013-08-12 (×3): 10 mg via ORAL
  Filled 2013-08-10 (×4): qty 1

## 2013-08-10 MED ORDER — CEFAZOLIN SODIUM-DEXTROSE 2-3 GM-% IV SOLR
2.0000 g | Freq: Four times a day (QID) | INTRAVENOUS | Status: AC
  Administered 2013-08-10 (×2): 2 g via INTRAVENOUS
  Filled 2013-08-10 (×3): qty 50

## 2013-08-10 MED ORDER — ACETAMINOPHEN 325 MG PO TABS: 650.0000 mg | ORAL_TABLET | Freq: Four times a day (QID) | ORAL | Status: DC | PRN

## 2013-08-10 MED ORDER — PROPOFOL 10 MG/ML IV BOLUS
INTRAVENOUS | Status: AC
  Filled 2013-08-10: qty 20

## 2013-08-10 MED ORDER — METOCLOPRAMIDE HCL 5 MG/ML IJ SOLN
5.0000 mg | Freq: Three times a day (TID) | INTRAMUSCULAR | Status: DC | PRN
  Filled 2013-08-10: qty 2

## 2013-08-10 MED ORDER — LACTATED RINGERS IV SOLN
INTRAVENOUS | Status: DC
  Administered 2013-08-10 (×2): via INTRAVENOUS

## 2013-08-10 MED ORDER — METHOCARBAMOL 500 MG PO TABS
500.0000 mg | ORAL_TABLET | Freq: Four times a day (QID) | ORAL | Status: DC | PRN
  Administered 2013-08-10 – 2013-08-13 (×5): 500 mg via ORAL
  Filled 2013-08-10 (×5): qty 1

## 2013-08-10 MED ORDER — MIDAZOLAM HCL 2 MG/2ML IJ SOLN
INTRAMUSCULAR | Status: AC
  Filled 2013-08-10: qty 2

## 2013-08-10 MED ORDER — PROPOFOL INFUSION 10 MG/ML OPTIME
INTRAVENOUS | Status: DC | PRN
  Administered 2013-08-10: 50 ug/kg/min via INTRAVENOUS

## 2013-08-10 MED ORDER — ROCURONIUM BROMIDE 50 MG/5ML IV SOLN
INTRAVENOUS | Status: AC
  Filled 2013-08-10: qty 1

## 2013-08-10 MED ORDER — PHENYLEPHRINE 40 MCG/ML (10ML) SYRINGE FOR IV PUSH (FOR BLOOD PRESSURE SUPPORT)
PREFILLED_SYRINGE | INTRAVENOUS | Status: AC
  Filled 2013-08-10: qty 10

## 2013-08-10 MED ORDER — SODIUM CHLORIDE 0.9 % IJ SOLN
INTRAMUSCULAR | Status: DC | PRN
  Administered 2013-08-10: 20 mL

## 2013-08-10 MED ORDER — DIPHENHYDRAMINE HCL 12.5 MG/5ML PO ELIX: 12.5000 mg | ORAL_SOLUTION | ORAL | Status: DC | PRN

## 2013-08-10 MED ORDER — METHOCARBAMOL 1000 MG/10ML IJ SOLN: 500.0000 mg | Freq: Four times a day (QID) | INTRAVENOUS | Status: DC | PRN

## 2013-08-10 MED ORDER — HYDROCODONE-ACETAMINOPHEN 10-325 MG PO TABS
1.0000 | ORAL_TABLET | ORAL | Status: DC | PRN
  Administered 2013-08-10 – 2013-08-13 (×9): 2 via ORAL
  Filled 2013-08-10 (×9): qty 2

## 2013-08-10 MED ORDER — LIDOCAINE HCL (CARDIAC) 20 MG/ML IV SOLN
INTRAVENOUS | Status: AC
  Filled 2013-08-10: qty 5

## 2013-08-10 MED ORDER — FENTANYL CITRATE 0.05 MG/ML IJ SOLN
INTRAMUSCULAR | Status: AC
  Filled 2013-08-10: qty 2

## 2013-08-10 MED ORDER — ONDANSETRON HCL 4 MG/2ML IJ SOLN: 4.0000 mg | Freq: Once | INTRAMUSCULAR | Status: DC | PRN

## 2013-08-10 MED ORDER — ACETAMINOPHEN 650 MG RE SUPP: 650.0000 mg | Freq: Four times a day (QID) | RECTAL | Status: DC | PRN

## 2013-08-10 MED ORDER — FENTANYL CITRATE 0.05 MG/ML IJ SOLN
INTRAMUSCULAR | Status: AC
  Filled 2013-08-10: qty 5

## 2013-08-10 SURGICAL SUPPLY — 66 items
APL SKNCLS STERI-STRIP NONHPOA (GAUZE/BANDAGES/DRESSINGS) ×1
BANDAGE ELASTIC 4 VELCRO ST LF (GAUZE/BANDAGES/DRESSINGS) ×2 IMPLANT
BANDAGE ELASTIC 6 VELCRO ST LF (GAUZE/BANDAGES/DRESSINGS) ×1 IMPLANT
BANDAGE ESMARK 6X9 LF (GAUZE/BANDAGES/DRESSINGS) ×1 IMPLANT
BENZOIN TINCTURE PRP APPL 2/3 (GAUZE/BANDAGES/DRESSINGS) ×1 IMPLANT
BLADE SAGITTAL 25.0X1.19X90 (BLADE) ×2 IMPLANT
BLADE SURG ROTATE 9660 (MISCELLANEOUS) IMPLANT
BNDG CMPR 9X6 STRL LF SNTH (GAUZE/BANDAGES/DRESSINGS) ×1
BNDG CMPR MED 10X6 ELC LF (GAUZE/BANDAGES/DRESSINGS) ×1
BNDG ELASTIC 6X10 VLCR STRL LF (GAUZE/BANDAGES/DRESSINGS) ×2 IMPLANT
BNDG ESMARK 6X9 LF (GAUZE/BANDAGES/DRESSINGS) ×2
BNDG GAUZE ELAST 4 BULKY (GAUZE/BANDAGES/DRESSINGS) ×3 IMPLANT
BOWL SMART MIX CTS (DISPOSABLE) ×2 IMPLANT
CAPT RP KNEE ×1 IMPLANT
CEMENT HV SMART SET (Cement) ×4 IMPLANT
CLSR STERI-STRIP ANTIMIC 1/2X4 (GAUZE/BANDAGES/DRESSINGS) ×1 IMPLANT
COVER SURGICAL LIGHT HANDLE (MISCELLANEOUS) ×2 IMPLANT
CUFF TOURNIQUET SINGLE 34IN LL (TOURNIQUET CUFF) ×2 IMPLANT
CUFF TOURNIQUET SINGLE 44IN (TOURNIQUET CUFF) IMPLANT
DRAPE EXTREMITY T 121X128X90 (DRAPE) ×2 IMPLANT
DRAPE PROXIMA HALF (DRAPES) ×2 IMPLANT
DRAPE U-SHAPE 47X51 STRL (DRAPES) ×2 IMPLANT
DRSG ADAPTIC 3X8 NADH LF (GAUZE/BANDAGES/DRESSINGS) ×2 IMPLANT
DRSG PAD ABDOMINAL 8X10 ST (GAUZE/BANDAGES/DRESSINGS) ×2 IMPLANT
DURAPREP 26ML APPLICATOR (WOUND CARE) ×2 IMPLANT
ELECT REM PT RETURN 9FT ADLT (ELECTROSURGICAL) ×2
ELECTRODE REM PT RTRN 9FT ADLT (ELECTROSURGICAL) ×1 IMPLANT
GAUZE SPONGE 4X4 12PLY STRL (GAUZE/BANDAGES/DRESSINGS) ×2 IMPLANT
GLOVE BIO SURGEON STRL SZ8 (GLOVE) ×4 IMPLANT
GLOVE BIOGEL PI IND STRL 8 (GLOVE) ×2 IMPLANT
GLOVE BIOGEL PI INDICATOR 8 (GLOVE) ×2
GOWN STRL REUS W/ TWL LRG LVL3 (GOWN DISPOSABLE) ×1 IMPLANT
GOWN STRL REUS W/ TWL XL LVL3 (GOWN DISPOSABLE) ×2 IMPLANT
GOWN STRL REUS W/TWL 2XL LVL3 (GOWN DISPOSABLE) ×2 IMPLANT
GOWN STRL REUS W/TWL LRG LVL3 (GOWN DISPOSABLE) ×2
GOWN STRL REUS W/TWL XL LVL3 (GOWN DISPOSABLE) ×4
HANDPIECE INTERPULSE COAX TIP (DISPOSABLE) ×2
HOOD PEEL AWAY FACE SHEILD DIS (HOOD) ×4 IMPLANT
IMMOBILIZER KNEE 20 (SOFTGOODS) ×1 IMPLANT
IMMOBILIZER KNEE 22 UNIV (SOFTGOODS) ×1 IMPLANT
IMMOBILIZER KNEE 24 THIGH 36 (MISCELLANEOUS) IMPLANT
IMMOBILIZER KNEE 24 UNIV (MISCELLANEOUS)
KIT BASIN OR (CUSTOM PROCEDURE TRAY) ×2 IMPLANT
KIT ROOM TURNOVER OR (KITS) ×2 IMPLANT
MANIFOLD NEPTUNE II (INSTRUMENTS) ×2 IMPLANT
NDL HYPO 21X1 ECLIPSE (NEEDLE) ×1 IMPLANT
NEEDLE 22X1 1/2 (OR ONLY) (NEEDLE) ×1 IMPLANT
NEEDLE HYPO 21X1 ECLIPSE (NEEDLE) ×2 IMPLANT
NS IRRIG 1000ML POUR BTL (IV SOLUTION) ×2 IMPLANT
PACK TOTAL JOINT (CUSTOM PROCEDURE TRAY) ×2 IMPLANT
PAD ARMBOARD 7.5X6 YLW CONV (MISCELLANEOUS) ×4 IMPLANT
SET HNDPC FAN SPRY TIP SCT (DISPOSABLE) ×1 IMPLANT
SPONGE GAUZE 4X4 12PLY STER LF (GAUZE/BANDAGES/DRESSINGS) ×1 IMPLANT
STAPLER VISISTAT 35W (STAPLE) IMPLANT
SUCTION FRAZIER TIP 10 FR DISP (SUCTIONS) ×1 IMPLANT
SUT MNCRL AB 3-0 PS2 18 (SUTURE) ×1 IMPLANT
SUT VIC AB 0 CT1 27 (SUTURE) ×4
SUT VIC AB 0 CT1 27XBRD ANBCTR (SUTURE) ×2 IMPLANT
SUT VIC AB 2-0 CT1 27 (SUTURE) ×4
SUT VIC AB 2-0 CT1 TAPERPNT 27 (SUTURE) ×2 IMPLANT
SUT VLOC 180 0 24IN GS25 (SUTURE) ×2 IMPLANT
SYR 50ML LL SCALE MARK (SYRINGE) ×2 IMPLANT
TOWEL OR 17X24 6PK STRL BLUE (TOWEL DISPOSABLE) ×2 IMPLANT
TOWEL OR 17X26 10 PK STRL BLUE (TOWEL DISPOSABLE) ×2 IMPLANT
TRAY FOLEY CATH 14FR (SET/KITS/TRAYS/PACK) IMPLANT
WATER STERILE IRR 1000ML POUR (IV SOLUTION) ×2 IMPLANT

## 2013-08-10 NOTE — Progress Notes (Signed)
Utilization review completed.  

## 2013-08-10 NOTE — Progress Notes (Signed)
Orthopedic Tech Progress Note Patient Details:  Katherine Novak 1934-05-10 [id number]  CPM Right Knee CPM Right Knee: On Right Knee Flexion (Degrees): 60 Right Knee Extension (Degrees): 0 Additional Comments: Trapeze bar and foot roll   Katherine Novak, Katherine Novak 08/10/2013, 3:33 PM

## 2013-08-10 NOTE — Transfer of Care (Signed)
Immediate Anesthesia Transfer of Care Note  Patient: Katherine Novak  Procedure(s) Performed: Procedure(s): TOTAL KNEE ARTHROPLASTY (Right)  Patient Location: PACU  Anesthesia Type:MAC, Spinal and MAC combined with regional for post-op pain  Level of Consciousness: awake, alert , oriented and patient cooperative  Airway & Oxygen Therapy: Patient Spontanous Breathing and Patient connected to nasal cannula oxygen  Post-op Assessment: Report given to PACU RN and Post -op Vital signs reviewed and stable  Post vital signs: Reviewed and stable  Complications: No apparent anesthesia complications

## 2013-08-10 NOTE — Anesthesia Preprocedure Evaluation (Signed)
Anesthesia Evaluation  Patient identified by MRN, date of birth, ID band Patient awake    Reviewed: Allergy & Precautions, H&P , NPO status , Patient's Chart, lab work & pertinent test results, reviewed documented beta blocker date and time   Airway Mallampati: II TM Distance: >3 FB Neck ROM: Full    Dental  (+) Teeth Intact, Dental Advisory Given   Pulmonary  breath sounds clear to auscultation        Cardiovascular hypertension, Rhythm:Regular Rate:Normal     Neuro/Psych    GI/Hepatic   Endo/Other    Renal/GU      Musculoskeletal   Abdominal   Peds  Hematology   Anesthesia Other Findings   Reproductive/Obstetrics                           Anesthesia Physical Anesthesia Plan  ASA: II  Anesthesia Plan: MAC and Spinal   Post-op Pain Management:    Induction:   Airway Management Planned: Natural Airway and Simple Face Mask  Additional Equipment:   Intra-op Plan:   Post-operative Plan:   Informed Consent: I have reviewed the patients History and Physical, chart, labs and discussed the procedure including the risks, benefits and alternatives for the proposed anesthesia with the patient or authorized representative who has indicated his/her understanding and acceptance.   Dental advisory given  Plan Discussed with: CRNA and Anesthesiologist  Anesthesia Plan Comments:         Anesthesia Quick Evaluation

## 2013-08-10 NOTE — Anesthesia Procedure Notes (Addendum)
Procedure Name: MAC Date/Time: 08/10/2013 11:05 AM Performed by: Julian Reil Pre-anesthesia Checklist: Patient identified, Emergency Drugs available, Suction available and Patient being monitored Patient Re-evaluated:Patient Re-evaluated prior to inductionOxygen Delivery Method: Simple face mask Intubation Type: IV induction Placement Confirmation: positive ETCO2    Spinal  Patient location during procedure: OR Staffing Anesthesiologist: Koral Thaden Performed by: anesthesiologist  Preanesthetic Checklist Completed: patient identified, site marked, surgical consent, pre-op evaluation, timeout performed, IV checked, risks and benefits discussed and monitors and equipment checked Spinal Block Patient position: sitting Prep: ChloraPrep Patient monitoring: heart rate, cardiac monitor, continuous pulse ox and blood pressure Approach: right paramedian Location: L4-5 Injection technique: single-shot Needle Needle type: Tuohy  Needle gauge: 22 G Needle length: 9 cm Assessment Sensory level: T10 Additional Notes 10 mg 0.75% marcaine injected easily  Anesthesia Regional Block:  Popliteal block  Pre-Anesthetic Checklist: ,, timeout performed,, Correct Site, Correct Laterality, Correct Procedure, Correct Position, site marked, risks and benefits discussed, Surgical consent,  Pre-op evaluation,  At surgeon's request and post-op pain management  Laterality: Right  Prep: chloraprep       Needles:  Injection technique: Single-shot  Needle Type: Echogenic Stimulator Needle     Needle Length: 9cm 9 cm Needle Gauge: 22 and 22 G    Additional Needles:  Procedures: Doppler guided and ultrasound guided (picture in chart) Popliteal block Narrative:  Start time: 08/10/2013 10:40 AM End time: 08/10/2013 10:45 AM Injection made incrementally with aspirations every 5 mL.  Performed by: Personally   Additional Notes: 20 cc 0.5% Marcaine with 1:200 Epi injected easily

## 2013-08-10 NOTE — Evaluation (Signed)
Physical Therapy Evaluation Patient Details Name: Katherine Novak MRN: [id number] DOB: September 16, 1934 Today's Date: 08/10/2013   History of Present Illness  Patient is a 79 y/o female s/p R TKA. PMH positive for HTN, leukemia, UTIs and increased cholesterol and AAA.  Clinical Impression  Patient presents with post surgical deficits of RLE and pain limiting functional mobility. Pt tolerated SPT to BSC and side stepping along side bed but limited due to pain. No knee buckling noted. Pt would benefit from skilled PT to improve safe mobility and maximize independence so pt can safely return home with follow up HHPT. Per family, pt has 24/7 supervision at home. Will need to review HEP next session with family or interpreter present as pt speaks minimal [place].     Follow Up Recommendations Home health PT;Supervision/Assistance - 24 hour    Equipment Recommendations  Rolling walker with 5" wheels;3in1 (PT)    Recommendations for Other Services       Precautions / Restrictions Precautions Precautions: Knee;Fall Precaution Booklet Issued: Yes (comment) Precaution Comments: HEP handout issed. Restrictions Weight Bearing Restrictions: Yes RLE Weight Bearing: Weight bearing as tolerated Other Position/Activity Restrictions: Pt not English speaking. Family translating.      Mobility  Bed Mobility Overal bed mobility: Needs Assistance Bed Mobility: Supine to Sit;Sit to Supine     Supine to sit: HOB elevated;Min assist Sit to supine: Min assist   General bed mobility comments: Increased time to perform transfer. Min A to help with returning RLE to supine. VC for technique.  Transfers Overall transfer level: Needs assistance Equipment used: Rolling walker (2 wheeled) Transfers: Sit to/from [facility] Sit to Stand: Min assist Stand pivot transfers: Min assist       General transfer comment: Stood x2 from EOB, x1 from [facility]. Min A required for transfers. VC for hand  placement and anterior translation. SPT bed <-> BSC. During transfer, VC to keep walker in proximity as pt with tendency to push walker away.  Ambulation/Gait Ambulation/Gait assistance: Min assist Ambulation Distance (Feet): 4 Feet Assistive device: Rolling walker (2 wheeled) Gait Pattern/deviations: Trunk flexed;Decreased stance time - right     General Gait Details: Side stepped along side bed to right with Min A for walker management and VC for upright posture and hip extension. Increased knee flexion RLE as pt reluctant to place weight on it secondary to pain.  Stairs            Wheelchair Mobility    Modified Rankin (Stroke Patients Only)       Balance Overall balance assessment: Needs assistance Sitting-balance support: Bilateral upper extremity supported Sitting balance-Leahy Scale: Fair Sitting balance - Comments: Posterior lean noted during assessment of LEs, able to return to upright without difficulty.  Postural control: Posterior lean Standing balance support: Bilateral upper extremity supported;During functional activity Standing balance-Leahy Scale: Poor Standing balance comment: Use of RW for support secondary to balance deficits. Min A to stabilize during dynamic activities and transfer.                             Pertinent Vitals/Pain Pt reports pain throughout session, worsened post transfer and ambulation. No rated on pain scale. Pt repositioned in bed with RLE in CPM with ice donned on knee for comfort. Sa02 dropped to 88% on RA during session, no SOB noted. RN present and aware of pt's oxygen saturation. Improved to 91%.    Home Living Family/patient expects to  be discharged to:: Private residence Living Arrangements: Children Available Help at Discharge: Family;Available 24 hours/day Type of Home: House Home Access: Stairs to enter Entrance Stairs-Rails: Psychiatric nurse of Steps: 2 Home Layout: One level Home  Equipment: Cane - quad      Prior Function Level of Independence: Independent with assistive device(s)               Hand Dominance   Dominant Hand: Right    Extremity/Trunk Assessment   Upper Extremity Assessment: Overall WFL for tasks assessed           Lower Extremity Assessment: RLE deficits/detail;LLE deficits/detail RLE Deficits / Details: Impaired AROM hip and knee flexion/extension secondary to post surgical deficits and pain. LLE Deficits / Details: Strength and AROM WFL.     Communication   Communication:  (Pt speaks minimal English. Family translating.)  Cognition Arousal/Alertness: Lethargic Behavior During Therapy: WFL for tasks assessed/performed Overall Cognitive Status: Within Functional Limits for tasks assessed                      General Comments General comments (skin integrity, edema, etc.): Surgical site not visible due to ace wrap.     Exercises Total Joint Exercises Ankle Circles/Pumps: Both;10 reps;Seated      Assessment/Plan    PT Assessment Patient needs continued PT services  PT Diagnosis Difficulty walking;Acute pain   PT Problem List Decreased strength;Cardiopulmonary status limiting activity;Pain;Decreased range of motion;Decreased activity tolerance;Decreased knowledge of use of DME;Decreased balance;Decreased safety awareness;Decreased mobility;Decreased knowledge of precautions;Decreased skin integrity  PT Treatment Interventions DME instruction;Balance training;Gait training;Patient/family education;Therapeutic activities;Functional mobility training;Therapeutic exercise;Stair training   PT Goals (Current goals can be found in the Care Plan section) Acute Rehab PT Goals PT Goal Formulation: Patient unable to participate in goal setting    Frequency 7X/week (BID)   Barriers to discharge        Co-evaluation               End of Session Equipment Utilized During Treatment: Gait belt (02 in nose but not  plugged into wall upon PT arrival.) Activity Tolerance: Patient limited by pain Patient left: in bed;with call bell/phone within reach;with bed alarm set;with family/visitor present;with nursing/sitter in room Nurse Communication: Mobility status;Precautions         Time: 9381-8299 PT Time Calculation (min): 27 min   Charges:   PT Evaluation $Initial PT Evaluation Tier I: 1 Procedure PT Treatments $Therapeutic Activity: 8-22 mins   PT G CodesCandy Sledge A 08/10/2013, 5:24 PM Candy Sledge, [place], DPT [phone]

## 2013-08-10 NOTE — Op Note (Signed)
PREOP DIAGNOSIS: DJD RIGHT KNEE POSTOP DIAGNOSIS: same PROCEDURE: RIGHT TKR ANESTHESIA: Spinal and block ATTENDING SURGEON: Evaan Tidwell G ASSISTANT: Loni Dolly PA  INDICATIONS FOR PROCEDURE: Katherine Novak is a 79 y.o. female who has struggled for a long time with pain due to degenerative arthritis of the right knee.  The patient has failed many conservative non-operative measures and at this point has pain which limits the ability to sleep and walk.  The patient is offered total knee replacement.  Informed operative consent was obtained after discussion of possible risks of anesthesia, infection, neurovascular injury, DVT, and death.  The importance of the post-operative rehabilitation protocol to optimize result was stressed extensively with the patient.  SUMMARY OF FINDINGS AND PROCEDURE:  Katherine Novak was taken to the operative suite where under the above anesthesia a right knee replacement was performed.  There were advanced degenerative changes and the bone quality was fair.  We used the DePuy system and placed size standard femur, 2.5 tibia, 32 mm all polyethylene patella, and a size 12.5 mm spacer.  Loni Dolly PA-C assisted throughout and was invaluable to the completion of the case in that he helped retract and maintain exposure while I placed components.  He also helped close thereby minimizing OR time.  The patient was admitted for appropriate post-op care to include perioperative antibiotics and mechanical and pharmacologic measures for DVT prophylaxis.  DESCRIPTION OF PROCEDURE:  Katherine Novak was taken to the operative suite where the above anesthesia was applied.  The patient was positioned supine and prepped and draped in normal sterile fashion.  An appropriate time out was performed.  After the administration of kefzol pre-op antibiotic the leg was elevated and exsanguinated and a tourniquet inflated. A standard longitudinal incision was made on the anterior knee.  Dissection  was carried down to the extensor mechanism.  All appropriate anti-infective measures were used including the pre-operative antibiotic, betadine impregnated drape, and closed hooded exhaust systems for each member of the surgical team.  A medial parapatellar incision was made in the extensor mechanism and the knee cap flipped and the knee flexed.  Some residual meniscal tissues were removed along with any remaining ACL/PCL tissue.  A guide was placed on the tibia and a flat cut was made on it's superior surface.  An intramedullary guide was placed in the femur and was utilized to make anterior and posterior cuts creating an appropriate flexion gap.  A second intramedullary guide was placed in the femur to make a distal cut properly balancing the knee with an extension gap equal to the flexion gap.  The three bones sized to the above mentioned sizes and the appropriate guides were placed and utilized.  A trial reduction was done and the knee easily came to full extension and the patella tracked well on flexion.  The trial components were removed and all bones were cleaned with pulsatile lavage and then dried thoroughly.  Cement was mixed and was pressurized onto the bones followed by placement of the aforementioned components.  Excess cement was trimmed and pressure was held on the components until the cement had hardened.  The tourniquet was deflated and a small amount of bleeding was controlled with cautery and pressure.  The knee was irrigated thoroughly.  The extensor mechanism was re-approximated with V-loc suture in running fashion.  The knee was flexed and the repair was solid.  The subcutaneous tissues were re-approximated with #0 and #2-0 vicryl and the skin closed with a subcuticular  stitch and steristrips.  A sterile dressing was applied.  Intraoperative fluids, EBL, and tourniquet time can be obtained from anesthesia records.  DISPOSITION:  The patient was taken to recovery room in stable condition and  admitted for appropriate post-op care to include peri-operative antibiotic and DVT prophylaxis with mechanical and pharmacologic measures.  Darragh Nay G 08/10/2013, 12:40 PM

## 2013-08-10 NOTE — Interval H&P Note (Signed)
History and Physical Interval Note:  08/10/2013 10:38 AM  Katherine Novak  has presented today for surgery, with the diagnosis of RIGHT KNEE DEGENERATIVE JOINT DISEASE  The various methods of treatment have been discussed with the patient and family. After consideration of risks, benefits and other options for treatment, the patient has consented to  Procedure(s): TOTAL KNEE ARTHROPLASTY (Right) as a surgical intervention .  The patient's history has been reviewed, patient examined, no change in status, stable for surgery.  I have reviewed the patient's chart and labs.  Questions were answered to the patient's satisfaction.     Kindel Rochefort G

## 2013-08-10 NOTE — Anesthesia Postprocedure Evaluation (Signed)
  Anesthesia Post-op Note  Patient: Katherine Novak  Procedure(s) Performed: Procedure(s): TOTAL KNEE ARTHROPLASTY (Right)  Patient Location: PACU  Anesthesia Type:Spinal  Level of Consciousness: awake, alert  and oriented  Airway and Oxygen Therapy: Patient Spontanous Breathing and Patient connected to nasal cannula oxygen  Post-op Pain: mild  Post-op Assessment: Post-op Vital signs reviewed, Patient's Cardiovascular Status Stable, Respiratory Function Stable, Patent Airway and Pain level controlled  Post-op Vital Signs: Reviewed and stable  Last Vitals:  Filed Vitals:   08/10/13 1450  BP: 150/83  Pulse: 81  Temp: 36.1 C  Resp: 16    Complications: No apparent anesthesia complications

## 2013-08-11 ENCOUNTER — Encounter (HOSPITAL_COMMUNITY): Payer: Self-pay

## 2013-08-11 LAB — BASIC METABOLIC PANEL
ANION GAP: 13 (ref 5–15)
BUN: 15 mg/dL (ref 6–23)
CHLORIDE: 104 meq/L (ref 96–112)
CO2: 23 meq/L (ref 19–32)
CREATININE: 0.54 mg/dL (ref 0.50–1.10)
Calcium: 8.3 mg/dL — ABNORMAL LOW (ref 8.4–10.5)
GFR calc Af Amer: >90 mL/min (ref >90)
GFR calc non Af Amer: 89 mL/min — ABNORMAL LOW (ref >90)
Glucose, Bld: 150 mg/dL — ABNORMAL HIGH (ref 70–99)
Potassium: 4.4 mEq/L (ref 3.7–5.3)
Sodium: 140 mEq/L (ref 137–147)

## 2013-08-11 LAB — CBC
HCT: 27.6 % — ABNORMAL LOW (ref 36.0–46.0)
Hemoglobin: 8.4 g/dL — ABNORMAL LOW (ref 12.0–15.0)
MCH: 25.5 pg — ABNORMAL LOW (ref 26.0–34.0)
MCHC: 30.4 g/dL (ref 30.0–36.0)
MCV: 83.9 fL (ref 78.0–100.0)
Platelets: 179 10*3/uL (ref 150–400)
RBC: 3.29 MIL/uL — AB (ref 3.87–5.11)
RDW: 16.8 % — ABNORMAL HIGH (ref 11.5–15.5)
WBC: 10.3 10*3/uL (ref 4.0–10.5)

## 2013-08-11 MED ORDER — PNEUMOCOCCAL VAC POLYVALENT 25 MCG/0.5ML IJ INJ
0.5000 mL | INJECTION | INTRAMUSCULAR | Status: AC
  Administered 2013-08-13: 0.5 mL via INTRAMUSCULAR
  Filled 2013-08-11: qty 0.5

## 2013-08-11 NOTE — Progress Notes (Signed)
Physical Therapy Treatment Patient Details Name: Katherine Novak MRN: [id number] DOB: 1934-08-25 Today's Date: 08/11/2013    History of Present Illness Patient is a 79 y/o female s/p R TKA. PMH positive for HTN, leukemia, UTIs and increased cholesterol and AAA.    PT Comments    Patient pushing herself with ambulation and highly motivated. Plan to practice steps next session  Follow Up Recommendations        Equipment Recommendations  Rolling walker with 5" wheels;3in1 (PT)    Recommendations for Other Services       Precautions / Restrictions Precautions Precautions: Knee;Fall Restrictions RLE Weight Bearing: Weight bearing as tolerated    Mobility  Bed Mobility   Bed Mobility: Supine to Sit     Supine to sit: Min assist     General bed mobility comments:  Min A for R LE out of bed. VC for technique.  Transfers Overall transfer level: Needs assistance Equipment used: Rolling walker (2 wheeled)   Sit to Stand: Min assist         General transfer comment: Cues for correct hand placement and technique. Patient wanting to pull up from RW. Min A to power up into standing  Ambulation/Gait Ambulation/Gait assistance: Min guard Ambulation Distance (Feet): 200 Feet Assistive device: Rolling walker (2 wheeled) Gait Pattern/deviations: Step-through pattern;Decreased stride length Gait velocity: decreased   General Gait Details: Patient cued for gait sequence and posture.   Stairs            Wheelchair Mobility    Modified Rankin (Stroke Patients Only)       Balance                                    Cognition Arousal/Alertness: Awake/alert Behavior During Therapy: WFL for tasks assessed/performed Overall Cognitive Status: Within Functional Limits for tasks assessed                      Exercises      General Comments        Pertinent Vitals/Pain no apparent distress     Home Living                       Prior Function            PT Goals (current goals can now be found in the care plan section) Progress towards PT goals: Progressing toward goals    Frequency  7X/week    PT Plan Current plan remains appropriate    Co-evaluation             End of Session Equipment Utilized During Treatment: Gait belt Activity Tolerance: Patient tolerated treatment well Patient left: in chair;with call bell/phone within reach;with family/visitor present     Time: 1415-1446 PT Time Calculation (min): 31 min  Charges:  $Gait Training: 8-22 mins $Therapeutic Exercise: 8-22 mins                    G Codes:      Katherine Novak 08/11/2013, 3:16 PM 08/11/2013 Katherine Novak PTA [phone] pager [phone] office

## 2013-08-11 NOTE — Progress Notes (Signed)
Orthopedic Tech Progress Note Patient Details:  Katherine Novak [birth year] [id number] On cpm at 7:35 pm RLE 0-60 Patient ID: [name], female   DOB: 09-09-1934, 79 y.o.   MRN: [id number]   [name] 08/11/2013, 7:38 PM

## 2013-08-11 NOTE — Evaluation (Signed)
Occupational Therapy Evaluation Patient Details Name: Katherine Novak MRN: [id number] DOB: Jan 21, 1934 Today's Date: 08/11/2013    History of Present Illness Patient is a 79 y/o female s/p R TKA. PMH positive for HTN, leukemia, UTIs and increased cholesterol and AAA.   Clinical Impression   Patient evaluated by Occupational Therapy with no further acute OT needs identified. All education has been completed and the patient has no further questions. See below for any follow-up Occupational Therapy or equipment needs. OT to sign off. Thank you for referral.      Follow Up Recommendations  No OT follow up    Equipment Recommendations  Tub/shower seat (SHOWER SEAT)    Recommendations for Other Services       Precautions / Restrictions Precautions Precautions: Knee;Fall Restrictions RLE Weight Bearing: Weight bearing as tolerated      Mobility Bed Mobility Overal bed mobility: Needs Assistance Bed Mobility: Sit to Supine     Supine to sit: Min assist Sit to supine: Mod assist   General bed mobility comments: educated on bed positioning at home to help with correct alignment to decr sliding required  Transfers Overall transfer level: Needs assistance Equipment used: Rolling walker (2 wheeled) Transfers: Sit to/from Stand Sit to Stand: Min assist         General transfer comment: cues for hand placement and to keep RW close when backing up to chair. pt leaving RW and reaching for chair     Balance                                            ADL Overall ADL's : Needs assistance/impaired     Grooming: Wash/dry hands;Min guard;Standing               Lower Body Dressing: Maximal assistance;Sit to/from stand   Toilet Transfer: Moderate assistance;Ambulation;RW;BSC           Functional mobility during ADLs: Minimal assistance;Rolling walker General ADL Comments: Pt has (A) of family and this is second knee surg. pt has adequate help upon dc  and family with no further questions.      Vision                     Perception     Praxis      Pertinent Vitals/Pain Tolerating well premedicated     Hand Dominance Right   Extremity/Trunk Assessment Upper Extremity Assessment Upper Extremity Assessment: Overall WFL for tasks assessed   Lower Extremity Assessment Lower Extremity Assessment: Defer to PT evaluation   Cervical / Trunk Assessment Cervical / Trunk Assessment: Normal   Communication Communication Communication: Prefers language other than English   Cognition Arousal/Alertness: Awake/alert Behavior During Therapy: WFL for tasks assessed/performed Overall Cognitive Status: Within Functional Limits for tasks assessed                     General Comments       Exercises       Shoulder Instructions      Home Living Family/patient expects to be discharged to:: Private residence Living Arrangements: Children Available Help at Discharge: Family;Available 24 hours/day Type of Home: House Home Access: Stairs to enter [employer] of Steps: 2 Entrance Stairs-Rails: Right;Left Home Layout: One level     Bathroom Shower/Tub: Occupational psychologist: Standard     Home  Equipment: Kasandra Knudsen - quad          Prior Functioning/Environment Level of Independence: Independent with assistive device(s)             OT Diagnosis:     OT Problem List:     OT Treatment/Interventions:      OT Goals(Current goals can be found in the care plan section)    OT Frequency:     Barriers to D/C:            Co-evaluation              End of Session CPM Right Knee CPM Right Knee: Off Nurse Communication: Mobility status;Precautions  Activity Tolerance: Patient tolerated treatment well Patient left: in bed;with call bell/phone within reach;with family/visitor present   Time: 1447-1500 OT Time Calculation (min): 13 min Charges:  OT General Charges $OT Visit: 1  Procedure OT Evaluation $Initial OT Evaluation Tier I: 1 Procedure OT Treatments $Self Care/Home Management : 8-22 mins G-Codes:    Peri Maris 16-Aug-2013, 4:19 PM Pager: [phone]

## 2013-08-11 NOTE — Progress Notes (Signed)
Physical Therapy Treatment Patient Details Name: Katherine Novak MRN: [id number] DOB: 18-Nov-1934 Today's Date: 08/11/2013    History of Present Illness      PT Comments    Patient progressing well this morning with ambulation. Patient able to complete HEP with assistance. Will plan on working on long ambulation later this afternoon. Family very helpful and in to translate for patient  Follow Up Recommendations  Home health PT;Supervision/Assistance - 24 hour     Equipment Recommendations  Rolling walker with 5" wheels;3in1 (PT)    Recommendations for Other Services       Precautions / Restrictions Precautions Precautions: Knee;Fall Restrictions Weight Bearing Restrictions: Yes RLE Weight Bearing: Weight bearing as tolerated Other Position/Activity Restrictions: Pt not English speaking. Family translating.    Mobility  Bed Mobility Overal bed mobility: Needs Assistance Bed Mobility: Supine to Sit     Supine to sit: Min assist     General bed mobility comments:  Min A for R LE out of bed. VC for technique.  Transfers Overall transfer level: Needs assistance Equipment used: Rolling walker (2 wheeled)   Sit to Stand: Min assist         General transfer comment: Cues for correct hand placement and technique. Patient wanting to pull up from RW.   Ambulation/Gait Ambulation/Gait assistance: Min guard Ambulation Distance (Feet): 50 Feet Assistive device: Rolling walker (2 wheeled) Gait Pattern/deviations: Step-to pattern;Decreased step length - right;Decreased step length - left Gait velocity: decreased Gait velocity interpretation: <1.8 ft/sec, indicative of risk for recurrent falls General Gait Details: Patient cued for gait sequence and posture. Son followed with chair   Stairs            Wheelchair Mobility    Modified Rankin (Stroke Patients Only)       Balance                                    Cognition Arousal/Alertness:  Awake/alert Behavior During Therapy: WFL for tasks assessed/performed Overall Cognitive Status: Within Functional Limits for tasks assessed                      Exercises Total Joint Exercises Quad Sets: AAROM;Right;10 reps Heel Slides: AAROM;Right;10 reps Hip ABduction/ADduction: AAROM;Right;10 reps Straight Leg Raises: AAROM;Right;10 reps Goniometric ROM: ~40 degrees AAROM in long sitting    General Comments        Pertinent Vitals/Pain no apparent distress     Home Living                      Prior Function            PT Goals (current goals can now be found in the care plan section) Progress towards PT goals: Progressing toward goals    Frequency  7X/week    PT Plan Current plan remains appropriate    Co-evaluation             End of Session Equipment Utilized During Treatment: Gait belt Activity Tolerance: Patient tolerated treatment well Patient left: in chair;with call bell/phone within reach;with family/visitor present     Time: 0941-1010 PT Time Calculation (min): 29 min  Charges:  $Gait Training: 8-22 mins $Therapeutic Exercise: 8-22 mins                    G Codes:      Clovia Reine, Tonia Brooms  08/11/2013, 10:17 AM 08/11/2013 Jacqualyn Posey PTA [phone] pager [phone] office

## 2013-08-11 NOTE — Care Management Note (Signed)
CARE MANAGEMENT NOTE 08/11/2013  Patient:  Katherine Novak, Katherine Novak   Account Number:  [id number]  Date Initiated:  08/11/2013  Documentation initiated by:  Ricki Miller  Subjective/Objective Assessment:   79 yr old female s/p right total knee arthroplasty.     Action/Plan:   Case manager spoke with patient's son's concerning home health and DME needs at discharge. Choice offered. Referral called to Lelan Pons, [facility] liaison.Patient has strong family support at discharge.   Anticipated DC Date:  08/12/2013   Anticipated DC Plan:  [place]  CM consult      [facility] Choice  HOME HEALTH  DURABLE MEDICAL EQUIPMENT   Choice offered to / List presented to:  C-4 Adult Children   DME arranged  [facility]  3-N-1  [facility]      DME agency  [employer]     HH arranged  HH-2 PT      [facility]   Status of service:  Completed, signed off Medicare Important Message given?  NA - LOS <3 / Initial given by admissions (If response is "NO", the following Medicare IM given date fields will be blank) Date Medicare IM given:   Medicare IM given by:   Date Additional Medicare IM given:   Additional Medicare IM given by:    Discharge Disposition:  [place]  Per UR Regulation:  Reviewed for med. necessity/level of care/duration of stay

## 2013-08-11 NOTE — Progress Notes (Addendum)
Subjective: 1 Day Post-Op Procedure(s) (LRB): TOTAL KNEE ARTHROPLASTY (Right)  Activity level:  wbat Diet tolerance:  Eating ok Voiding:  ok Patient reports pain as mild.    Objective: Vital signs in last 24 hours: Temp:  [97 F (36.1 C)-97.8 F (36.6 C)] 97.6 F (36.4 C) (08/05 0530) Pulse Rate:  [71-99] 83 (08/05 0530) Resp:  [11-22] 16 (08/05 0530) BP: (127-179)/(54-84) 146/69 mmHg (08/05 0530) SpO2:  [94 %-100 %] 95 % (08/05 0530) Weight:  [64.864 kg (143 lb)] 64.864 kg (143 lb) (08/04 0843)  Labs:  Recent Labs  08/11/13 0620  HGB 8.4*    Recent Labs  08/11/13 0620  WBC 10.3  RBC 3.29*  HCT 27.6*  PLT 179    Recent Labs  08/11/13 0620  NA 140  K 4.4  CL 104  CO2 23  BUN 15  CREATININE 0.54  GLUCOSE 150*  CALCIUM 8.3*   No results found for this basename: LABPT, INR,  in the last 72 hours  Physical Exam:  Neurologically intact ABD soft Neurovascular intact Sensation intact distally Intact pulses distally Dorsiflexion/Plantar flexion intact Incision: dressing C/D/I No cellulitis present Compartment soft  Assessment/Plan:  1 Day Post-Op Procedure(s) (LRB): TOTAL KNEE ARTHROPLASTY (Right) Advance diet Up with therapy D/C IV fluids Plan for discharge tomorrow Discharge home with home health Dressing change to Mepilex. Continue on ASA 325 mg BID x 2 weeks post op.  Follow up in office 2 weeks post op.  Hgb is at 8.4 patient is currently asymptomatic. We will continue to monitor.     Dionisia Pacholski, Larwance Sachs 08/11/2013, 7:54 AM

## 2013-08-12 ENCOUNTER — Encounter (HOSPITAL_COMMUNITY): Payer: Self-pay | Admitting: Orthopaedic Surgery

## 2013-08-12 DIAGNOSIS — D62 Acute posthemorrhagic anemia: Secondary | ICD-10-CM | POA: Diagnosis not present

## 2013-08-12 LAB — CBC
HEMATOCRIT: 23.2 % — AB (ref 36.0–46.0)
HEMOGLOBIN: 7.2 g/dL — AB (ref 12.0–15.0)
MCH: 25.4 pg — ABNORMAL LOW (ref 26.0–34.0)
MCHC: 31 g/dL (ref 30.0–36.0)
MCV: 82 fL (ref 78.0–100.0)
Platelets: 143 10*3/uL — ABNORMAL LOW (ref 150–400)
RBC: 2.83 MIL/uL — ABNORMAL LOW (ref 3.87–5.11)
RDW: 16.9 % — AB (ref 11.5–15.5)
WBC: 11.7 10*3/uL — AB (ref 4.0–10.5)

## 2013-08-12 NOTE — Progress Notes (Signed)
Subjective: 2 Days Post-Op Procedure(s) (LRB): TOTAL KNEE ARTHROPLASTY (Right)  Activity level:  wbat Diet tolerance:  Eating well Voiding:  ok Patient reports pain as mild.    Objective: Vital signs in last 24 hours: Temp:  [97.6 F (36.4 C)-98 F (36.7 C)] 98 F (36.7 C) (08/06 0530) Pulse Rate:  [76-102] 102 (08/06 0530) Resp:  [16-18] 18 (08/06 0800) BP: (123-126)/(72-74) 126/72 mmHg (08/06 0530) SpO2:  [97 %] 97 % (08/06 0530)  Labs:  Recent Labs  08/11/13 0620 08/12/13 0634  HGB 8.4* 7.2*    Recent Labs  08/11/13 0620 08/12/13 0634  WBC 10.3 11.7*  RBC 3.29* 2.83*  HCT 27.6* 23.2*  PLT 179 143*    Recent Labs  08/11/13 0620  NA 140  K 4.4  CL 104  CO2 23  BUN 15  CREATININE 0.54  GLUCOSE 150*  CALCIUM 8.3*   No results found for this basename: LABPT, INR,  in the last 72 hours  Physical Exam:  Neurologically intact ABD soft Neurovascular intact Sensation intact distally Intact pulses distally Dorsiflexion/Plantar flexion intact Incision: dressing C/D/I No cellulitis present Compartment soft  Assessment/Plan:  2 Days Post-Op Procedure(s) (LRB): TOTAL KNEE ARTHROPLASTY (Right) Advance diet Up with therapy Plan for discharge tomorrow Discharge home with home health We will continue to monitor her Hgb. We spoke with family and the patient and decided to hold on giving blood product right now as she is not currently symptomatic. We will reassess in the morning.  Continue on ASA 325mg  BID x 2 weeks. Follow up in office 2 weeks post op.     Divonte Senger, Larwance Sachs 08/12/2013, 12:10 PM

## 2013-08-12 NOTE — Progress Notes (Signed)
Physical Therapy Treatment Patient Details Name: Katherine Novak MRN: [id number] DOB: May 10, 1934 Today's Date: 08/12/2013    History of Present Illness Patient is a 79 y/o female s/p R TKA. PMH positive for HTN, leukemia, UTIs and increased cholesterol and AAA.    PT Comments    Patient continues to be highly motivated. Able to complete stair training this morning. Planning for DC home later today. Patient has a lot of family supporting her.   Follow Up Recommendations  Home health PT;Supervision/Assistance - 24 hour     Equipment Recommendations  Rolling walker with 5" wheels;3in1 (PT)    Recommendations for Other Services       Precautions / Restrictions Precautions Precautions: Knee;Fall Restrictions Weight Bearing Restrictions: Yes RLE Weight Bearing: Weight bearing as tolerated Other Position/Activity Restrictions: Pt not English speaking. Family translating.    Mobility  Bed Mobility               General bed mobility comments: Patient up in recliner before and after session  Transfers Overall transfer level: Needs assistance Equipment used: Rolling walker (2 wheeled)   Sit to Stand: Min assist         General transfer comment: cues for hand placement and to keep RW close when backing up to chair.  Ambulation/Gait Ambulation/Gait assistance: Min guard Ambulation Distance (Feet): 200 Feet Assistive device: Rolling walker (2 wheeled) Gait Pattern/deviations: Step-through pattern;Decreased stride length Gait velocity: decreased   General Gait Details: Cues to stay close and within Rw   Stairs Stairs: Yes Stairs assistance: Min assist Stair Management: One rail Right;Step to pattern;Sideways Number of Stairs: 3 General stair comments: Cues for sequence and technique. A for safety and support  Wheelchair Mobility    Modified Rankin (Stroke Patients Only)       Balance                                    Cognition  Arousal/Alertness: Awake/alert Behavior During Therapy: WFL for tasks assessed/performed Overall Cognitive Status: Within Functional Limits for tasks assessed                      Exercises Total Joint Exercises Quad Sets: AAROM;Right;10 reps Heel Slides: AAROM;Right;10 reps Hip ABduction/ADduction: AAROM;Right;10 reps Straight Leg Raises: AAROM;Right;10 reps    General Comments        Pertinent Vitals/Pain 5/10 R knee pain. RN provided medication to assist with pain control     Home Living                      Prior Function            PT Goals (current goals can now be found in the care plan section) Progress towards PT goals: Progressing toward goals    Frequency  7X/week    PT Plan Current plan remains appropriate    Co-evaluation             End of Session Equipment Utilized During Treatment: Gait belt Activity Tolerance: Patient tolerated treatment well Patient left: in chair;with call bell/phone within reach;with family/visitor present     Time: 0927-1000 PT Time Calculation (min): 33 min  Charges:  $Gait Training: 8-22 mins $Therapeutic Exercise: 8-22 mins                    G Codes:      Robinette, Gregary Signs  Elizabeth 08/12/2013, 10:37 AM 08/12/2013 Jacqualyn Posey PTA [phone] pager [phone] office

## 2013-08-13 DIAGNOSIS — M1710 Unilateral primary osteoarthritis, unspecified knee: Secondary | ICD-10-CM | POA: Diagnosis not present

## 2013-08-13 LAB — CBC
HCT: 22.7 % — ABNORMAL LOW (ref 36.0–46.0)
HEMOGLOBIN: 7 g/dL — AB (ref 12.0–15.0)
MCH: 25.7 pg — ABNORMAL LOW (ref 26.0–34.0)
MCHC: 30.8 g/dL (ref 30.0–36.0)
MCV: 83.5 fL (ref 78.0–100.0)
Platelets: 141 10*3/uL — ABNORMAL LOW (ref 150–400)
RBC: 2.72 MIL/uL — AB (ref 3.87–5.11)
RDW: 17 % — ABNORMAL HIGH (ref 11.5–15.5)
WBC: 8.9 10*3/uL (ref 4.0–10.5)

## 2013-08-13 MED ORDER — METHOCARBAMOL 500 MG PO TABS: 500.0000 mg | ORAL_TABLET | Freq: Four times a day (QID) | ORAL | Status: DC | PRN

## 2013-08-13 MED ORDER — HYDROCODONE-ACETAMINOPHEN 10-325 MG PO TABS: 1.0000 | ORAL_TABLET | Freq: Four times a day (QID) | ORAL | Status: DC | PRN

## 2013-08-13 MED ORDER — ASPIRIN 325 MG PO TBEC: 325.0000 mg | DELAYED_RELEASE_TABLET | Freq: Two times a day (BID) | ORAL | Status: DC

## 2013-08-13 NOTE — Progress Notes (Signed)
Subjective: 3 Days Post-Op Procedure(s) (LRB): TOTAL KNEE ARTHROPLASTY (Right)  Activity level:  wbat Diet tolerance:  Eating well Voiding:  ok Patient reports pain as mild.    Objective: Vital signs in last 24 hours: Temp:  [98.4 F (36.9 C)-98.5 F (36.9 C)] 98.5 F (36.9 C) (08/07 0556) Pulse Rate:  [93-98] 95 (08/07 0556) Resp:  [18] 18 (08/07 0556) BP: (112-122)/(54-58) 115/56 mmHg (08/07 0556) SpO2:  [98 %-99 %] 99 % (08/07 0556)  Labs:  Recent Labs  08/11/13 0620 08/12/13 0634 08/13/13 0618  HGB 8.4* 7.2* 7.0*    Recent Labs  08/12/13 0634 08/13/13 0618  WBC 11.7* 8.9  RBC 2.83* 2.72*  HCT 23.2* 22.7*  PLT 143* 141*    Recent Labs  08/11/13 0620  NA 140  K 4.4  CL 104  CO2 23  BUN 15  CREATININE 0.54  GLUCOSE 150*  CALCIUM 8.3*   No results found for this basename: LABPT, INR,  in the last 72 hours  Physical Exam:  Neurologically intact ABD soft Neurovascular intact Sensation intact distally Intact pulses distally Dorsiflexion/Plantar flexion intact Incision: dressing C/D/I No cellulitis present Compartment soft  Assessment/Plan:  3 Days Post-Op Procedure(s) (LRB): TOTAL KNEE ARTHROPLASTY (Right) Advance diet Up with therapy Discharge home with home health and home PT Continue on ASA 325mg  BID x 2 weeks post op. Follow up in office 2 weeks post op.    Katherine Novak, Katherine Novak 08/13/2013, 7:42 AM

## 2013-08-13 NOTE — Progress Notes (Signed)
Patient discharged to home accompanied by family. Discharge instructions and rx given and explained and patient/family stated understanding. IV was removed and surgical dressing was changed. Patient left unit in a stable condition with all personal belongings via wheelchair.

## 2013-08-13 NOTE — Discharge Summary (Signed)
Patient ID: Katherine Novak MRN: [id number] DOB/AGE: [birth year] 79 y.o.  Admit date: 08/10/2013 Discharge date: 08/13/2013  Admission Diagnoses:  Principal Problem:   Right knee DJD Active Problems:   Postoperative anemia due to acute blood loss   Discharge Diagnoses:  Same  Past Medical History  Diagnosis Date  . Hypertension   . Hypercholesterolemia   . GERD (gastroesophageal reflux disease)   . Barrett's esophagus   . Vitamin D deficiency   . Osteoporosis   . Arthritis   . AAA (abdominal aortic aneurysm)   . Leukemia     CLL observation  dr Inda Merlin cone  . History of recurrent UTIs   . Blood dyscrasia     leukemia cll    Surgeries: Procedure(s): TOTAL KNEE ARTHROPLASTY on 08/10/2013   Consultants:    Discharged Condition: Improved  Hospital Course: Katherine Novak is an 79 y.o. female who was admitted 08/10/2013 for operative treatment ofRight knee DJD. Patient has severe unremitting pain that affects sleep, daily activities, and work/hobbies. After pre-op clearance the patient was taken to the operating room on 08/10/2013 and underwent  Procedure(s): TOTAL KNEE ARTHROPLASTY.    Patient was given perioperative antibiotics: Anti-infectives   Start     Dose/Rate Route Frequency Ordered Stop   08/10/13 1530  ceFAZolin (ANCEF) IVPB 2 g/50 mL premix     2 g 100 mL/hr over 30 Minutes Intravenous Every 6 hours 08/10/13 1507 08/10/13 2227   08/10/13 0600  ceFAZolin (ANCEF) IVPB 2 g/50 mL premix     2 g 100 mL/hr over 30 Minutes Intravenous On call to O.R. 08/09/13 1402 08/10/13 1119       Patient was given sequential compression devices, early ambulation, and chemoprophylaxis to prevent DVT.  Patient benefited maximally from hospital stay and there were no complications.   We continued to monitor Hgb post op and patient remained asymptomatic and was doing well so we decided no blood product was necessary.   Recent vital signs: Patient Vitals for the past 24 hrs:  BP  Temp Temp src Pulse Resp SpO2  08/13/13 0556 115/56 mmHg 98.5 F (36.9 C) Oral 95 18 99 %  08/13/13 0000 - - - - 18 99 %  08/12/13 2004 122/58 mmHg 98.4 F (36.9 C) Oral 98 18 99 %  08/12/13 1600 - - - - 18 -  08/12/13 1522 112/54 mmHg 98.5 F (36.9 C) Oral 93 - 98 %  08/12/13 1200 - - - - 18 -  08/12/13 0800 - - - - 18 -     Recent laboratory studies:  Recent Labs  08/11/13 0620 08/12/13 0634 08/13/13 0618  WBC 10.3 11.7* 8.9  HGB 8.4* 7.2* 7.0*  HCT 27.6* 23.2* 22.7*  PLT 179 143* 141*  NA 140  --   --   K 4.4  --   --   CL 104  --   --   CO2 23  --   --   BUN 15  --   --   CREATININE 0.54  --   --   GLUCOSE 150*  --   --   CALCIUM 8.3*  --   --      Discharge Medications:     Medication List    STOP taking these medications       diclofenac sodium 1 % Gel  Commonly known as:  VOLTAREN     meloxicam 7.5 MG tablet  Commonly known as:  MOBIC  TAKE these medications       acetaminophen 325 MG tablet  Commonly known as:  TYLENOL  Take 650 mg by mouth every 6 (six) hours as needed.     alendronate 70 MG tablet  Commonly known as:  FOSAMAX  - Take 70 mg by mouth every 7 (seven) days. Take with a full glass of water on an empty stomach.   - On Wednesday     aspirin 325 MG EC tablet  Take 1 tablet (325 mg total) by mouth 2 (two) times daily after a meal.     furosemide 20 MG tablet  Commonly known as:  LASIX  Take 20 mg by mouth daily as needed.     HYDROcodone-acetaminophen 10-325 MG per tablet  Commonly known as:  NORCO  Take 1-2 tablets by mouth every 6 (six) hours as needed for moderate pain or severe pain.     methocarbamol 500 MG tablet  Commonly known as:  ROBAXIN  Take 1 tablet (500 mg total) by mouth every 6 (six) hours as needed for muscle spasms.     metoprolol tartrate 25 MG tablet  Commonly known as:  LOPRESSOR  Take 25 mg by mouth 2 (two) times daily.     omeprazole 20 MG capsule  Commonly known as:  PRILOSEC  Take 20 mg by  mouth daily.     rosuvastatin 5 MG tablet  Commonly known as:  CRESTOR  Take 5 mg by mouth daily.        Diagnostic Studies: Dg Chest 2 View  07/28/2013   CLINICAL DATA:  History of abdominal aortic aneurysm, hypertension, and leukemia, for preoperative evaluation prior to knee replacement surgery  EXAM: CHEST  2 VIEW  COMPARISON:  06/23/2012  FINDINGS: The heart size and vascular pattern are normal. There is uncoiling of the aorta which is stable. Lungs are clear. No pleural effusion. Bony thorax is intact.  IMPRESSION: No active cardiopulmonary disease.   Electronically Signed   By: Skipper Cliche M.D.   On: 07/28/2013 12:40    Disposition: 06-Home-Health Care Svc      Discharge Instructions   Call MD / Call 911    Complete by:  As directed   If you experience chest pain or shortness of breath, CALL 911 and be transported to the hospital emergency room.  If you develope a fever above 101 F, pus (white drainage) or increased drainage or redness at the wound, or calf pain, call your surgeon's office.     Constipation Prevention    Complete by:  As directed   Drink plenty of fluids.  Prune juice may be helpful.  You may use a stool softener, such as Colace (over the counter) 100 mg twice a day.  Use MiraLax (over the counter) for constipation as needed.     Diet - low sodium heart healthy    Complete by:  As directed      Increase activity slowly as tolerated    Complete by:  As directed            Follow-up Information   Follow up with Hessie Dibble, MD. Call in 2 weeks.   Specialty:  Orthopedic Surgery   Contact information:   [place] [address] [phone]       Follow up with [facility]. (Someone from [facility] will contact you concerning start date and time for physical therapy.)    Contact information:   [address]  [place] [address] [phone]        Signed: Rich Fuchs 08/13/2013, 7:45  AM

## 2013-08-13 NOTE — Progress Notes (Signed)
Physical Therapy Treatment Patient Details Name: Katherine Novak MRN: [id number] DOB: [birth year] Today's Date: 08/13/2013    History of Present Illness Patient is a 79 y/o female s/p R TKA. PMH positive for HTN, leukemia, UTIs and increased cholesterol and AAA.    PT Comments    Patient stayed overnight due to low Hgb. Is stable now per PA and patient planning to DC later this afternoon  Follow Up Recommendations  Home health PT;Supervision/Assistance - 24 hour     Equipment Recommendations  Rolling walker with 5" wheels;3in1 (PT)    Recommendations for Other Services       Precautions / Restrictions Precautions Precautions: Knee;Fall Restrictions Weight Bearing Restrictions: Yes RLE Weight Bearing: Weight bearing as tolerated Other Position/Activity Restrictions: Pt not English speaking. Family translating.    Mobility  Bed Mobility Overal bed mobility: Needs Assistance             General bed mobility comments: Min A for R LE  Transfers Overall transfer level: Needs assistance Equipment used: Rolling walker (2 wheeled)   Sit to Stand: Min guard         General transfer comment: Cues to back all the way back to surface prior to sitting  Ambulation/Gait Ambulation/Gait assistance: Supervision Ambulation Distance (Feet): 250 Feet Assistive device: Rolling walker (2 wheeled) Gait Pattern/deviations: Step-through pattern;Decreased stride length     General Gait Details: Cues to stay close and within Rw   Stairs   Stairs assistance: Min guard Stair Management: Step to pattern;Sideways Number of Stairs: 3 General stair comments: Cues for sequence and technique.  Wheelchair Mobility    Modified Rankin (Stroke Patients Only)       Balance                                    Cognition Arousal/Alertness: Awake/alert Behavior During Therapy: WFL for tasks assessed/performed Overall Cognitive Status: Within Functional Limits for  tasks assessed                      Exercises Total Joint Exercises Quad Sets: AAROM;Right;10 reps Heel Slides: AAROM;Right;10 reps Hip ABduction/ADduction: AAROM;Right;10 reps Straight Leg Raises: AAROM;Right;10 reps    General Comments        Pertinent Vitals/Pain Pain Assessment: 0-10 Pain Score: 5  Pain Location: R knee Pain Descriptors / Indicators: Burning Pain Intervention(s): Repositioned    Home Living                      Prior Function            PT Goals (current goals can now be found in the care plan section) Progress towards PT goals: Progressing toward goals    Frequency  7X/week    PT Plan Current plan remains appropriate    Co-evaluation             End of Session Equipment Utilized During Treatment: Gait belt Activity Tolerance: Patient tolerated treatment well Patient left: in chair;with call bell/phone within reach;with family/visitor present     Time: 2202-5427 PT Time Calculation (min): 30 min  Charges:  $Gait Training: 8-22 mins $Therapeutic Exercise: 8-22 mins                    G Codes:      Katherine Novak 08/13/2013, 9:54 AM  08/13/2013 Katherine Novak PTA [phone] pager [phone]  office

## 2013-08-13 NOTE — Care Management Note (Signed)
Ricki Miller, RN BSN Case Manager  08/13/13 12:38pm Medicare IM letter given to patient's son by this Probation officer.

## 2013-08-23 ENCOUNTER — Other Ambulatory Visit (HOSPITAL_COMMUNITY): Payer: Self-pay | Admitting: Orthopaedic Surgery

## 2013-08-23 ENCOUNTER — Ambulatory Visit (HOSPITAL_COMMUNITY)
Admission: RE | Admit: 2013-08-23 | Discharge: 2013-08-23 | Disposition: A | Discharge status: Home / Self Care | Payer: PRIVATE HEALTH INSURANCE | Source: Ambulatory Visit | Attending: Orthopaedic Surgery | Admitting: Orthopaedic Surgery

## 2013-08-23 DIAGNOSIS — M7989 Other specified soft tissue disorders: Secondary | ICD-10-CM | POA: Insufficient documentation

## 2013-08-23 DIAGNOSIS — M79609 Pain in unspecified limb: Secondary | ICD-10-CM | POA: Insufficient documentation

## 2013-08-23 DIAGNOSIS — M79604 Pain in right leg: Secondary | ICD-10-CM

## 2013-08-23 NOTE — Progress Notes (Signed)
*  PRELIMINARY RESULTS* Vascular Ultrasound Right lower extremity venous duplex has been completed.  Preliminary findings: In the distal medial calf, there is a hypoechoic lesion that is adjacent to posterior tibial veins. Etiology of this lesion unknown. Possibly hematoma vs complex cyst. No evidence of DVT noted.  Attempted to call report to Dr. Rhona Raider. There was no answer at [phone] and no voice mail. Let patient leave.   Landry Mellow, RDMS, RVT  08/23/2013, 10:04 AM

## 2013-12-20 ENCOUNTER — Ambulatory Visit: Payer: Medicare Other

## 2014-04-08 ENCOUNTER — Telehealth: Payer: Self-pay | Admitting: Neurology

## 2014-04-08 ENCOUNTER — Encounter: Payer: Self-pay | Admitting: Neurology

## 2014-04-08 ENCOUNTER — Ambulatory Visit (INDEPENDENT_AMBULATORY_CARE_PROVIDER_SITE_OTHER): Payer: Medicare Other | Admitting: Neurology

## 2014-04-08 VITALS — BP 138/78 | HR 76 | Ht 62.0 in | Wt 146.0 lb

## 2014-04-08 DIAGNOSIS — G20 Parkinson's disease: Secondary | ICD-10-CM | POA: Diagnosis not present

## 2014-04-08 MED ORDER — CARBIDOPA-LEVODOPA 25-100 MG PO TABS: 1.0000 | ORAL_TABLET | Freq: Three times a day (TID) | ORAL | Status: DC

## 2014-04-08 NOTE — Patient Instructions (Addendum)
1. Start Carbidopa Levodopa as follows: 1/2 tab three times a day before meals x 1 wk, then 1/2 in am & noon & 1 in evening for a week, then 1/2 in am &1 at noon &one in evening for a week, then 1 tablet three times a day before meals. 2. We have referred you to [facility] for physical therapy. They will contact you directly to set up care. They can be reached at [phone].

## 2014-04-08 NOTE — Progress Notes (Addendum)
Katherine Novak was seen today in the movement disorders clinic for neurologic consultation at the request of [name], MD.  The consultation is for the evaluation of tremor.  Pt is accompanied by her son who supplements the history.  A medical translator is also present.  Pt states that it started 6 months ago and only in the right hand and no where else.  It is at rest and not with use.  It has increased over 6 months.  No family history of tremor   Specific Symptoms:  Tremor: Yes.  , as above Voice: somewhat more soft per son Sleep: sleeps well  Vivid Dreams:  No.  Acting out dreams:  No. Wet Pillows: No. Postural symptoms:  Yes.    Falls?  Yes.  , one fall a month ago in the bathroom and hit head but no LOC, needed help to get up Bradykinesia symptoms: slow movements, difficulty getting out of a chair and leans forward Loss of smell:  No. Loss of taste:  No.  Urinary Incontinence:  Yes.   for years Difficulty Swallowing:  No. Handwriting, micrographia: Yes.  , perhaps some but not a lot but also doesn't write much anymore Trouble with ADL's:  No. (sits down to put on pants)  Trouble buttoning clothing: Yes.   (slower than in the past) Depression:  No. (nothing significant) Memory changes:  Yes.   ("slowed down some"; lives with son; does not drive) Hallucinations:  No.  visual distortions: No. N/V:  Yes.   (some nausea, no emesis) Lightheaded:  Yes.   (occasionally)  Syncope: No. Diplopia:  No. Dyskinesia:  No.  CT of the brain was performed in 2011.  I reviewed these films.  This was nonacute with the exception of small vessel disease.  PREVIOUS MEDICATIONS: none to date  ALLERGIES:   Allergies  Allergen Reactions  . Sulfa Antibiotics     Angioedema of lips , throat    CURRENT MEDICATIONS:  Outpatient Encounter Prescriptions as of 04/08/2014  Medication Sig  . acetaminophen (TYLENOL) 325 MG tablet Take 650 mg by mouth every 6 (six) hours as needed.  Marland Kitchen  alendronate (FOSAMAX) 70 MG tablet Take 70 mg by mouth every 7 (seven) days. Take with a full glass of water on an empty stomach.  On Wednesday  . diclofenac sodium (VOLTAREN) 1 % GEL Apply topically 4 (four) times daily.  . furosemide (LASIX) 20 MG tablet Take 20 mg by mouth daily as needed.  Marland Kitchen HYDROcodone-acetaminophen (NORCO) 10-325 MG per tablet Take 1-2 tablets by mouth every 6 (six) hours as needed for moderate pain or severe pain.  . methocarbamol (ROBAXIN) 500 MG tablet Take 1 tablet (500 mg total) by mouth every 6 (six) hours as needed for muscle spasms.  . metoprolol tartrate (LOPRESSOR) 25 MG tablet Take 25 mg by mouth 2 (two) times daily.  Marland Kitchen omeprazole (PRILOSEC) 20 MG capsule Take 20 mg by mouth daily.  . rosuvastatin (CRESTOR) 5 MG tablet Take 5 mg by mouth daily.  Marland Kitchen aspirin EC 325 MG EC tablet Take 1 tablet (325 mg total) by mouth 2 (two) times daily after a meal.    PAST MEDICAL HISTORY:   Past Medical History  Diagnosis Date  . Hypertension   . Hypercholesterolemia   . GERD (gastroesophageal reflux disease)   . Barrett's esophagus   . Vitamin D deficiency   . Osteoporosis   . Arthritis   . AAA (abdominal aortic aneurysm)   .  Leukemia     CLL observation  dr Inda Merlin cone  . History of recurrent UTIs   . Blood dyscrasia     leukemia cll    PAST SURGICAL HISTORY:   Past Surgical History  Procedure Laterality Date  . Carpel tunnel-left    . Cataract extraction  bilateral  . Total knee arthroplasty Left 06/23/2012    Procedure: TOTAL KNEE ARTHROPLASTY;  Surgeon: Hessie Dibble, MD;  Location: [facility];  Service: Orthopedics;  Laterality: Left;  DEPUY, RNFA  . Total knee arthroplasty Right 08/10/2013    Procedure: TOTAL KNEE ARTHROPLASTY;  Surgeon: Hessie Dibble, MD;  Location: [facility];  Service: Orthopedics;  Laterality: Right;    SOCIAL HISTORY:   History   Social History  . Marital Status: Widowed    Spouse Name: N/A  . Number of Children: N/A  . Years of  Education: N/A   Occupational History  . Not on file.   Social History Main Topics  . Smoking status: Never Smoker   . Smokeless tobacco: Never Used  . Alcohol Use: No  . Drug Use: No  . Sexual Activity: Not on file   Other Topics Concern  . Not on file   Social History Narrative    FAMILY HISTORY:   Family Status  Relation Status Death Age  . Mother Deceased     cancer (unknown kind)  . Father Deceased     asthma  . Sister Deceased     asthma  . Brother Deceased     throat cancer  . Brother Deceased     asthma  . Son Alive     healthy  . Son Alive     healthy  . Daughter Alive     rheumatoid arthritis, thyroid disease  . Daughter Alive     healthy    ROS:  A complete 10 system review of systems was obtained and was unremarkable apart from what is mentioned above.  PHYSICAL EXAMINATION:    VITALS:   Filed Vitals:   04/08/14 0844  BP: 138/78  Pulse: 76  Height: 5\' 2"  (1.575 m)  Weight: 146 lb (66.225 kg)    GEN:  The patient appears stated age and is in NAD. HEENT:  Normocephalic, atraumatic.  The mucous membranes are moist. The superficial temporal arteries are without ropiness or tenderness. CV:  RRR Lungs:  CTAB Neck/HEME:  There are no carotid bruits bilaterally.  Neurological examination:  Orientation: difficult to assess via translator as both son and medical translator are helping to translate.  Is very alert and responsive to commands.  Cannot name months backwards so son asks her to count backward instead.   Cranial nerves: There is good facial symmetry with the exception of the fact that the right palpebral fissure is smaller than the left. Pupils are equal round and reactive to light bilaterally. Fundoscopic exam is attempted but the disc margins are not well visualized bilaterally. Extraocular muscles are intact. The visual fields are full to confrontational testing. The speech appears fluent and clear, but she does not speak my language. Soft  palate rises symmetrically and there is no tongue deviation. Hearing is intact to conversational tone. Sensation: Sensation is intact to light and pinprick throughout (facial, trunk, extremities). Vibration is decreased at the bilateral big toe. There is no extinction with double simultaneous stimulation. There is no sensory dermatomal level identified. Motor: Strength is 5/5 in the bilateral upper and lower extremities.   Shoulder shrug is equal  and symmetric.  There is no pronator drift. Deep tendon reflexes: Deep tendon reflexes are 1/4 at the bilateral biceps, triceps, brachioradialis, patella and absent at the bilateral achilles. Plantar responses are downgoing bilaterally.  Movement examination: Tone: There is mild increased tone in the right upper and right lower extremities.  Tone in the left upper and left lower extremity is normal.  Abnormal movements: There is a right upper extremity resting tremor that increases with distraction procedures.  A very rare left upper extremity resting tremor is noted. Coordination:  There is significant decremation with RAM's, seen with finger taps on the right, hand opening and closing on the right, and alternation of supination/pronation on the right.  Heel taps and toe taps on the right are fairly good.  Rapid alternating movements on the left are good. Gait and Station: The patient has some difficulty arising out of a deep-seated chair without the use of the hands.  She is able to do it on the first attempt when she positions her "nose over the toes."  The patient's stride length is decreased with an increase in the right upper extremity resting tremor and a stooped posture.    Labs:  I reviewed labs from 03/21/2014 from her primary care physician.  Her TSH was 2.50.  Her B12 was slightly low at 274.  RPR was negative.  Her AST was 15, ALT 9, alkaline phosphatase 83.    ASSESSMENT/PLAN:  1.   Idiopathic Parkinson's disease, newly diagnosed on 04/08/2014.   The patient has tremor, bradykinesia, rigidity and postural instability.  -We discussed the diagnosis as well as pathophysiology of the disease.  We discussed treatment options as well as prognostic indicators.  Pts son did not want diagnosis disclosed to patient and asked medical interpreter not to translate so patient not fully aware of this discussion, despite my expressed reservation about this.  -Greater than 50% of the 60 minute visit was spent in counseling answering family questions and talking about what to expect now as well as in the future.  We talked about medication options as well as potential future surgical options.  We talked about safety in the home.  -We decided to add carbidopa/levodopa 25/100.  1/2 tab tid x 1 wk, then 1/2 in am & noon & 1 at night for a week, then 1/2 in am &1 at noon &night for a week, then 1 po tid.  Risks, benefits, side effects and alternative therapies were discussed.  The opportunity to ask questions was given and they were answered to the best of my ability.  The patients family (son) expressed understanding and willingness to follow the outlined treatment protocols.  -We will send physical therapy to the home.  Discussed the importance of safe, cardiovascular exercise. 2.  Return in about 3 months (around 07/08/2014).

## 2014-04-08 NOTE — Telephone Encounter (Signed)
Referral to [facility] PT faxed to [phone] with confirmation received. They will contact patient.  

## 2014-04-11 ENCOUNTER — Telehealth: Payer: Self-pay | Admitting: Neurology

## 2014-04-11 NOTE — Telephone Encounter (Signed)
Spoke with son and order faxed to correct number [phone] with confirmation received.

## 2014-04-11 NOTE — Telephone Encounter (Addendum)
PT signed by Dr Tat and faxed to [facility] at [phone] - fax failed multiple times. LMOM for son to call back to verify fax number or receive different number to send order to.

## 2014-04-11 NOTE — Telephone Encounter (Signed)
Clarified order for home health PT.

## 2014-04-11 NOTE — Telephone Encounter (Signed)
Please call tara at [facility] health at [phone]. She needs to know if the referral was for outside help or what

## 2014-04-11 NOTE — Telephone Encounter (Signed)
LMOM for Katherine Novak to call back.

## 2014-04-11 NOTE — Telephone Encounter (Signed)
Pt son Nimish called and wants the referral for the home health care to faxed to [facility] at [phone]  Pt son phone number is [phone]

## 2014-04-12 ENCOUNTER — Telehealth: Payer: Self-pay | Admitting: Neurology

## 2014-04-12 NOTE — Telephone Encounter (Signed)
Tiffany w/ [facility] is requesting a return call at [phone] / Sherri S.

## 2014-04-12 NOTE — Telephone Encounter (Signed)
Spoke with Katherine Novak and she states insurance out of network for [employer]. Patient's son had decided to use another home health company anyway. Will call as needed.

## 2014-05-17 ENCOUNTER — Telehealth: Payer: Self-pay | Admitting: Neurology

## 2014-05-17 NOTE — Telephone Encounter (Signed)
Order was faxed today with confirmation received.

## 2014-05-17 NOTE — Telephone Encounter (Signed)
Physical therapy called, they faxed order yesterday to hold PT x 1 week b/c pt is on vacation. Wanting to confirm order was recv'd. Please call back [phone] / Sherri S.

## 2014-07-01 ENCOUNTER — Encounter: Payer: Self-pay | Admitting: Neurology

## 2014-07-01 ENCOUNTER — Ambulatory Visit (INDEPENDENT_AMBULATORY_CARE_PROVIDER_SITE_OTHER): Payer: Medicare Other | Admitting: Neurology

## 2014-07-01 VITALS — BP 128/68 | HR 82 | Wt 144.8 lb

## 2014-07-01 DIAGNOSIS — G20 Parkinson's disease: Secondary | ICD-10-CM

## 2014-07-01 MED ORDER — CARBIDOPA-LEVODOPA 25-100 MG PO TABS: 1.0000 | ORAL_TABLET | Freq: Three times a day (TID) | ORAL | Status: DC

## 2014-07-01 MED ORDER — CARBIDOPA-LEVODOPA ER 50-200 MG PO TBCR: 1.0000 | EXTENDED_RELEASE_TABLET | Freq: Every day | ORAL | Status: DC

## 2014-07-01 NOTE — Progress Notes (Signed)
Katherine Novak was seen today in the movement disorders clinic for neurologic consultation at the request of [name], MD.  The consultation is for the evaluation of tremor.  Pt is accompanied by her son who supplements the history.  A medical translator is also present.  Pt states that it started 6 months ago and only in the right hand and no where else.  It is at rest and not with use.  It has increased over 6 months.  No family history of tremor  07/01/14 update:  Pt is f/u today, accompanied by her son and medical translator.  Pt is now on carbidopa/levodopa 25/100 tid.  She has taking it right at the mealtime.  She is doing better in terms of tremor.  It will awaken her up from sleep, however.  She went to PT at [facility] and did well.  No falls.  No lightheadedness, except minor maybe one time a month.  Denies hallucinations.  She is having vivid dreams but not acting them out.  She is not choking on her food.  She denies any side effects with the levodopa.  Her son notices that her facial expression is much improved.  CT of the brain was performed in 2011.  I reviewed these films.  This was nonacute with the exception of small vessel disease.  PREVIOUS MEDICATIONS: none to date  ALLERGIES:   Allergies  Allergen Reactions  . Sulfa Antibiotics     Angioedema of lips , throat    CURRENT MEDICATIONS:  Outpatient Encounter Prescriptions as of 07/01/2014  Medication Sig  . acetaminophen (TYLENOL) 325 MG tablet Take 650 mg by mouth every 6 (six) hours as needed.  Marland Kitchen alendronate (FOSAMAX) 70 MG tablet Take 70 mg by mouth every 7 (seven) days. Take with a full glass of water on an empty stomach.  On Wednesday  . aspirin EC 325 MG EC tablet Take 1 tablet (325 mg total) by mouth 2 (two) times daily after a meal.  . carbidopa-levodopa (SINEMET IR) 25-100 MG per tablet Take 1 tablet by mouth 3 (three) times daily.  . diclofenac sodium (VOLTAREN) 1 % GEL Apply topically 4 (four) times  daily.  . furosemide (LASIX) 20 MG tablet Take 20 mg by mouth daily as needed.  Marland Kitchen HYDROcodone-acetaminophen (NORCO) 10-325 MG per tablet Take 1-2 tablets by mouth every 6 (six) hours as needed for moderate pain or severe pain.  . methocarbamol (ROBAXIN) 500 MG tablet Take 1 tablet (500 mg total) by mouth every 6 (six) hours as needed for muscle spasms.  . metoprolol tartrate (LOPRESSOR) 25 MG tablet Take 25 mg by mouth 2 (two) times daily.  Marland Kitchen omeprazole (PRILOSEC) 20 MG capsule Take 20 mg by mouth daily.  . rosuvastatin (CRESTOR) 5 MG tablet Take 5 mg by mouth daily.   No facility-administered encounter medications on file as of 07/01/2014.    PAST MEDICAL HISTORY:   Past Medical History  Diagnosis Date  . Hypertension   . Hypercholesterolemia   . GERD (gastroesophageal reflux disease)   . Barrett's esophagus   . Vitamin D deficiency   . Osteoporosis   . Arthritis   . AAA (abdominal aortic aneurysm)   . Leukemia     CLL observation  dr Inda Merlin cone  . History of recurrent UTIs   . Blood dyscrasia     leukemia cll    PAST SURGICAL HISTORY:   Past Surgical History  Procedure Laterality Date  . Carpel tunnel-left    .  Cataract extraction  bilateral  . Total knee arthroplasty Left 06/23/2012    Procedure: TOTAL KNEE ARTHROPLASTY;  Surgeon: Hessie Dibble, MD;  Location: [facility];  Service: Orthopedics;  Laterality: Left;  DEPUY, RNFA  . Total knee arthroplasty Right 08/10/2013    Procedure: TOTAL KNEE ARTHROPLASTY;  Surgeon: Hessie Dibble, MD;  Location: [place];  Service: Orthopedics;  Laterality: Right;    SOCIAL HISTORY:   History   Social History  . Marital Status: Widowed    Spouse Name: N/A  . Number of Children: N/A  . Years of Education: N/A   Occupational History  . Not on file.   Social History Main Topics  . Smoking status: Never Smoker   . Smokeless tobacco: Never Used  . Alcohol Use: No  . Drug Use: No  . Sexual Activity: Not on file   Other Topics  Concern  . Not on file   Social History Narrative    FAMILY HISTORY:   Family Status  Relation Status Death Age  . Mother Deceased     cancer (unknown kind)  . Father Deceased     asthma  . Sister Deceased     asthma  . Brother Deceased     throat cancer  . Brother Deceased     asthma  . Son Alive     healthy  . Son Alive     healthy  . Daughter Alive     rheumatoid arthritis, thyroid disease  . Daughter Alive     healthy    ROS:  A complete 10 system review of systems was obtained and was unremarkable apart from what is mentioned above.  PHYSICAL EXAMINATION:    VITALS:   Filed Vitals:   07/01/14 1106  BP: 128/68  Pulse: 82  Weight: 144 lb 12.8 oz (65.681 kg)  SpO2: 94%    GEN:  The patient appears stated age and is in NAD. HEENT:  Normocephalic, atraumatic.  The mucous membranes are moist. The superficial temporal arteries are without ropiness or tenderness. CV:  RRR Lungs:  CTAB Neck/HEME:  There are no carotid bruits bilaterally.  Neurological examination:  Orientation: difficult to assess via translator as both son and medical translator are helping to translate.  Is very alert and responsive to commands.   Cranial nerves: There is good facial symmetry with the exception of the fact that the right palpebral fissure is smaller than the left. The visual fields are full to confrontational testing. The speech appears fluent and clear, but she does not speak my language. Soft palate rises symmetrically and there is no tongue deviation. Hearing is intact to conversational tone. Sensation: Sensation is intact to light touch throughout. Motor: Strength is 5/5 throughout.   Movement examination: Tone: Had significant difficulty assessing this today, probably due to gegenhalten or perhaps due to language barrier and the patient did not understand that I wanted her to relax (although her son and the medical translator were both telling her). Abnormal movements:  There is a rare right upper extremity resting tremor that increases with distraction procedures.  I saw no left upper extremity resting tremor today, which was seen previous.  She does have minor dyskinesia in the legs. Coordination:  There is improved rapid alternating movements on the right. Gait and Station: The patient has some difficulty arising out of a deep-seated chair without the use of the hands.  She is able to do it on the first attempt when she positions her "nose  over the toes."  The patient's stride length is decreased with an increase in the right upper extremity resting tremor and a stooped posture.  She is somewhat unstable.   Labs:  I reviewed labs from 03/21/2014 from her primary care physician.  Her TSH was 2.50.  Her B12 was slightly low at 274.  RPR was negative.  Her AST was 15, ALT 9, alkaline phosphatase 83.    ASSESSMENT/PLAN:  1.   Idiopathic Parkinson's disease, newly diagnosed on 04/08/2014.  The patient has tremor, bradykinesia, rigidity and postural instability.  -Continue on carbidopa/levodopa 25/100, one tablet 3 times per day.  Encouraged her to take this 30 minutes before the meal time.  -We will add Copegus/levodopa 50/200 at bedtime.  Hopefully, this will prevent her from waking up because of tremor.  -Encouraged her to continue to exercise.  -She does have very minor dyskinesia in the legs, but the patient does not notice this, nor does her family. 2.  I will plan on seeing her back in the next 4 months, sooner should new neurologic issues arise.  Greater than 50% of the 25 minute visit spent in counseling discussing safety.

## 2014-07-08 ENCOUNTER — Ambulatory Visit: Payer: Medicare Other | Admitting: Neurology

## 2014-08-18 ENCOUNTER — Other Ambulatory Visit: Payer: Self-pay | Admitting: Family Medicine

## 2014-08-18 DIAGNOSIS — R1310 Dysphagia, unspecified: Secondary | ICD-10-CM

## 2014-08-22 ENCOUNTER — Ambulatory Visit
Admission: RE | Admit: 2014-08-22 | Discharge: 2014-08-22 | Disposition: A | Discharge status: Home / Self Care | Payer: Medicare Other | Source: Ambulatory Visit | Attending: Family Medicine | Admitting: Family Medicine

## 2014-08-22 DIAGNOSIS — R1310 Dysphagia, unspecified: Secondary | ICD-10-CM

## 2014-08-22 IMAGING — RF DG ESOPHAGUS
14 of 17 series · 19 of 24 positions shown · non-contrast
Comparison: None.

CLINICAL DATA: Dysphagia

EXAM:
ESOPHOGRAM/BARIUM SWALLOW
TECHNIQUE: Single contrast examination was performed using  thin barium.
FLUOROSCOPY TIME:  Radiation Exposure Index (as provided by the
fluoroscopic device): 38 Gy per sq cm
If the device does not provide the exposure index:
Fluoroscopy Time:  1 minutes 48 seconds
Number of Acquired Images:

[Series 1: run · 2 of 8 slices shown (1 of 14)]
[im 1/8]
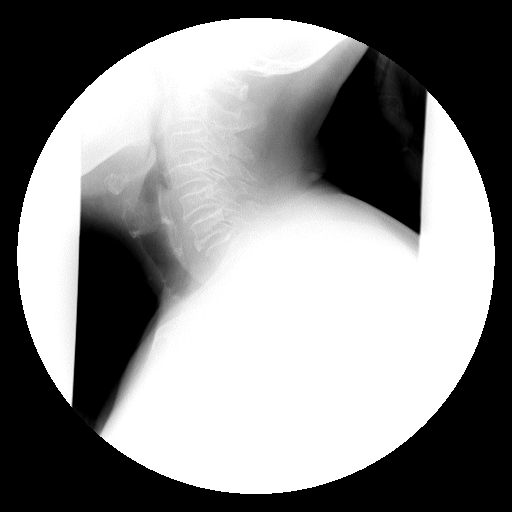
[im 8/8]
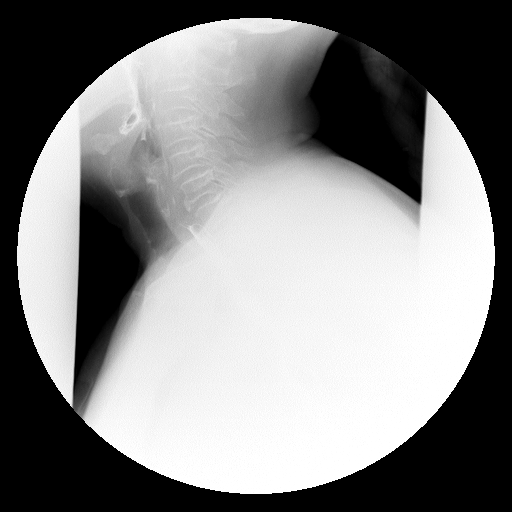

[Series 2: run · 3 of 10 slices shown (2 of 14)]
[im 4/10]
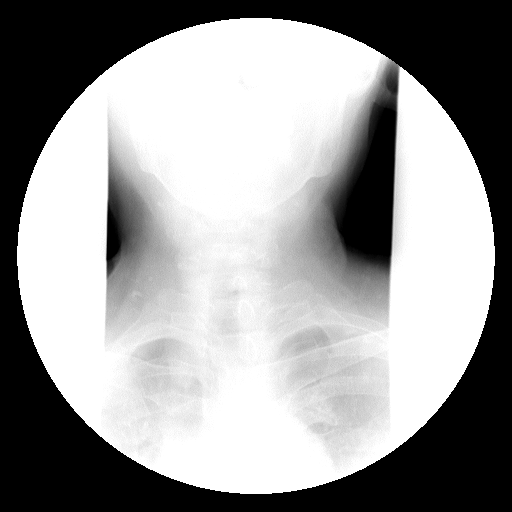
[im 7/10]
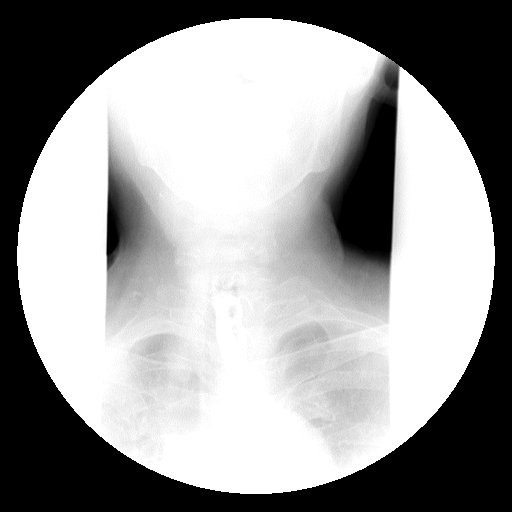
[im 10/10]
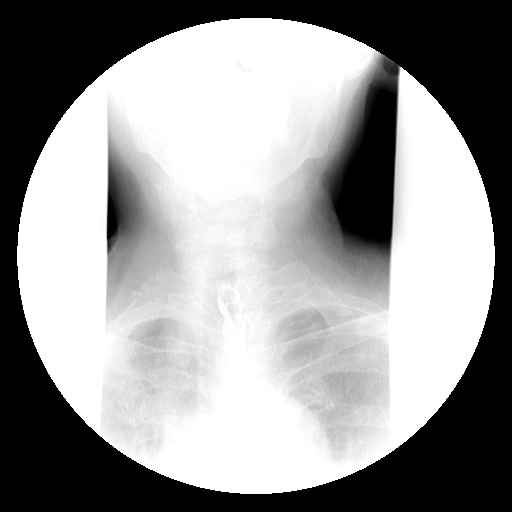

[Series 3: run · 3 of 10 slices shown (3 of 14)]
[im 1/10]
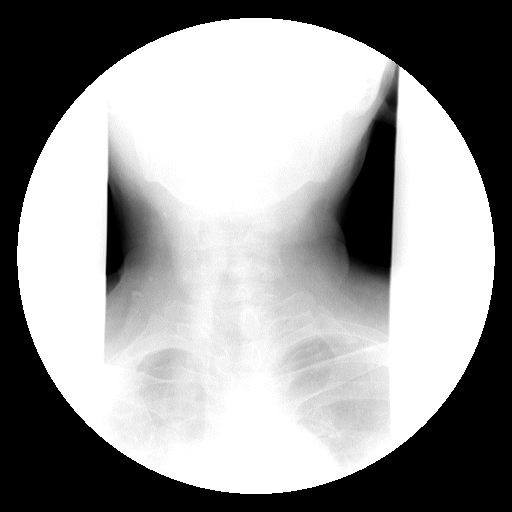
[im 7/10]
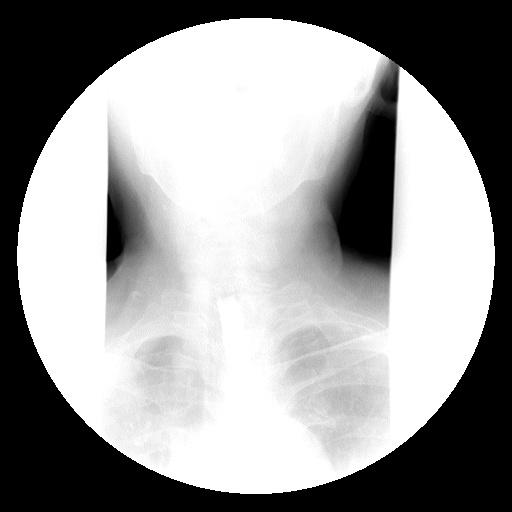
[im 10/10]
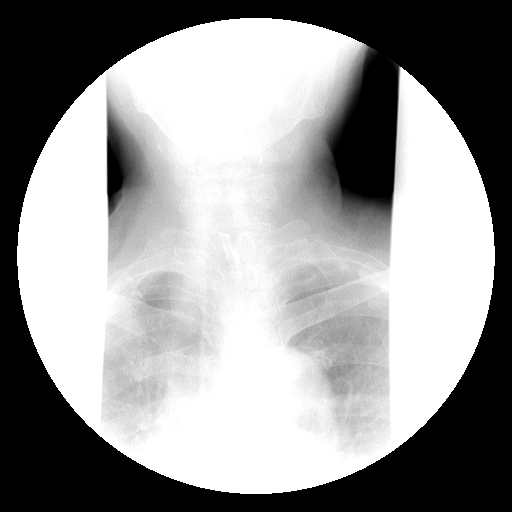

[Series 4: run · 1 of 1 slices shown (4 of 14)]
[im 1/1]
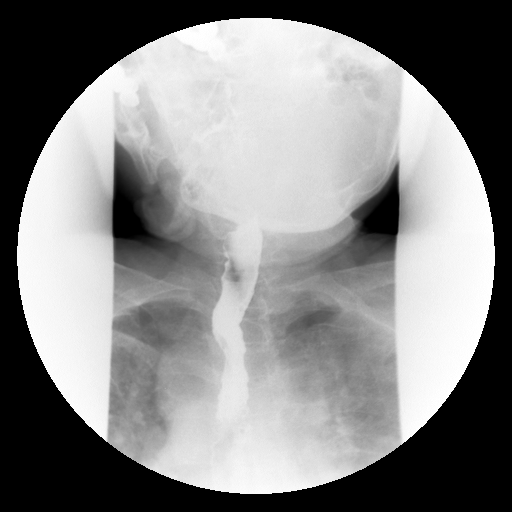

[Series 6: run · 1 of 1 slices shown (5 of 14)]
[im 1/1]
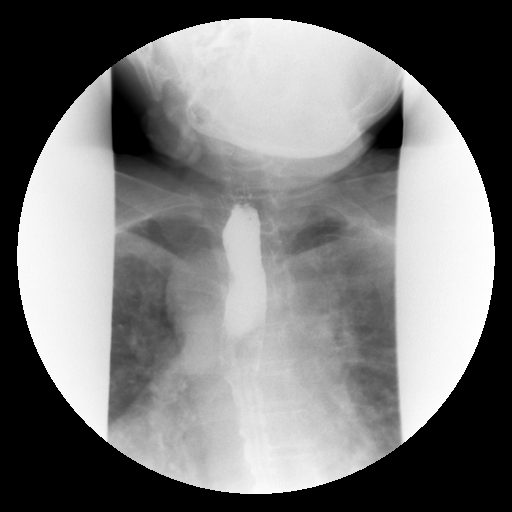

[Series 7: run · 1 of 1 slices shown (6 of 14)]
[im 1/1]
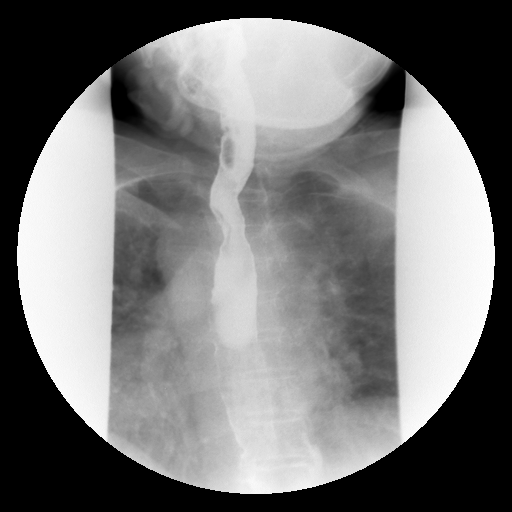

[Series 8: run · 1 of 1 slices shown (7 of 14)]
[im 1/1]
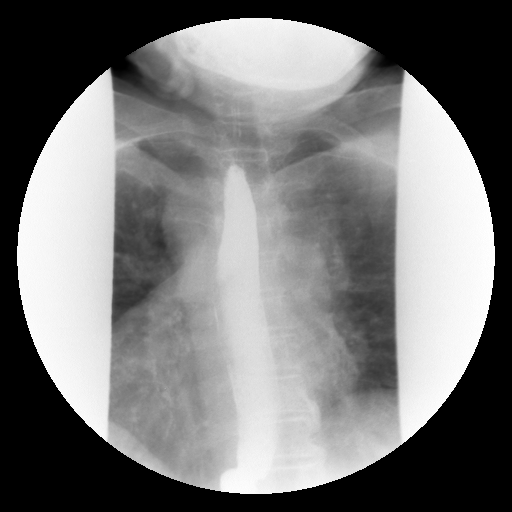

[Series 9: run · 1 of 1 slices shown (8 of 14)]
[im 1/1]
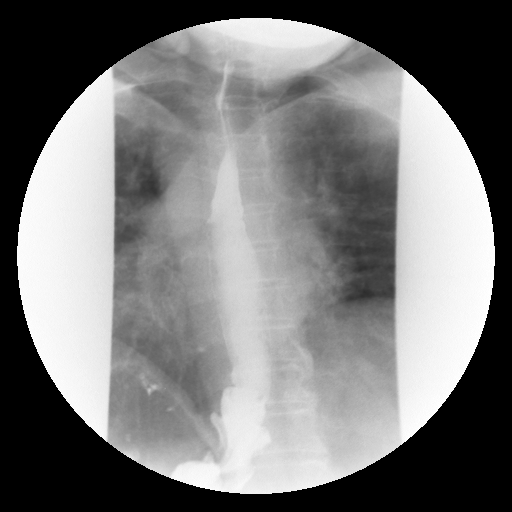

[Series 11: run · 1 of 1 slices shown (9 of 14)]
[im 1/1]
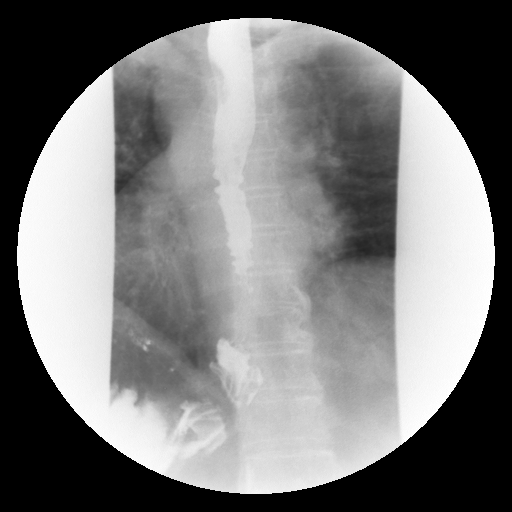

[Series 12: run · 1 of 1 slices shown (10 of 14)]
[im 1/1]
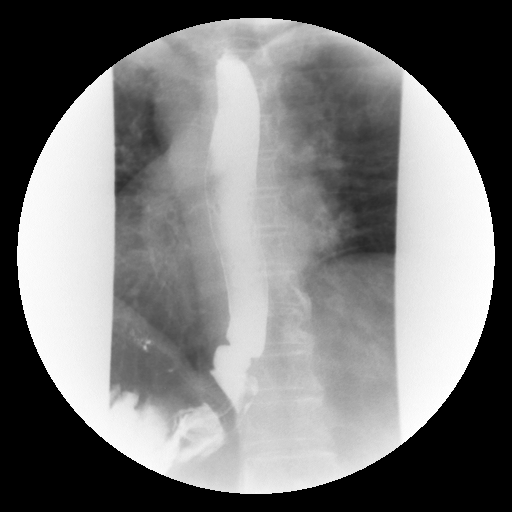

[Series 13: run · 1 of 1 slices shown (11 of 14)]
[im 1/1]
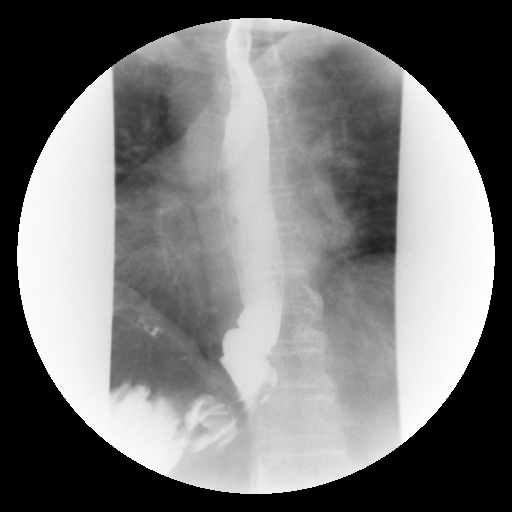

[Series 14: run · 1 of 1 slices shown (12 of 14)]
[im 1/1]
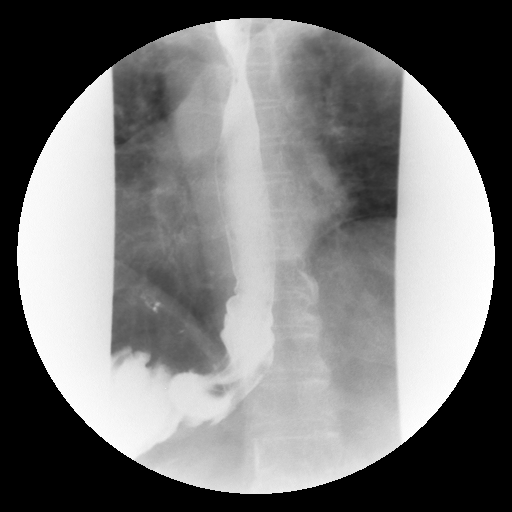

[Series 16: run · 1 of 1 slices shown (13 of 14)]
[im 1/1]
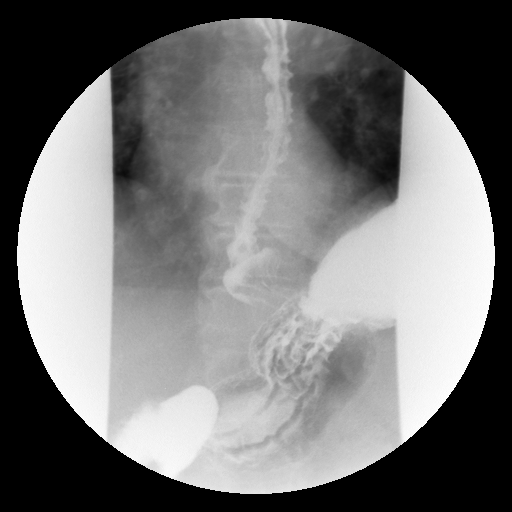

[Series 17: run · 1 of 1 slices shown (14 of 14)]
[im 1/1]
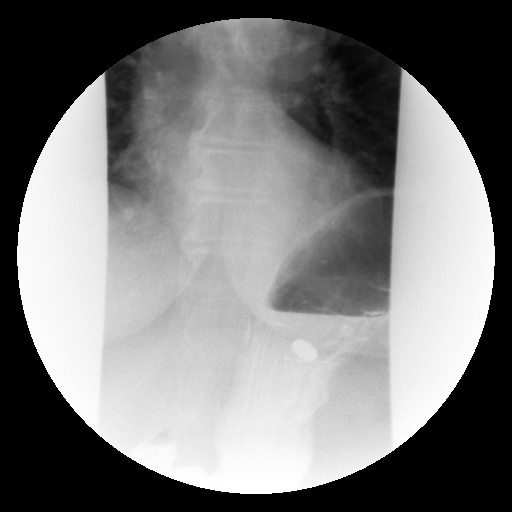

[19 of 24 positions shown; findings below may reference images not displayed]

FINDINGS: A single contrast study was performed with the patient in the
lateral position in view of her history of coughing after
swallowing. There is minimal penetration of barium but no aspiration
of barium is seen. In the frontal view the lower cervical esophagus
deviates slightly to the right of midline. This could be due to
enlargement of the left lobe of thyroid and clinical correlation is
recommended. There are moderate tertiary contractions within the mid
and distal esophagus. A small hiatal hernia is noted. No definite
gastroesophageal reflux could be demonstrated, but in view of the
hiatal hernia reflux is suspected. A barium pill was given which
passed into the stomach without delay.
IMPRESSION: 1. Small hiatal hernia. No definite gastroesophageal reflux could be
demonstrated. The barium pill passes into the stomach without delay.
2. Moderate tertiary contractions in the mid and distal esophagus.
3. Mild penetration barium.  No aspiration.
4. Slight deviation of the lower cervical esophagus to the right.
Question prominent left lobe of thyroid clinically?

## 2014-08-23 ENCOUNTER — Other Ambulatory Visit: Payer: Medicare Other

## 2014-08-24 ENCOUNTER — Other Ambulatory Visit: Payer: Self-pay | Admitting: Family Medicine

## 2014-08-24 DIAGNOSIS — E049 Nontoxic goiter, unspecified: Secondary | ICD-10-CM

## 2014-08-26 ENCOUNTER — Ambulatory Visit
Admission: RE | Admit: 2014-08-26 | Discharge: 2014-08-26 | Disposition: A | Discharge status: Home / Self Care | Payer: Medicare Other | Source: Ambulatory Visit | Attending: Family Medicine | Admitting: Family Medicine

## 2014-08-26 DIAGNOSIS — E049 Nontoxic goiter, unspecified: Secondary | ICD-10-CM

## 2014-08-26 IMAGING — US US SOFT TISSUE HEAD/NECK
1 series · 13 of 25 positions shown · non-contrast
Comparison: None.

CLINICAL DATA: Enlarged thyroid gland.  Difficulty swallowing.

EXAM:
THYROID ULTRASOUND
TECHNIQUE: Ultrasound examination of the thyroid gland and adjacent soft
tissues was performed.

[Series 1: us soft tissue head/neck · 0.07mm/px · 13 of 68 slices shown]
[im 1/68]
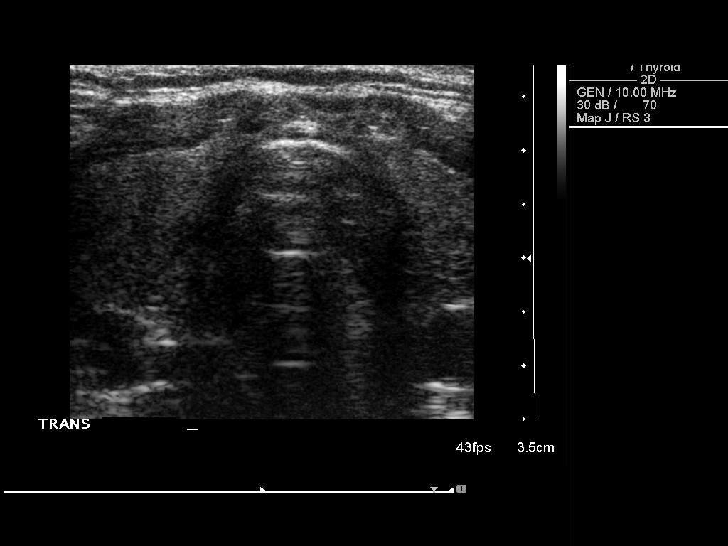
[im 6/68]
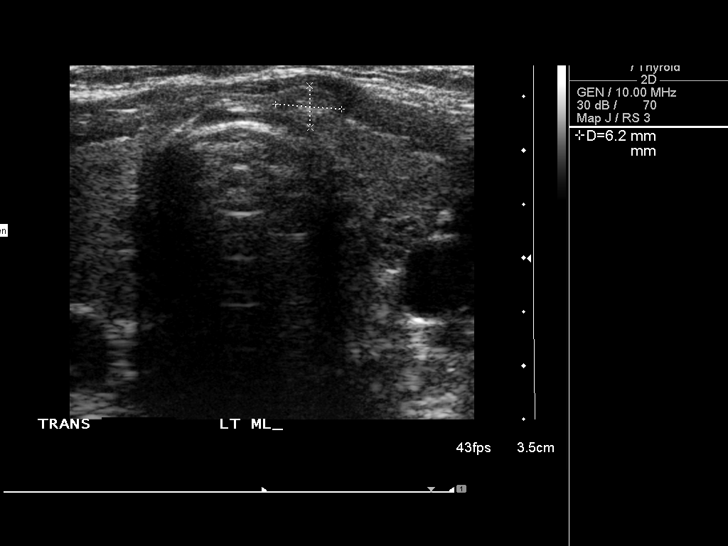
[im 12/68]
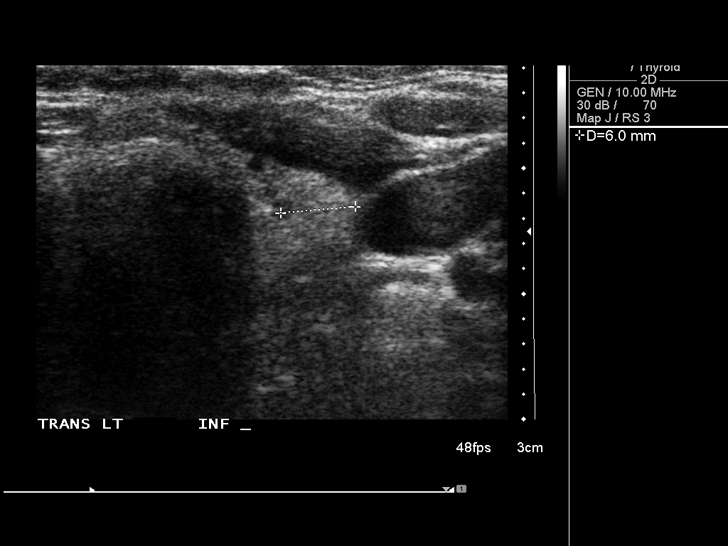
[im 17/68]
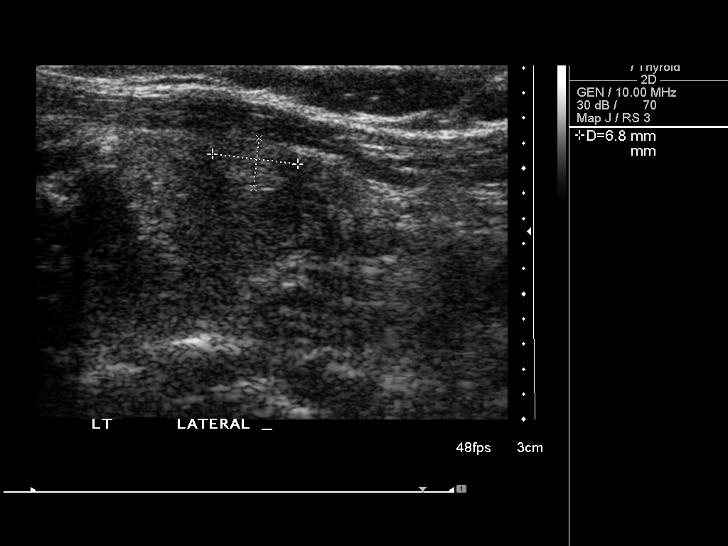
[im 23/68]
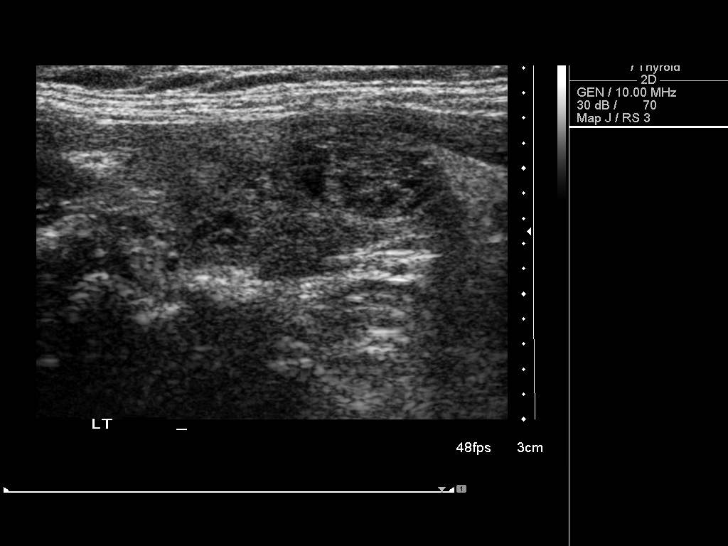
[im 28/68]
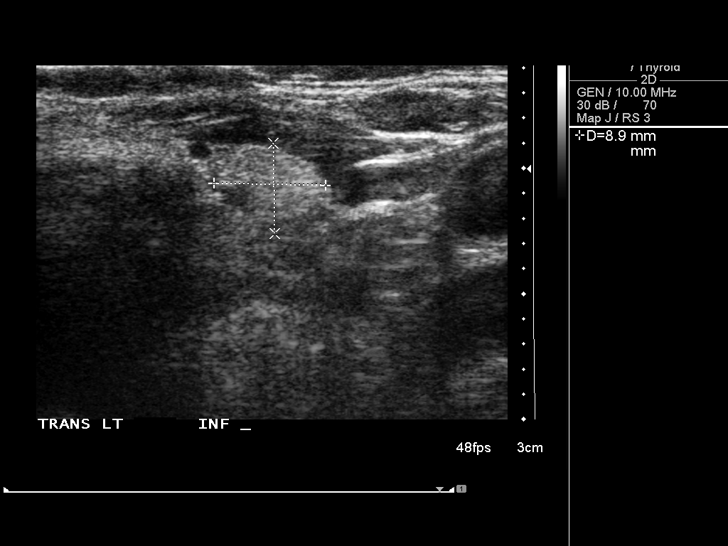
[im 34/68]
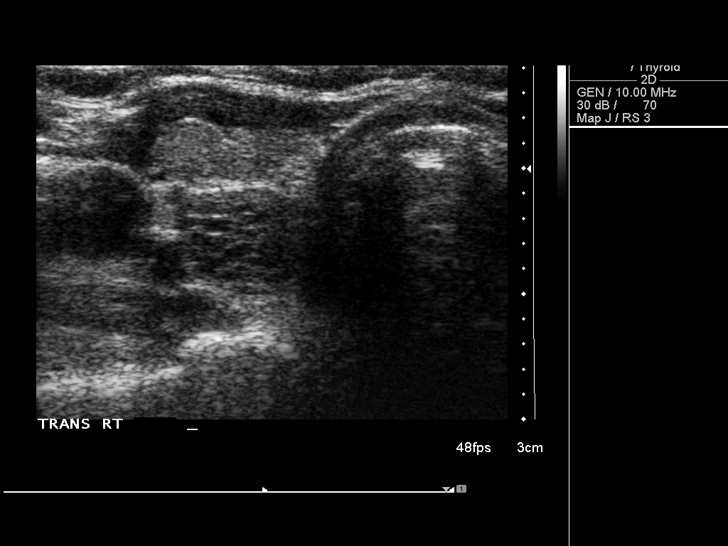
[im 40/68]
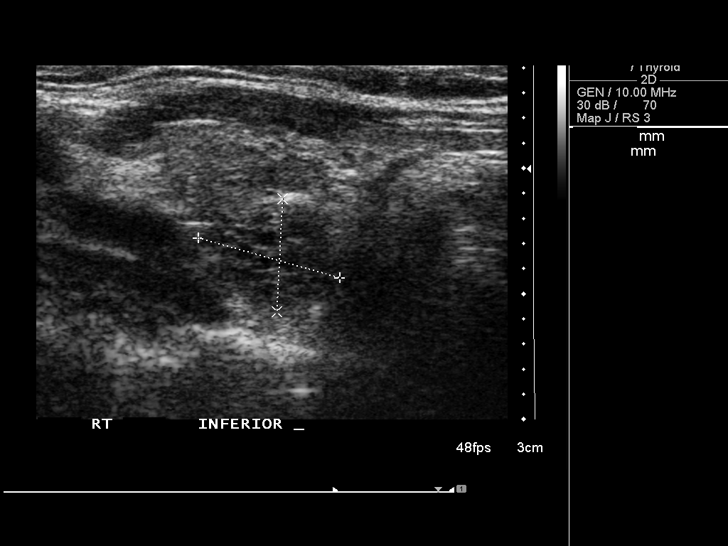
[im 45/68]
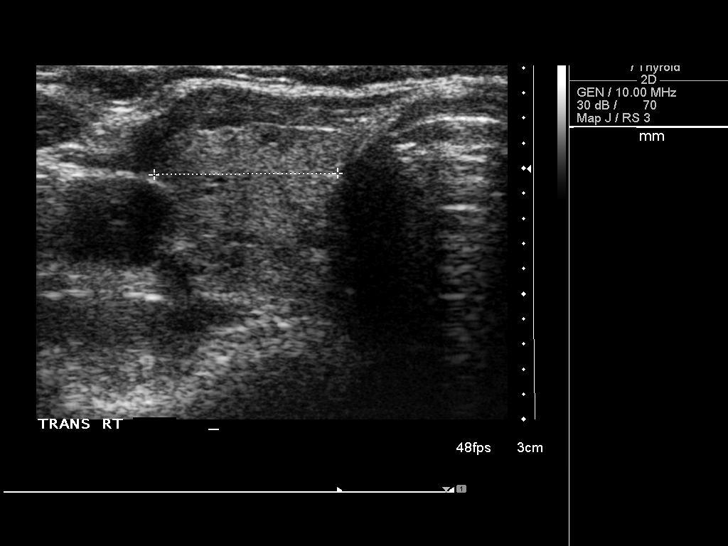
[im 51/68]
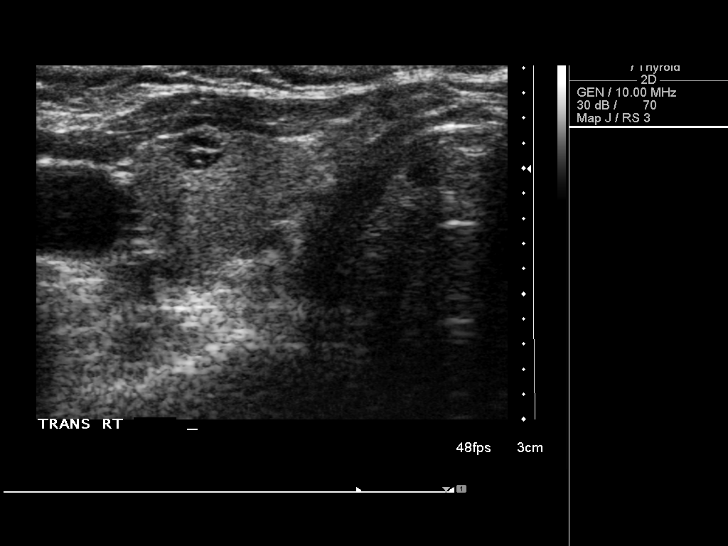
[im 56/68]
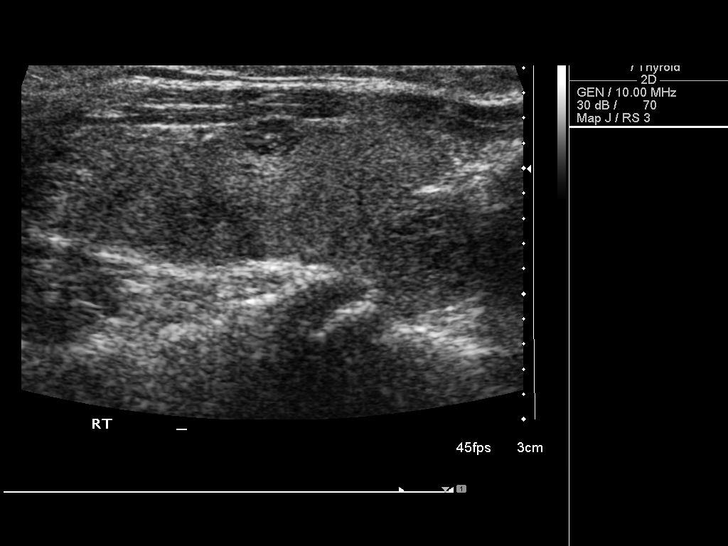
[im 62/68]
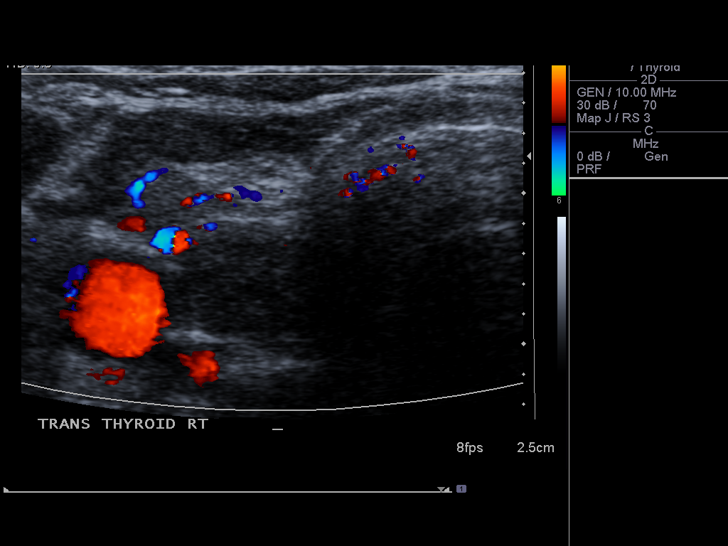
[im 68/68]
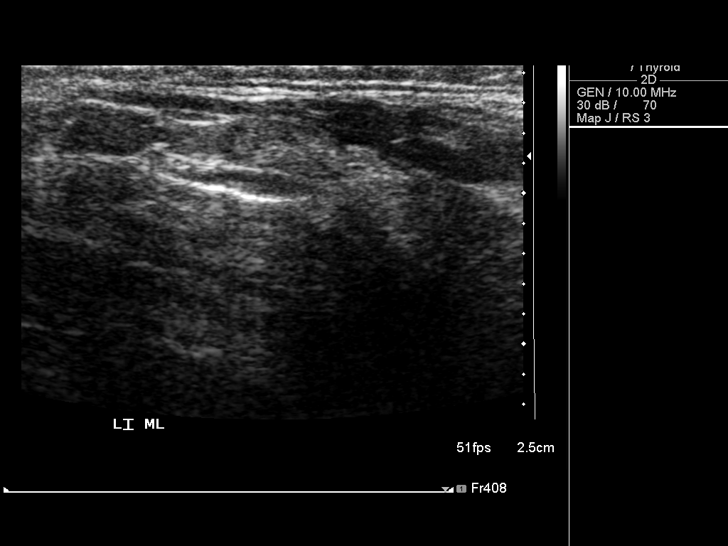

[13 of 25 positions shown; findings below may reference images not displayed]

FINDINGS: Right thyroid lobe

Measurements: 4.0 x 1.4 x 1.4 cm. Tiny mid lobe nodule measures 6
mm. Heterogeneous lower pole nodule posteriorly measures 1.2 x 0.9 x
0.9 cm.

Left thyroid lobe

Measurements: 3.7 x 1.3 x 1.3 cm. 7 mm mid lobe nodule.
Heterogeneous lower pole nodule measures 1.2 x 0.7 x 0.8 cm. There
is an adjacent slightly hyperechoic 9 mm nodule.

Isthmus

Thickness: 3 mm.  Small sub cm nodules in the isthmus are present.

Lymphadenopathy

None visualized.
IMPRESSION: Multiple bilateral nodules are identified. The largest nodules
measure 1.2 cm bilaterally. Findings do not meet current SRU
consensus criteria for biopsy. Follow-up by clinical exam is
recommended. If patient has known risk factors for thyroid
carcinoma, consider follow-up ultrasound in 12 months. If patient is
clinically hyperthyroid, consider nuclear medicine thyroid uptake
and scan.Reference: Management of Thyroid Nodules Detected at US:
Society of Radiologists in Ultrasound Consensus Conference

## 2014-10-12 ENCOUNTER — Telehealth: Payer: Self-pay | Admitting: Neurology

## 2014-10-12 MED ORDER — CARBIDOPA-LEVODOPA 25-100 MG PO TABS: 1.0000 | ORAL_TABLET | Freq: Three times a day (TID) | ORAL | Status: DC

## 2014-10-12 MED ORDER — CARBIDOPA-LEVODOPA ER 50-200 MG PO TBCR: 1.0000 | EXTENDED_RELEASE_TABLET | Freq: Every day | ORAL | Status: DC

## 2014-10-12 NOTE — Telephone Encounter (Signed)
Left message on machine making patient aware RX for 90 day supply sent to pharmacy.

## 2014-10-12 NOTE — Telephone Encounter (Signed)
Pt called and wanted to know if her prescription for Sinemet can be a 90 day supply instead of a 30 day supply/Dawn CB# [phone]

## 2014-11-03 ENCOUNTER — Ambulatory Visit: Payer: Medicare Other | Admitting: Neurology

## 2014-12-22 ENCOUNTER — Encounter: Payer: Self-pay | Admitting: Neurology

## 2014-12-22 ENCOUNTER — Ambulatory Visit (INDEPENDENT_AMBULATORY_CARE_PROVIDER_SITE_OTHER): Payer: Medicare Other | Admitting: Neurology

## 2014-12-22 VITALS — BP 114/60 | HR 70 | Ht 61.0 in | Wt 143.0 lb

## 2014-12-22 DIAGNOSIS — G20 Parkinson's disease: Secondary | ICD-10-CM

## 2014-12-22 MED ORDER — CARBIDOPA-LEVODOPA ER 50-200 MG PO TBCR: 1.0000 | EXTENDED_RELEASE_TABLET | Freq: Every day | ORAL | Status: DC

## 2014-12-22 MED ORDER — CARBIDOPA-LEVODOPA 25-100 MG PO TABS: 1.0000 | ORAL_TABLET | Freq: Three times a day (TID) | ORAL | Status: DC

## 2014-12-22 NOTE — Progress Notes (Signed)
Katherine Novak was seen today in the movement disorders clinic for neurologic consultation at the request of [name], MD.  The consultation is for the evaluation of tremor.  Pt is accompanied by her son who supplements the history.  A medical translator is also present.  Pt states that it started 6 months ago and only in the right hand and no where else.  It is at rest and not with use.  It has increased over 6 months.  No family history of tremor  07/01/14 update:  Pt is f/u today, accompanied by her son and medical translator.  Pt is now on carbidopa/levodopa 25/100 tid.  She has taking it right at the mealtime.  She is doing better in terms of tremor.  It will awaken her up from sleep, however.  She went to PT at [facility] and did well.  No falls.  No lightheadedness, except minor maybe one time a month.  Denies hallucinations.  She is having vivid dreams but not acting them out.  She is not choking on her food.  She denies any side effects with the levodopa.  Her son notices that her facial expression is much improved.  12/22/14 update:  The patient is following up today, accompanied by her son  who supplements the history.  A medical translator was scheduled, but did not show up today and her son asked [place] to continue (in fact, asked [place] not to schedule one again although I told him that we really needed to have one present per our policy).  The patient is on carbidopa/levodopa, 25/100, one tablet 3 times per day in addition to carbidopa/levodopa 50/200 which was added at bedtime last visit.   She thinks that definitely helped her sleep.  She has some tremor but it is not bothersome.   The patient reports that she is doing well.  No falls since last visit.  No hallucinations.  Some lightheadedness in the AM (not frequent) but no near syncope.  Has been doing well in terms of swallowing.  CT of the brain was performed in 2011.  I reviewed these films.  This was nonacute with the exception of  small vessel disease.  PREVIOUS MEDICATIONS: none to date  ALLERGIES:   Allergies  Allergen Reactions  . Sulfa Antibiotics     Angioedema of lips , throat    CURRENT MEDICATIONS:  Outpatient Encounter Prescriptions as of 12/22/2014  Medication Sig  . acetaminophen (TYLENOL) 325 MG tablet Take 650 mg by mouth every 6 (six) hours as needed.  Marland Kitchen alendronate (FOSAMAX) 70 MG tablet Take 70 mg by mouth every 7 (seven) days. Take with a full glass of water on an empty stomach.  On Wednesday  . carbidopa-levodopa (SINEMET CR) 50-200 MG tablet Take 1 tablet by mouth at bedtime.  . carbidopa-levodopa (SINEMET IR) 25-100 MG tablet Take 1 tablet by mouth 3 (three) times daily.  . diclofenac sodium (VOLTAREN) 1 % GEL Apply topically 4 (four) times daily.  . furosemide (LASIX) 20 MG tablet Take 20 mg by mouth daily as needed.  Marland Kitchen HYDROcodone-acetaminophen (NORCO) 10-325 MG per tablet Take 1-2 tablets by mouth every 6 (six) hours as needed for moderate pain or severe pain.  . methocarbamol (ROBAXIN) 500 MG tablet Take 1 tablet (500 mg total) by mouth every 6 (six) hours as needed for muscle spasms.  . metoprolol tartrate (LOPRESSOR) 25 MG tablet Take 25 mg by mouth 2 (two) times daily.  Marland Kitchen omeprazole (PRILOSEC) 20  MG capsule Take 20 mg by mouth daily.  . rosuvastatin (CRESTOR) 5 MG tablet Take 5 mg by mouth daily.  . [DISCONTINUED] aspirin EC 325 MG EC tablet Take 1 tablet (325 mg total) by mouth 2 (two) times daily after a meal.   No facility-administered encounter medications on file as of 12/22/2014.    PAST MEDICAL HISTORY:   Past Medical History  Diagnosis Date  . Hypertension   . Hypercholesterolemia   . GERD (gastroesophageal reflux disease)   . Barrett's esophagus   . Vitamin D deficiency   . Osteoporosis   . Arthritis   . AAA (abdominal aortic aneurysm) ([place])   . Leukemia ([place])     CLL observation  dr Inda Merlin cone  . History of recurrent UTIs   . Blood dyscrasia     leukemia cll     PAST SURGICAL HISTORY:   Past Surgical History  Procedure Laterality Date  . Carpel tunnel-left    . Cataract extraction  bilateral  . Total knee arthroplasty Left 06/23/2012    Procedure: TOTAL KNEE ARTHROPLASTY;  Surgeon: Hessie Dibble, MD;  Location: [facility];  Service: Orthopedics;  Laterality: Left;  DEPUY, RNFA  . Total knee arthroplasty Right 08/10/2013    Procedure: TOTAL KNEE ARTHROPLASTY;  Surgeon: Hessie Dibble, MD;  Location: [place] [facility];  Service: Orthopedics;  Laterality: Right;    SOCIAL HISTORY:   Social History   Social History  . Marital Status: Widowed    Spouse Name: N/A  . Number of Children: N/A  . Years of Education: N/A   Occupational History  . Not on file.   Social History Main Topics  . Smoking status: Never Smoker   . Smokeless tobacco: Never Used  . Alcohol Use: No  . Drug Use: No  . Sexual Activity: Not on file   Other Topics Concern  . Not on file   Social History Narrative    FAMILY HISTORY:   Family Status  Relation Status Death Age  . Mother Deceased     cancer (unknown kind)  . Father Deceased     asthma  . Sister Deceased     asthma  . Brother Deceased     throat cancer  . Brother Deceased     asthma  . Son Alive     healthy  . Son Alive     healthy  . Daughter Alive     rheumatoid arthritis, thyroid disease  . Daughter Alive     healthy    ROS:  A complete 10 system review of systems was obtained and was unremarkable apart from what is mentioned above.  PHYSICAL EXAMINATION:    VITALS:   Filed Vitals:   12/22/14 1112  BP: 114/60  Pulse: 70  Height: 5\' 1"  (1.549 m)  Weight: 143 lb (64.864 kg)    GEN:  The patient appears stated age and is in NAD. HEENT:  Normocephalic, atraumatic.  The mucous membranes are moist. The superficial temporal arteries are without ropiness or tenderness. CV:  RRR Lungs:  CTAB Neck/HEME:  There are no carotid bruits bilaterally.  Neurological examination:  Orientation:  difficult to assess via translator as both son and medical translator are helping to translate.  Is very alert and responsive to commands.   Cranial nerves: There is good facial symmetry with the exception of the fact that the right palpebral fissure is smaller than the left. The visual fields are full to confrontational testing. The speech appears fluent  and clear, but she does not speak my language. Soft palate rises symmetrically and there is no tongue deviation. Hearing is intact to conversational tone. Sensation: Sensation is intact to light touch throughout. Motor: Strength is 5/5 throughout.   Movement examination: Tone: Had significant difficulty assessing this today as last visit, probably due to gegenhalten or perhaps due to language barrier and the patient did not understand that I wanted her to relax Abnormal movements: There is a rare right upper extremity resting tremor that increases with distraction procedures.  I saw no left upper extremity resting tremor today, which was seen previous.  No dyskinesia Coordination:  There is good rapid alternating movements today Gait and Station: The patient has some difficulty arising out of a deep-seated chair without the use of the hands.   The patient's stride length is decreased but she does not shuffle.  She does not turn en bloc.  She is using her cane today.  Labs:  I reviewed labs from 03/21/2014 from her primary care physician.  Her TSH was 2.50.  Her B12 was slightly low at 274.  RPR was negative.  Her AST was 15, ALT 9, alkaline phosphatase 83.    ASSESSMENT/PLAN:  1.   Idiopathic Parkinson's disease, newly diagnosed on 04/08/2014.  The patient has tremor, bradykinesia, rigidity and postural instability.  -Continue on carbidopa/levodopa 25/100, one tablet 3 times per day.  Encouraged her to take this 30 minutes before the meal time.  -We continue carbidopa/levodopa 50/200 at night.  -Encouraged her to continue to exercise. 2.  I will  plan on seeing her back in the next 4 months, sooner should new neurologic issues arise.

## 2015-04-26 ENCOUNTER — Encounter: Payer: Self-pay | Admitting: Neurology

## 2015-04-26 ENCOUNTER — Ambulatory Visit (INDEPENDENT_AMBULATORY_CARE_PROVIDER_SITE_OTHER): Payer: Medicare Other | Admitting: Neurology

## 2015-04-26 VITALS — BP 104/70 | HR 62 | Ht 61.0 in | Wt 140.0 lb

## 2015-04-26 DIAGNOSIS — G20 Parkinson's disease: Secondary | ICD-10-CM | POA: Diagnosis not present

## 2015-04-26 NOTE — Progress Notes (Signed)
Katherine Novak was seen today in the movement disorders clinic for neurologic consultation at the request of [name], MD.  The consultation is for the evaluation of tremor.  Pt is accompanied by her son who supplements the history.  A medical translator is also present.  Pt states that it started 6 months ago and only in the right hand and no where else.  It is at rest and not with use.  It has increased over 6 months.  No family history of tremor  07/01/14 update:  Pt is f/u today, accompanied by her son and medical translator.  Pt is now on carbidopa/levodopa 25/100 tid.  She has taking it right at the mealtime.  She is doing better in terms of tremor.  It will awaken her up from sleep, however.  She went to PT at [facility] and did well.  No falls.  No lightheadedness, except minor maybe one time a month.  Denies hallucinations.  She is having vivid dreams but not acting them out.  She is not choking on her food.  She denies any side effects with the levodopa.  Her son notices that her facial expression is much improved.  12/22/14 update:  The patient is following up today, accompanied by her son  who supplements the history.  A medical translator was scheduled, but did not show up today and her son asked [place] to continue (in fact, asked [place] not to schedule one again although I told him that we really needed to have one present per our policy).  The patient is on carbidopa/levodopa, 25/100, one tablet 3 times per day in addition to carbidopa/levodopa 50/200 which was added at bedtime last visit.   She thinks that definitely helped her sleep.  She has some tremor but it is not bothersome.   The patient reports that she is doing well.  No falls since last visit.  No hallucinations.  Some lightheadedness in the AM (not frequent) but no near syncope.  Has been doing well in terms of swallowing.  04/26/15 update:  The patient is following up today, accompanied by her son  who supplements the history.   She is having some tremor in the R hand but it is not very bothersome.  Never tremor at other times of day.  She cannot state if tremor happens more as med wears off.   carbidopa/levodopa 25/100 is given at 8am/noon and 6pm and then takes the carbidopa/levodopa 50/200 q hs.  No hallucinations.  Rare lightheaded.  No falls.  Walks for exercise.    CT of the brain was performed in 2011.  I reviewed these films.  This was nonacute with the exception of small vessel disease.  PREVIOUS MEDICATIONS: none to date  ALLERGIES:   Allergies  Allergen Reactions  . Sulfa Antibiotics     Angioedema of lips , throat    CURRENT MEDICATIONS:  Outpatient Encounter Prescriptions as of 04/26/2015  Medication Sig  . acetaminophen (TYLENOL) 325 MG tablet Take 650 mg by mouth every 6 (six) hours as needed.  Marland Kitchen alendronate (FOSAMAX) 70 MG tablet Take 70 mg by mouth every 7 (seven) days. Take with a full glass of water on an empty stomach.  On Wednesday  . carbidopa-levodopa (SINEMET CR) 50-200 MG tablet Take 1 tablet by mouth at bedtime.  . carbidopa-levodopa (SINEMET IR) 25-100 MG tablet Take 1 tablet by mouth 3 (three) times daily.  . diclofenac sodium (VOLTAREN) 1 % GEL Apply topically 4 (four)  times daily.  . furosemide (LASIX) 20 MG tablet Take 20 mg by mouth daily as needed.  Marland Kitchen HYDROcodone-acetaminophen (NORCO) 10-325 MG per tablet Take 1-2 tablets by mouth every 6 (six) hours as needed for moderate pain or severe pain.  . metoprolol tartrate (LOPRESSOR) 25 MG tablet Take 25 mg by mouth 2 (two) times daily.  Marland Kitchen omeprazole (PRILOSEC) 20 MG capsule Take 20 mg by mouth daily.  . rosuvastatin (CRESTOR) 5 MG tablet Take 5 mg by mouth daily.  . [DISCONTINUED] methocarbamol (ROBAXIN) 500 MG tablet Take 1 tablet (500 mg total) by mouth every 6 (six) hours as needed for muscle spasms.   No facility-administered encounter medications on file as of 04/26/2015.    PAST MEDICAL HISTORY:   Past Medical History    Diagnosis Date  . Hypertension   . Hypercholesterolemia   . GERD (gastroesophageal reflux disease)   . Barrett's esophagus   . Vitamin D deficiency   . Osteoporosis   . Arthritis   . AAA (abdominal aortic aneurysm) ([place])   . Leukemia ([place])     CLL observation  dr Inda Merlin cone  . History of recurrent UTIs   . Blood dyscrasia     leukemia cll    PAST SURGICAL HISTORY:   Past Surgical History  Procedure Laterality Date  . Carpel tunnel-left    . Cataract extraction  bilateral  . Total knee arthroplasty Left 06/23/2012    Procedure: TOTAL KNEE ARTHROPLASTY;  Surgeon: Hessie Dibble, MD;  Location: [facility];  Service: Orthopedics;  Laterality: Left;  DEPUY, RNFA  . Total knee arthroplasty Right 08/10/2013    Procedure: TOTAL KNEE ARTHROPLASTY;  Surgeon: Hessie Dibble, MD;  Location: [facility];  Service: Orthopedics;  Laterality: Right;    SOCIAL HISTORY:   Social History   Social History  . Marital Status: Widowed    Spouse Name: N/A  . Number of Children: N/A  . Years of Education: N/A   Occupational History  . Not on file.   Social History Main Topics  . Smoking status: Never Smoker   . Smokeless tobacco: Never Used  . Alcohol Use: No  . Drug Use: No  . Sexual Activity: Not on file   Other Topics Concern  . Not on file   Social History Narrative    FAMILY HISTORY:   Family Status  Relation Status Death Age  . Mother Deceased     cancer (unknown kind)  . Father Deceased     asthma  . Sister Deceased     asthma  . Brother Deceased     throat cancer  . Brother Deceased     asthma  . Son Alive     healthy  . Son Alive     healthy  . Daughter Alive     rheumatoid arthritis, thyroid disease  . Daughter Alive     healthy    ROS:  A complete 10 system review of systems was obtained and was unremarkable apart from what is mentioned above.  PHYSICAL EXAMINATION:    VITALS:   Filed Vitals:   04/26/15 1117  BP: 104/70  Pulse: 62  Height: 5\' 1"  (1.549  m)  Weight: 140 lb (63.504 kg)    GEN:  The patient appears stated age and is in NAD. HEENT:  Normocephalic, atraumatic.  The mucous membranes are moist. The superficial temporal arteries are without ropiness or tenderness. CV:  RRR Lungs:  CTAB Neck/HEME:  There are no carotid bruits  bilaterally.  Neurological examination:  Orientation: difficult to assess given language barrier.  Is very alert and responsive to commands.   Cranial nerves: There is good facial symmetry with the exception of the fact that the right palpebral fissure is smaller than the left. The visual fields are full to confrontational testing. The speech appears fluent and clear, but she does not speak my language. Soft palate rises symmetrically and there is no tongue deviation. Hearing is intact to conversational tone. Sensation: Sensation is intact to light touch throughout. Motor: Strength is 5/5 throughout.   Movement examination: Tone: Had significant difficulty assessing this today as previous visits as pt doesn't understand that I want her to relax but think that there is mod increased tone in RUE Abnormal movements: There is an intermittent right upper extremity resting tremor that increases with distraction procedures.  I saw no left upper extremity resting tremor today, which was seen previous.  No dyskinesia Coordination:  There is good rapid alternating movements today Gait and Station: The patient has some difficulty arising out of a deep-seated chair without the use of the hands.   The patient's stride length is decreased but she does not shuffle.  She does not turn en bloc.  She is using her cane today.  Labs:  I reviewed labs from 03/21/2014 from her primary care physician.  Her TSH was 2.50.  Her B12 was slightly low at 274.  RPR was negative.  Her AST was 15, ALT 9, alkaline phosphatase 83.    ASSESSMENT/PLAN:  1.   Idiopathic Parkinson's disease, newly diagnosed on 04/08/2014.  The patient has  tremor, bradykinesia, rigidity and postural instability.  -Continue on carbidopa/levodopa 25/100, one tablet 3 times per day.  Encouraged her to take this 30 minutes before the meal time.  Thought that it would be good idea to consider increase but pt did not want to.  Told her that if she falls, will be hard for her to stick out hand and protect face.  Son/pt understand.  Told them she will likely need increase soon (next 6-8 months).  -We continue carbidopa/levodopa 50/200 at night.  -Encouraged her to continue to exercise. 2.  I will plan on seeing her back in the next 4 months, sooner should new neurologic issues arise.   Much greater than 50% of this visit was spent in counseling and coordinating care.  Total face to face time:  25 min

## 2015-06-10 ENCOUNTER — Other Ambulatory Visit: Payer: Self-pay | Admitting: Neurology

## 2015-06-12 NOTE — Telephone Encounter (Signed)
Carbidopa Levodopa 50/200 refill requested. Per last office note- patient to remain on medication. Refill approved and sent to patient's pharmacy.

## 2015-08-29 ENCOUNTER — Ambulatory Visit: Payer: Medicare Other | Admitting: Neurology

## 2015-09-10 ENCOUNTER — Other Ambulatory Visit: Payer: Self-pay | Admitting: Neurology

## 2015-09-20 ENCOUNTER — Ambulatory Visit: Payer: Medicare Other | Admitting: Neurology

## 2015-10-10 ENCOUNTER — Ambulatory Visit (INDEPENDENT_AMBULATORY_CARE_PROVIDER_SITE_OTHER): Payer: Medicare Other | Admitting: Neurology

## 2015-10-10 ENCOUNTER — Encounter: Payer: Self-pay | Admitting: Neurology

## 2015-10-10 VITALS — BP 120/60 | HR 67 | Ht 61.0 in | Wt 140.0 lb

## 2015-10-10 DIAGNOSIS — G20 Parkinson's disease: Secondary | ICD-10-CM

## 2015-10-10 MED ORDER — CARBIDOPA-LEVODOPA ER 50-200 MG PO TBCR: 1.0000 | EXTENDED_RELEASE_TABLET | Freq: Every day | ORAL | 1 refills | Status: DC

## 2015-10-10 MED ORDER — CARBIDOPA-LEVODOPA 25-100 MG PO TABS: ORAL_TABLET | ORAL | 1 refills | Status: DC

## 2015-10-10 NOTE — Patient Instructions (Signed)
Increase carbidopa/levodopa 25/100 to 2 in the AM, 1 before lunch and 1 before dinner and continue carbidopa/levodopa 50/200 at night (bedtime)

## 2015-10-10 NOTE — Progress Notes (Signed)
Katherine Novak was seen today in the movement disorders clinic for neurologic consultation at the request of Katherine Coma, MD.  The consultation is for the evaluation of tremor.  Pt is accompanied by her son who supplements the history.  A medical translator is also present.  Pt states that it started 6 months ago and only in the right hand and no where else.  It is at rest and not with use.  It has increased over 6 months.  No family history of tremor  07/01/14 update:  Pt is f/u today, accompanied by her son and medical translator.  Pt is now on carbidopa/levodopa 25/100 tid.  She has taking it right at the mealtime.  She is doing better in terms of tremor.  It will awaken her up from sleep, however.  She went to PT at [facility] and did well.  No falls.  No lightheadedness, except minor maybe one time a month.  Denies hallucinations.  She is having vivid dreams but not acting them out.  She is not choking on her food.  She denies any side effects with the levodopa.  Her son notices that her facial expression is much improved.  12/22/14 update:  The patient is following up today, accompanied by her son  who supplements the history.  A medical translator was scheduled, but did not show up today and her son asked Katherine Novak to continue (in fact, asked Katherine Novak not to schedule one again although I told him that we really needed to have one present per our policy).  The patient is on carbidopa/levodopa, 25/100, one tablet 3 times per day in addition to carbidopa/levodopa 50/200 which was added at bedtime last visit.   She thinks that definitely helped her sleep.  She has some tremor but it is not bothersome.   The patient reports that she is doing well.  No falls since last visit.  No hallucinations.  Some lightheadedness in the AM (not frequent) but no near syncope.  Has been doing well in terms of swallowing.  04/26/15 update:  The patient is following up today, accompanied by her son  who supplements the history.   She is having some tremor in the R hand but it is not very bothersome.  Never tremor at other times of day.  She cannot state if tremor happens more as med wears off.   carbidopa/levodopa 25/100 is given at 8am/noon and 6pm and then takes the carbidopa/levodopa 50/200 q hs.  No hallucinations.  Rare lightheaded.  No falls.  Walks for exercise.    10/10/15 update:   The patient follows up today, complete by her son who supplements the history.  She remains on carbidopa/levodopa 25/100, one tablet 3 times per day in addition to carbidopa/levodopa 50/200 at night.  She continues to walk some for exercise, but otherwise gets no cardiovascular exercise.  No hallucinations.  Had a fall last month when staying with her other son in [place] but this son doesn't know details of it and pt doesn't remember.    Her swallowing is usually good but has some occasional trouble with food.  Sleeping well.  CT of the brain was performed in 2011.  I reviewed these films.  This was nonacute with the exception of small vessel disease.  PREVIOUS MEDICATIONS: none to date  ALLERGIES:   Allergies  Allergen Reactions  . Sulfa Antibiotics     Angioedema of lips , throat    CURRENT MEDICATIONS:  Outpatient Encounter Prescriptions  as of 10/10/2015  Medication Sig  . acetaminophen (TYLENOL) 325 MG tablet Take 650 mg by mouth every 6 (six) hours as needed.  Marland Kitchen alendronate (FOSAMAX) 70 MG tablet Take 70 mg by mouth every 7 (seven) days. Take with a full glass of water on an empty stomach.  On Wednesday  . carbidopa-levodopa (SINEMET CR) 50-200 MG tablet TAKE 1 TABLET BY MOUTH AT BEDTIME.  . carbidopa-levodopa (SINEMET IR) 25-100 MG tablet TAKE 1 TABLET BY MOUTH 3 (THREE) TIMES DAILY.  Marland Kitchen diclofenac sodium (VOLTAREN) 1 % GEL Apply topically 4 (four) times daily.  . furosemide (LASIX) 20 MG tablet Take 20 mg by mouth daily as needed.  Marland Kitchen HYDROcodone-acetaminophen (NORCO) 10-325 MG per tablet Take 1-2 tablets by mouth every 6  (six) hours as needed for moderate pain or severe pain.  . metoprolol tartrate (LOPRESSOR) 25 MG tablet Take 25 mg by mouth 2 (two) times daily.  Marland Kitchen omeprazole (PRILOSEC) 20 MG capsule Take 20 mg by mouth daily.  . rosuvastatin (CRESTOR) 5 MG tablet Take 5 mg by mouth daily.   No facility-administered encounter medications on file as of 10/10/2015.     PAST MEDICAL HISTORY:   Past Medical History:  Diagnosis Date  . AAA (abdominal aortic aneurysm) ([place])   . Arthritis   . Barrett's esophagus   . Blood dyscrasia    leukemia cll  . GERD (gastroesophageal reflux disease)   . History of recurrent UTIs   . Hypercholesterolemia   . Hypertension   . Leukemia ([place])    CLL observation  dr Inda Merlin cone  . Osteoporosis   . Vitamin D deficiency     PAST SURGICAL HISTORY:   Past Surgical History:  Procedure Laterality Date  . carpel tunnel-left    . CATARACT EXTRACTION  bilateral  . TOTAL KNEE ARTHROPLASTY Left 06/23/2012   Procedure: TOTAL KNEE ARTHROPLASTY;  Surgeon: Hessie Dibble, MD;  Location: [facility];  Service: Orthopedics;  Laterality: Left;  DEPUY, RNFA  . TOTAL KNEE ARTHROPLASTY Right 08/10/2013   Procedure: TOTAL KNEE ARTHROPLASTY;  Surgeon: Hessie Dibble, MD;  Location: [facility];  Service: Orthopedics;  Laterality: Right;    SOCIAL HISTORY:   Social History   Social History  . Marital status: Widowed    Spouse name: N/A  . Number of children: N/A  . Years of education: N/A   Occupational History  . Not on file.   Social History Main Topics  . Smoking status: Never Smoker  . Smokeless tobacco: Never Used  . Alcohol use No  . Drug use: No  . Sexual activity: Not on file   Other Topics Concern  . Not on file   Social History Narrative  . No narrative on file    FAMILY HISTORY:   Family Status  Relation Status  . Mother Deceased   cancer (unknown kind)  . Father Deceased   asthma  . Sister Deceased   asthma  . Brother Deceased   throat cancer  . Brother  Deceased   asthma  . Son Alive   healthy  . Son Alive   healthy  . Daughter Alive   rheumatoid arthritis, thyroid disease  . Daughter Alive   healthy    ROS:  A complete 10 system review of systems was obtained and was unremarkable apart from what is mentioned above.  PHYSICAL EXAMINATION:    VITALS:   There were no vitals filed for this visit.  GEN:  The patient appears stated age  and is in NAD. HEENT:  Normocephalic, atraumatic.  The mucous membranes are moist. The superficial temporal arteries are without ropiness or tenderness. CV:  RRR Lungs:  CTAB Neck/HEME:  There are no carotid bruits bilaterally.  Neurological examination:  Orientation: difficult to assess given language barrier.  Is very alert and responsive to commands.   Cranial nerves: There is good facial symmetry with the exception of the fact that the right palpebral fissure is smaller than the left. The visual fields are full to confrontational testing. The speech appears fluent and clear, but she does not speak my language. Soft palate rises symmetrically and there is no tongue deviation. Hearing is intact to conversational tone. Sensation: Sensation is intact to light touch throughout. Motor: Strength is 5/5 throughout.   Movement examination: Tone: Had significant difficulty assessing this today as previous visits as pt doesn't understand that I want her to relax but think that there is mod increased tone in RUE Abnormal movements: There is an more prominent right upper extremity resting tremor.    I saw no left upper extremity resting tremor today, which was seen previous.  No dyskinesia Coordination:  There is decremation with hand opening/closing on the R and trouble with alternation of supination/pronation on the right Gait and Station: The patient has some difficulty arising out of a deep-seated chair without the use of the hands.   The patient's stride length is decreased but she does not shuffle. She  does drag the R leg.   She does not turn en bloc.  She is using her cane today.  Labs:  I reviewed labs from 03/21/2014 from her primary care physician.  Her TSH was 2.50.  Her B12 was slightly low at 274.  RPR was negative.  Her AST was 15, ALT 9, alkaline phosphatase 83.    ASSESSMENT/PLAN:  1.   Idiopathic Parkinson's disease, newly diagnosed on 04/08/2014.  The patient has tremor, bradykinesia, rigidity and postural instability.  -Increase carbidopa/levodopa 25/100, 2/1/2.    -We continue carbidopa/levodopa 50/200 at night.  -talked about PT as she just had a fall and they are going to think about it.  Want to ask their neighbor who is a PT that speaks her language.  Told him if she is willing to call Katherine Novak and will send an order to the company she works for.  -Encouraged her to continue to exercise. 2.  I will plan on seeing her back in the next 4 months, sooner should new neurologic issues arise.   Much greater than 50% of this visit was spent in counseling and coordinating care.  Total face to face time:  25 min

## 2015-10-26 ENCOUNTER — Ambulatory Visit: Payer: Medicare Other | Admitting: Neurology

## 2015-11-28 ENCOUNTER — Other Ambulatory Visit: Payer: Self-pay | Admitting: Neurology

## 2015-11-29 NOTE — Telephone Encounter (Signed)
RX refill sent. Patient to continue per last OV note.

## 2015-12-20 ENCOUNTER — Telehealth: Payer: Self-pay | Admitting: Neurology

## 2015-12-20 ENCOUNTER — Encounter: Payer: Self-pay | Admitting: Neurology

## 2015-12-20 DIAGNOSIS — G20 Parkinson's disease: Secondary | ICD-10-CM

## 2015-12-20 DIAGNOSIS — R4781 Slurred speech: Secondary | ICD-10-CM

## 2015-12-20 DIAGNOSIS — R432 Parageusia: Secondary | ICD-10-CM

## 2015-12-20 NOTE — Telephone Encounter (Signed)
That could be associated with Parkinson's disease itself as it can cause slurring of speech but lets go ahead and do MRI brain to make sure that nothing new (without gad)

## 2015-12-20 NOTE — Telephone Encounter (Signed)
Spoke with patient's son Katherine Novak, [phone], and he states patient has been having issues with slurring of speech. He states it happens about once a week and goes away. She has also had no taste with food for about a month. She has issues with swallowing- but that has been going on awhile and doesn't seem to be worse.   Please advise.

## 2015-12-20 NOTE — Telephone Encounter (Signed)
Spoke with patient's son and he agrees to MR. Order entered and they will call him to schedule.

## 2016-04-09 NOTE — Progress Notes (Signed)
Katherine Novak was seen today in the movement disorders clinic for neurologic consultation at the request of Katherine Coma, MD.  The consultation is for the evaluation of tremor.  Pt is accompanied by her son who supplements the history.  A medical translator is also present.  Pt states that it started 6 months ago and only in the right hand and no where else.  It is at rest and not with use.  It has increased over 6 months.  No family history of tremor  07/01/14 update:  Pt is f/u today, accompanied by her son and medical translator.  Pt is now on carbidopa/levodopa 25/100 tid.  She has taking it right at the mealtime.  She is doing better in terms of tremor.  It will awaken her up from sleep, however.  She went to PT at [facility] and did well.  No falls.  No lightheadedness, except minor maybe one time a month.  Denies hallucinations.  She is having vivid dreams but not acting them out.  She is not choking on her food.  She denies any side effects with the levodopa.  Her son notices that her facial expression is much improved.  12/22/14 update:  The patient is following up today, accompanied by her son  who supplements the history.  A medical translator was scheduled, but did not show up today and her son asked [place] to continue (in fact, asked [place] not to schedule one again although I told him that we really needed to have one present per our policy).  The patient is on carbidopa/levodopa, 25/100, one tablet 3 times per day in addition to carbidopa/levodopa 50/200 which was added at bedtime last visit.   She thinks that definitely helped her sleep.  She has some tremor but it is not bothersome.   The patient reports that she is doing well.  No falls since last visit.  No hallucinations.  Some lightheadedness in the AM (not frequent) but no near syncope.  Has been doing well in terms of swallowing.  04/26/15 update:  The patient is following up today, accompanied by her son  who supplements the history.   She is having some tremor in the R hand but it is not very bothersome.  Never tremor at other times of day.  She cannot state if tremor happens more as med wears off.   carbidopa/levodopa 25/100 is given at 8am/noon and 6pm and then takes the carbidopa/levodopa 50/200 q hs.  No hallucinations.  Rare lightheaded.  No falls.  Walks for exercise.    10/10/15 update:   The patient follows up today, complete by her son who supplements the history.  She remains on carbidopa/levodopa 25/100, one tablet 3 times per day in addition to carbidopa/levodopa 50/200 at night.  She continues to walk some for exercise, but otherwise gets no cardiovascular exercise.  No hallucinations.  Had a fall last month when staying with her other son in [place] but this son doesn't know details of it and pt doesn't remember.    Her swallowing is usually good but has some occasional trouble with food.  Sleeping well.  04/15/16 update:  Patient follows up today, accompanied by her son who supplements the history.  I increased her carbidopa/levodopa 25/100 last visit so that she was taking 2 tablets in the morning, one in the afternoon and 2 in the evening in addition to her carbidopa/levodopa 50/200 CR at night.  Overall, she has been doing well.  She  denies any lightheadedness or near syncope.  No falls since our last visit.  Mood has been good.  Her son called back in December to state that the patient was having intermittent episodes of slurred speech.  I ordered an MRI of the brain, but it was not done (not scheduled by the family).  Son states that it is better and she was in [place], [place] at the time.  No lateralizing weakness or paresthesias at the time.    CT of the brain was performed in 2011.  I reviewed these films.  This was nonacute with the exception of small vessel disease.  PREVIOUS MEDICATIONS: none to date  ALLERGIES:   Allergies  Allergen Reactions  . Sulfa Antibiotics     Angioedema of lips , throat    CURRENT  MEDICATIONS:  Outpatient Encounter Prescriptions as of 04/15/2016  Medication Sig  . acetaminophen (TYLENOL) 325 MG tablet Take 650 mg by mouth every 6 (six) hours as needed.  Marland Kitchen alendronate (FOSAMAX) 70 MG tablet Take 70 mg by mouth every 7 (seven) days. Take with a full glass of water on an empty stomach.  On Wednesday  . carbidopa-levodopa (SINEMET CR) 50-200 MG tablet Take 1 tablet by mouth at bedtime.  . carbidopa-levodopa (SINEMET IR) 25-100 MG tablet 2 in the AM, 1 at noon, 2 in the evening  . diclofenac sodium (VOLTAREN) 1 % GEL Apply topically 4 (four) times daily.  . furosemide (LASIX) 20 MG tablet Take 20 mg by mouth daily as needed.  Marland Kitchen HYDROcodone-acetaminophen (NORCO) 10-325 MG per tablet Take 1-2 tablets by mouth every 6 (six) hours as needed for moderate pain or severe pain.  . metoprolol tartrate (LOPRESSOR) 25 MG tablet Take 25 mg by mouth 2 (two) times daily.  Marland Kitchen omeprazole (PRILOSEC) 20 MG capsule Take 20 mg by mouth daily.  . rosuvastatin (CRESTOR) 5 MG tablet Take 5 mg by mouth daily.  . [DISCONTINUED] carbidopa-levodopa (SINEMET IR) 25-100 MG tablet 2 in the AM, 1 before lunch, and 1 before dinner   No facility-administered encounter medications on file as of 04/15/2016.     PAST MEDICAL HISTORY:   Past Medical History:  Diagnosis Date  . AAA (abdominal aortic aneurysm) ([place])   . Arthritis   . Barrett's esophagus   . Blood dyscrasia    leukemia cll  . GERD (gastroesophageal reflux disease)   . History of recurrent UTIs   . Hypercholesterolemia   . Hypertension   . Leukemia ([facility] [facility])    CLL observation  dr Inda Merlin cone  . Osteoporosis   . Vitamin D deficiency     PAST SURGICAL HISTORY:   Past Surgical History:  Procedure Laterality Date  . carpel tunnel-left    . CATARACT EXTRACTION  bilateral  . TOTAL KNEE ARTHROPLASTY Left 06/23/2012   Procedure: TOTAL KNEE ARTHROPLASTY;  Surgeon: Hessie Dibble, MD;  Location: [facility];  Service: Orthopedics;  Laterality: Left;   DEPUY, RNFA  . TOTAL KNEE ARTHROPLASTY Right 08/10/2013   Procedure: TOTAL KNEE ARTHROPLASTY;  Surgeon: Hessie Dibble, MD;  Location: [facility];  Service: Orthopedics;  Laterality: Right;    SOCIAL HISTORY:   Social History   Social History  . Marital status: Widowed    Spouse name: N/A  . Number of children: N/A  . Years of education: N/A   Occupational History  . Not on file.   Social History Main Topics  . Smoking status: Never Smoker  . Smokeless tobacco: Never Used  .  Alcohol use No  . Drug use: No  . Sexual activity: Not on file   Other Topics Concern  . Not on file   Social History Narrative  . No narrative on file    FAMILY HISTORY:   Family Status  Relation Status  . Mother Deceased   cancer (unknown kind)  . Father Deceased   asthma  . Sister Deceased   asthma  . Brother Deceased   throat cancer  . Brother Deceased   asthma  . Son Alive   healthy  . Son Alive   healthy  . Daughter Alive   rheumatoid arthritis, thyroid disease  . Daughter Alive   healthy    ROS:  A complete 10 system review of systems was obtained and was unremarkable apart from what is mentioned above.  PHYSICAL EXAMINATION:    VITALS:   Vitals:   04/15/16 0931  BP: 110/60  Pulse: 62  SpO2: 96%  Weight: 137 lb (62.1 kg)  Height: 4\' 11"  (1.499 m)    GEN:  The patient appears stated age and is in NAD. HEENT:  Normocephalic, atraumatic.  The mucous membranes are moist. The superficial temporal arteries are without ropiness or tenderness. CV:  RRR Lungs:  CTAB Neck/HEME:  There are no carotid bruits bilaterally.  Neurological examination:  Orientation: difficult to assess given language barrier.  Is very alert and responsive to commands.   Cranial nerves: There is good facial symmetry with the exception of the fact that the right palpebral fissure is smaller than the left. The visual fields are full to confrontational testing. The speech appears fluent and clear, but  she does not speak my language. Soft palate rises symmetrically and there is no tongue deviation. Hearing is intact to conversational tone. Sensation: Sensation is intact to light touch throughout. Motor: Strength is 5/5 throughout.   Movement examination: Tone: Had significant difficulty assessing this today as previous visits as pt doesn't understand that I want her to relax but think that there is mod increased tone in RUE Abnormal movements: There is a mild right upper extremity resting tremor.    I saw no left upper extremity resting tremor today, which was seen previous.  No dyskinesia Coordination:  There is no significant decremation with RAMs although she is a bit slow Gait and Station: The patient has some difficulty arising out of a deep-seated chair without the use of the hands.   The patient's stride length is decreased.   She shuffles slightly and is slightly unstable.  She does drag the R leg.   She does not turn en bloc.  She is using her cane today.  Labs:  I reviewed labs from 03/21/2014 from her primary care physician.  Her TSH was 2.50.  Her B12 was slightly low at 274.  RPR was negative.  Her AST was 15, ALT 9, alkaline phosphatase 83.    ASSESSMENT/PLAN:  1.   Idiopathic Parkinson's disease, newly diagnosed on 04/08/2014.  The patient has tremor, bradykinesia, rigidity and postural instability.  -Increase carbidopa/levodopa 25/100, 2/1/2.    -We continue carbidopa/levodopa 50/200 at night.  -talked again about PT.  Want to ask their neighbor who is a PT that speaks her language.  They were going to do this last visit but didn't really happen.  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  If not, son states that he will let us know and we  can send someone else to the home.  -son asks about loss of taste.  Likely is due to PD.  Talked about eating more spicy food    -talked to son about safety in bathroom.  May need walker.   Talked about grab bars which she has in her bathroom near the toilet but not in shower.  -Encouraged her to continue to exercise. 2.  I will plan on seeing her back in the next 4-5 months, sooner should new neurologic issues arise.   Much greater than 50% of this visit was spent in counseling and coordinating care.  Total face to face time:  25 min

## 2016-04-11 ENCOUNTER — Ambulatory Visit: Payer: Medicare Other | Admitting: Neurology

## 2016-04-15 ENCOUNTER — Encounter: Payer: Self-pay | Admitting: Neurology

## 2016-04-15 ENCOUNTER — Ambulatory Visit (INDEPENDENT_AMBULATORY_CARE_PROVIDER_SITE_OTHER): Payer: Medicare Other | Admitting: Neurology

## 2016-04-15 VITALS — BP 110/60 | HR 62 | Ht 59.0 in | Wt 137.0 lb

## 2016-04-15 DIAGNOSIS — G20 Parkinson's disease: Secondary | ICD-10-CM

## 2016-04-15 MED ORDER — CARBIDOPA-LEVODOPA ER 50-200 MG PO TBCR: 1.0000 | EXTENDED_RELEASE_TABLET | Freq: Every day | ORAL | 1 refills | Status: DC

## 2016-04-15 MED ORDER — CARBIDOPA-LEVODOPA 25-100 MG PO TABS: ORAL_TABLET | ORAL | 1 refills | Status: DC

## 2016-04-15 NOTE — Addendum Note (Signed)
Addended byAnnamaria Helling on: 04/15/2016 10:13 AM   Modules accepted: Orders

## 2016-04-30 ENCOUNTER — Other Ambulatory Visit: Payer: Self-pay | Admitting: Family Medicine

## 2016-04-30 DIAGNOSIS — Z1231 Encounter for screening mammogram for malignant neoplasm of breast: Secondary | ICD-10-CM

## 2016-05-01 ENCOUNTER — Other Ambulatory Visit: Payer: Self-pay | Admitting: Family Medicine

## 2016-05-01 DIAGNOSIS — E2839 Other primary ovarian failure: Secondary | ICD-10-CM

## 2016-05-01 DIAGNOSIS — R5381 Other malaise: Secondary | ICD-10-CM

## 2016-05-10 ENCOUNTER — Other Ambulatory Visit: Payer: Self-pay | Admitting: Neurology

## 2016-06-19 ENCOUNTER — Other Ambulatory Visit: Payer: Medicare Other

## 2016-06-19 ENCOUNTER — Ambulatory Visit: Payer: Medicare Other

## 2016-08-14 ENCOUNTER — Ambulatory Visit
Admission: RE | Admit: 2016-08-14 | Discharge: 2016-08-14 | Disposition: A | Discharge status: Home / Self Care | Payer: Medicare Other | Source: Ambulatory Visit | Attending: Family Medicine | Admitting: Family Medicine

## 2016-08-14 DIAGNOSIS — Z1231 Encounter for screening mammogram for malignant neoplasm of breast: Secondary | ICD-10-CM

## 2016-08-14 DIAGNOSIS — E2839 Other primary ovarian failure: Secondary | ICD-10-CM

## 2016-10-16 ENCOUNTER — Ambulatory Visit: Payer: Medicare Other | Admitting: Neurology

## 2016-11-05 ENCOUNTER — Ambulatory Visit
Admission: RE | Admit: 2016-11-05 | Discharge: 2016-11-05 | Disposition: A | Discharge status: Home / Self Care | Payer: Medicare Other | Source: Ambulatory Visit | Attending: Family Medicine | Admitting: Family Medicine

## 2016-11-05 ENCOUNTER — Other Ambulatory Visit: Payer: Self-pay | Admitting: Family Medicine

## 2016-11-05 DIAGNOSIS — R509 Fever, unspecified: Secondary | ICD-10-CM

## 2016-11-05 IMAGING — CR DG CHEST 2V
2 series · 2 of 2 positions shown · non-contrast
Comparison: Chest x-ray dated [DATE].

CLINICAL DATA: Fever, shortness of breath, and left-sided chest
discomfort. Nonsmoker.

EXAM:
CHEST  2 VIEW

[w chest pa]
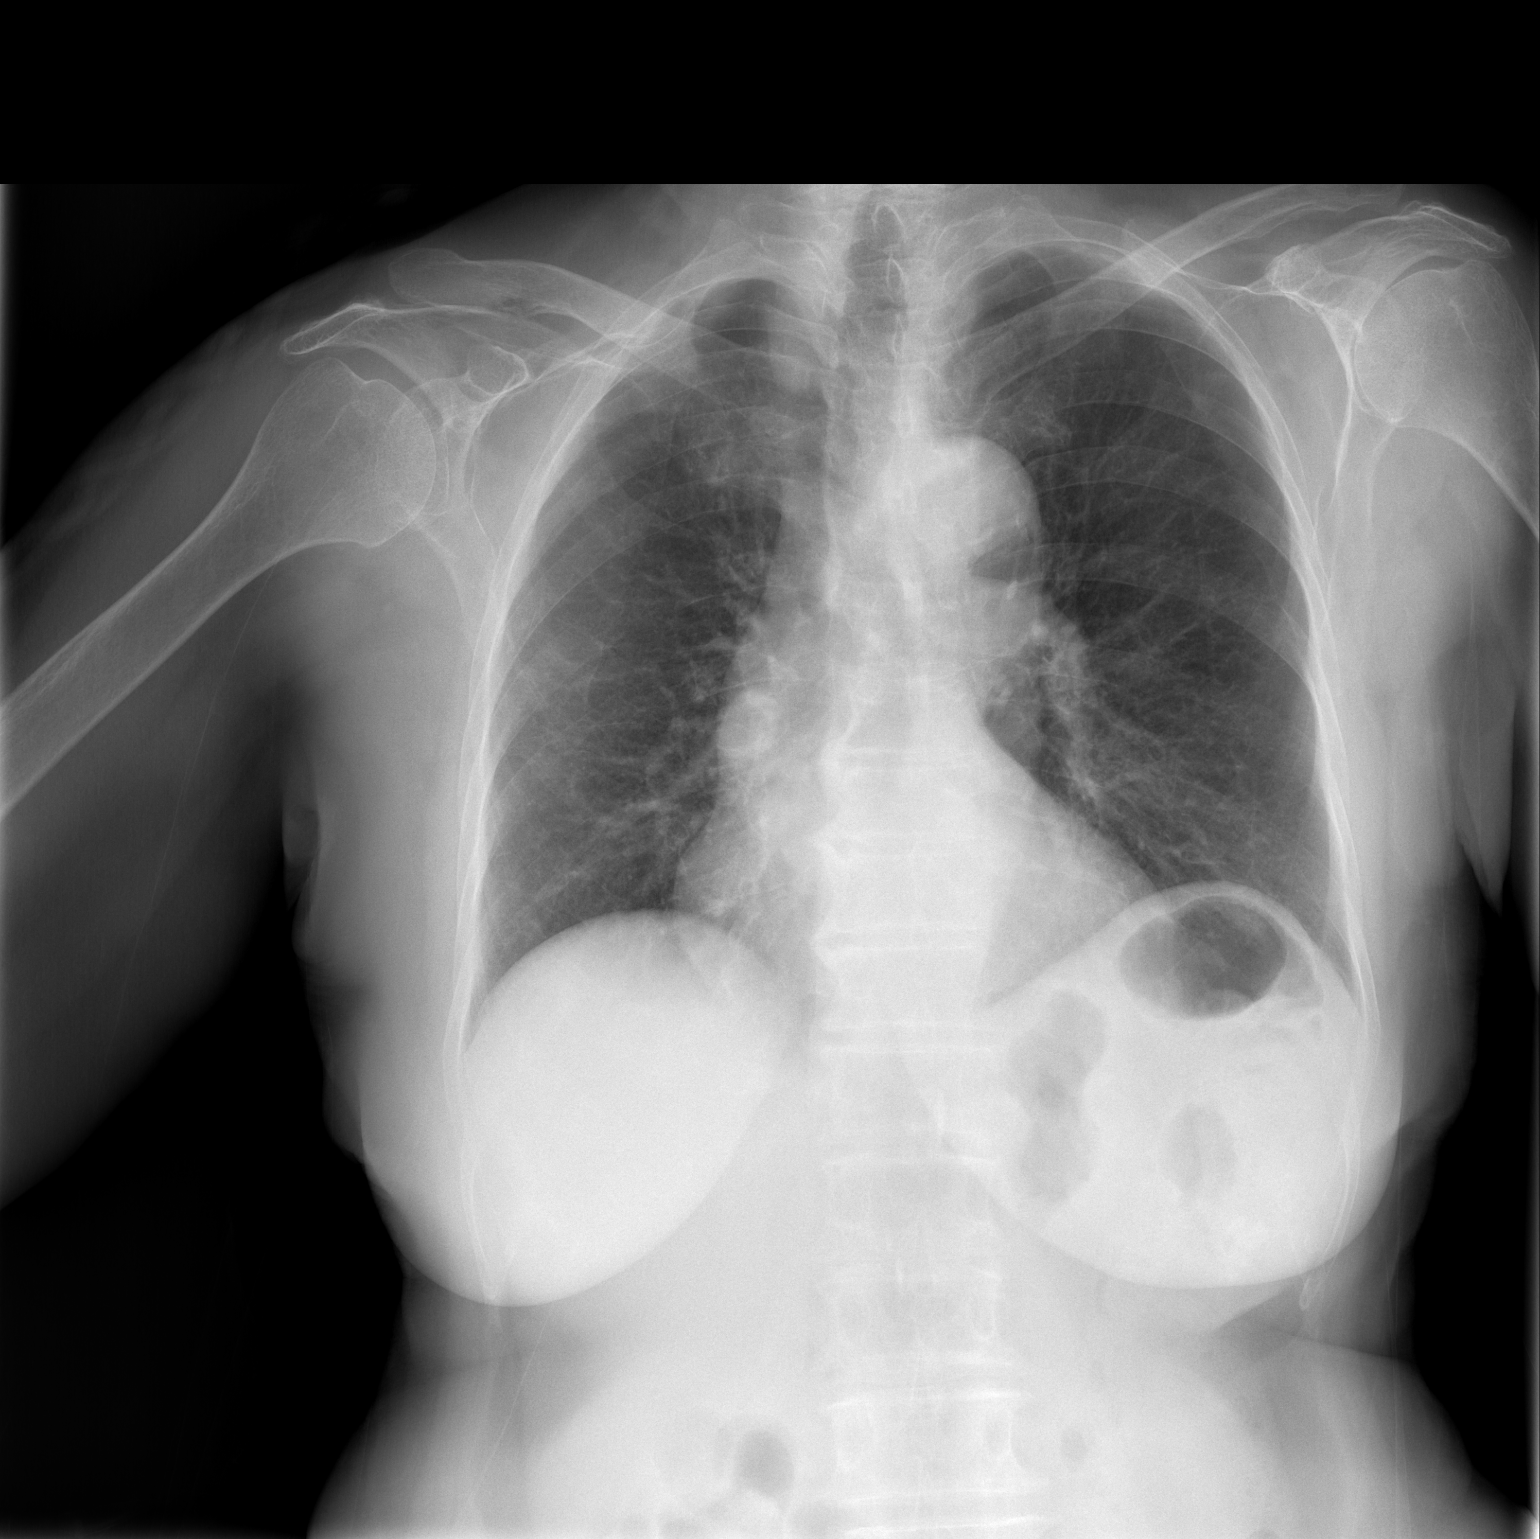

[w chest lat]
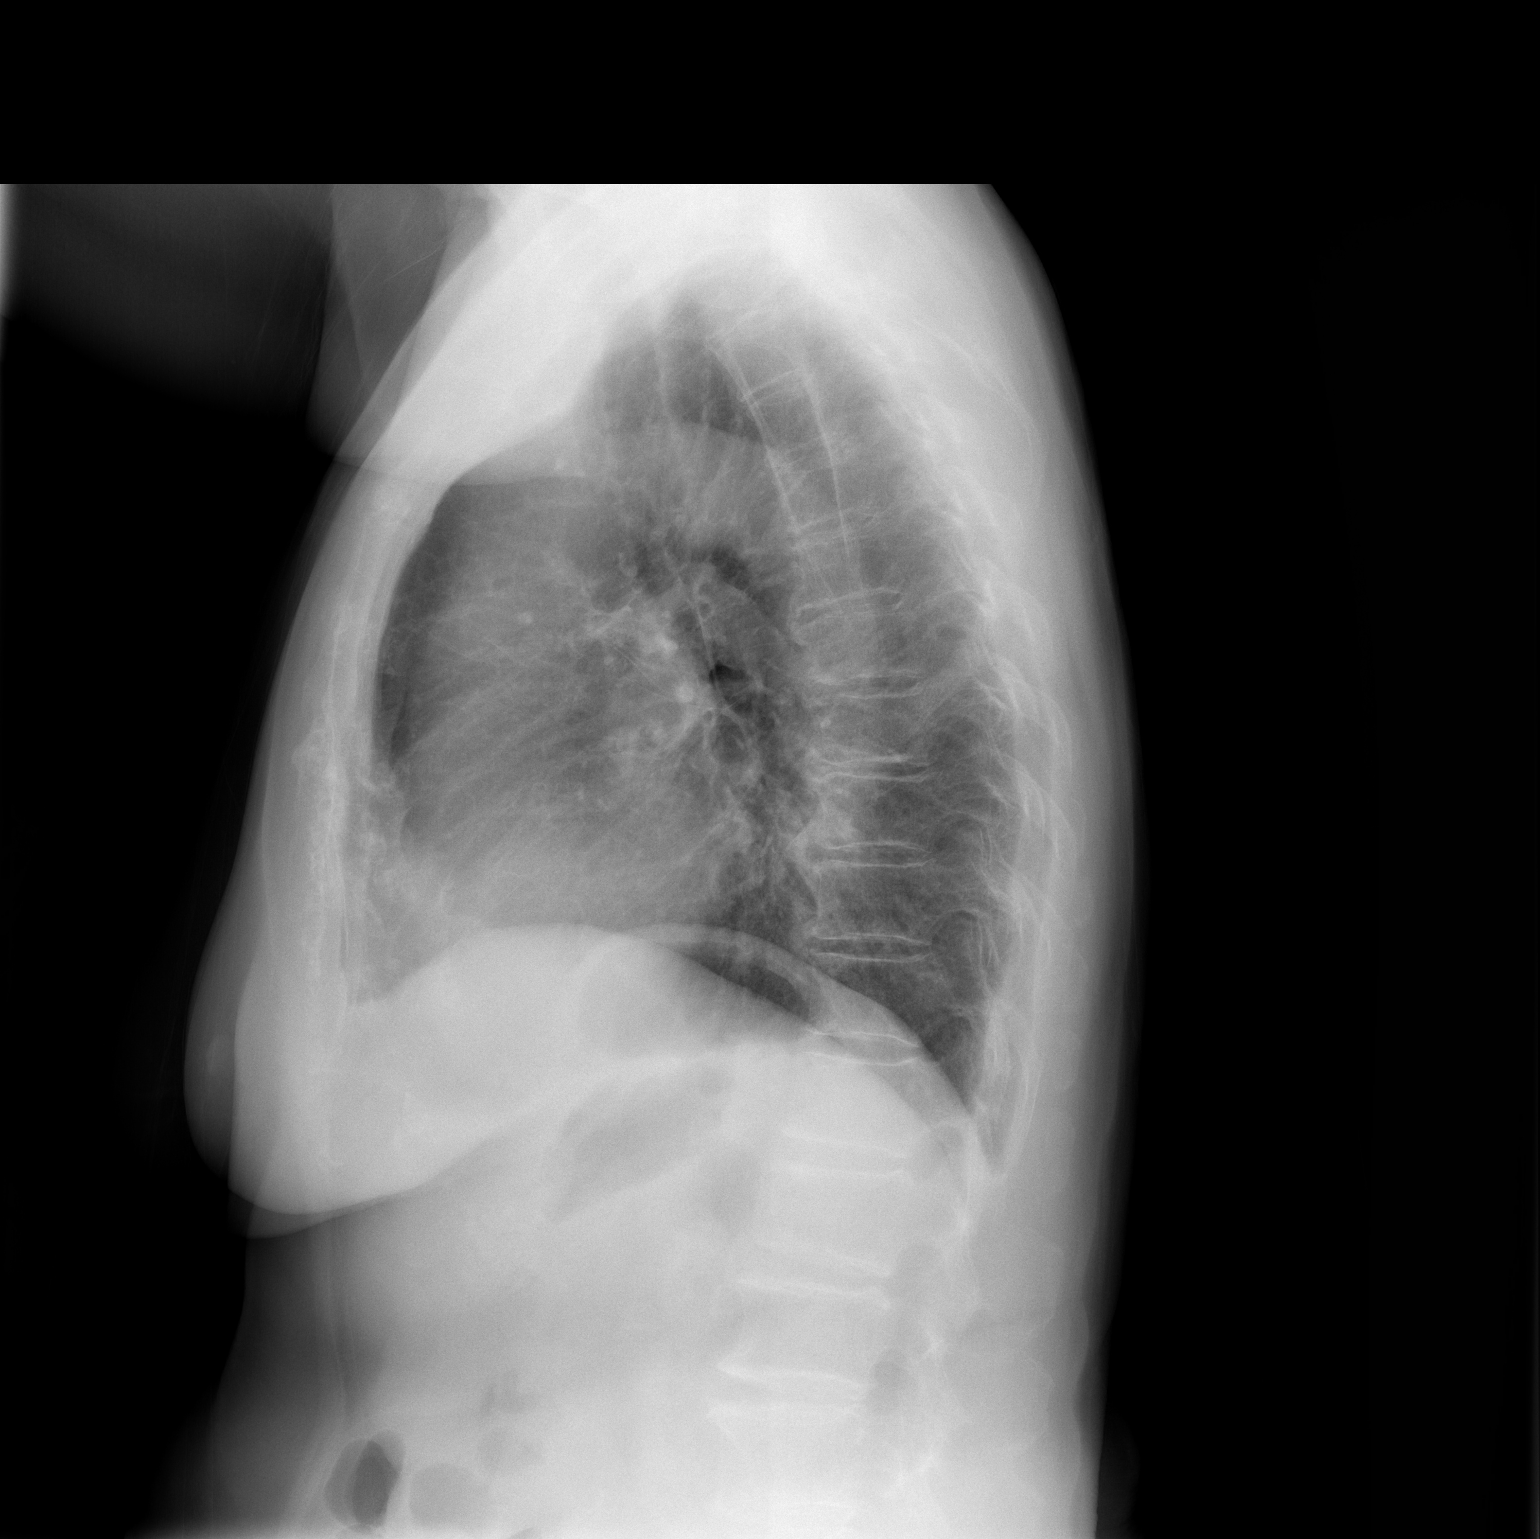

[2 of 2 positions shown; findings below may reference images not displayed]

FINDINGS: The lungs are adequately inflated and clear. The heart and pulmonary
vascularity are normal. The mediastinum is normal in width. There is
calcification in the wall of the aortic arch. The trachea is
midline. There is no pleural effusion. There is mild multilevel
degenerative disc disease of the thoracic spine.
IMPRESSION: There is no acute cardiopulmonary abnormality.

Thoracic aortic atherosclerosis.

## 2016-11-12 ENCOUNTER — Other Ambulatory Visit: Payer: Self-pay | Admitting: Neurology

## 2017-01-10 ENCOUNTER — Ambulatory Visit: Payer: Medicare Other | Admitting: Neurology

## 2017-01-21 NOTE — Progress Notes (Signed)
Katherine Novak was seen today in the movement disorders clinic for neurologic consultation at the request of Katherine Jordan, MD.  The consultation is for the evaluation of tremor.  Pt is accompanied by her son who supplements the history.  A medical translator is also present.  Pt states that it started 6 months ago and only in the right hand and no where else.  It is at rest and not with use.  It has increased over 6 months.  No family history of tremor  07/01/14 update:  Pt is f/u today, accompanied by her son and medical translator.  Pt is now on carbidopa/levodopa 25/100 tid.  She has taking it right at the mealtime.  She is doing better in terms of tremor.  It will awaken her up from sleep, however.  She went to PT at [facility] and did well.  No falls.  No lightheadedness, except minor maybe one time a month.  Denies hallucinations.  She is having vivid dreams but not acting them out.  She is not choking on her food.  She denies any side effects with the levodopa.  Her son notices that her facial expression is much improved.  12/22/14 update:  The patient is following up today, accompanied by her son  who supplements the history.  A medical translator was scheduled, but did not show up today and her son asked [place] to continue (in fact, asked [place] not to schedule one again although I told him that we really needed to have one present per our policy).  The patient is on carbidopa/levodopa, 25/100, one tablet 3 times per day in addition to carbidopa/levodopa 50/200 which was added at bedtime last visit.   She thinks that definitely helped her sleep.  She has some tremor but it is not bothersome.   The patient reports that she is doing well.  No falls since last visit.  No hallucinations.  Some lightheadedness in the AM (not frequent) but no near syncope.  Has been doing well in terms of swallowing.  04/26/15 update:  The patient is following up today, accompanied by her son  who supplements the history.   She is having some tremor in the R hand but it is not very bothersome.  Never tremor at other times of day.  She cannot state if tremor happens more as med wears off.   carbidopa/levodopa 25/100 is given at 8am/noon and 6pm and then takes the carbidopa/levodopa 50/200 q hs.  No hallucinations.  Rare lightheaded.  No falls.  Walks for exercise.    10/10/15 update:   The patient follows up today, complete by her son who supplements the history.  She remains on carbidopa/levodopa 25/100, one tablet 3 times per day in addition to carbidopa/levodopa 50/200 at night.  She continues to walk some for exercise, but otherwise gets no cardiovascular exercise.  No hallucinations.  Had a fall last month when staying with her other son in [place] but this son doesn't know details of it and pt doesn't remember.    Her swallowing is usually good but has some occasional trouble with food.  Sleeping well.  04/15/16 update:  Patient follows up today, accompanied by her son who supplements the history.  I increased her carbidopa/levodopa 25/100 last visit so that she was taking 2 tablets in the morning, one in the afternoon and 2 in the evening in addition to her carbidopa/levodopa 50/200 CR at night.  Overall, she has been doing well.  She  denies any lightheadedness or near syncope.  No falls since our last visit.  Mood has been good.  Her son called back in December to state that the patient was having intermittent episodes of slurred speech.  I ordered an MRI of the brain, but it was not done (not scheduled by the family).  Son states that it is better and she was in [place], [place] at the time.  No lateralizing weakness or paresthesias at the time.    01/22/17 update:  Pt seen in f/u.  This patient is accompanied in the office by her son who supplements the history.  Supposed to be on carbidopa/levodopa 25/100, 2/1/2 but she dropped the middle of the day dose accidentally.  She is still on carbidopa/levodopa 50/200 q hs.  Tremor  will wake her up and keep her up in middle of the night.   Pt denies falls.  No SE with medications.   Pt denies lightheadedness, near syncope.  No hallucinations.  Mood has been good.  No new medical problems.  Some trouble with swallowing pills - does okay with levodopa but has some trouble with others.    CT of the brain was performed in 2011.  I reviewed these films.  This was nonacute with the exception of small vessel disease.  PREVIOUS MEDICATIONS: none to date  ALLERGIES:   Allergies  Allergen Reactions  . Sulfa Antibiotics     Angioedema of lips , throat    CURRENT MEDICATIONS:  Outpatient Encounter Medications as of 01/22/2017  Medication Sig  . acetaminophen (TYLENOL) 325 MG tablet Take 650 mg by mouth every 6 (six) hours as needed.  Marland Kitchen alendronate (FOSAMAX) 70 MG tablet Take 70 mg by mouth every 7 (seven) days. Take with a full glass of water on an empty stomach.  On Wednesday  . carbidopa-levodopa (SINEMET CR) 50-200 MG tablet TAKE 1 TABLET BY MOUTH AT BEDTIME.  . carbidopa-levodopa (SINEMET IR) 25-100 MG tablet Take 2 in the morning, 1 before lunch, 2 before dinner  . diclofenac sodium (VOLTAREN) 1 % GEL Apply topically 4 (four) times daily.  . furosemide (LASIX) 20 MG tablet Take 20 mg by mouth daily as needed.  Marland Kitchen HYDROcodone-acetaminophen (NORCO) 10-325 MG per tablet Take 1-2 tablets by mouth every 6 (six) hours as needed for moderate pain or severe pain.  . metoprolol tartrate (LOPRESSOR) 25 MG tablet Take 25 mg by mouth 2 (two) times daily.  Marland Kitchen omeprazole (PRILOSEC) 20 MG capsule Take 20 mg by mouth daily.  . rosuvastatin (CRESTOR) 5 MG tablet Take 5 mg by mouth daily.  . [DISCONTINUED] carbidopa-levodopa (SINEMET IR) 25-100 MG tablet 2 in the AM, 1 at noon, 2 in the evening   No facility-administered encounter medications on file as of 01/22/2017.     PAST MEDICAL HISTORY:   Past Medical History:  Diagnosis Date  . AAA (abdominal aortic aneurysm) ([facility])   . Arthritis    . Barrett's esophagus   . Blood dyscrasia    leukemia cll  . GERD (gastroesophageal reflux disease)   . History of recurrent UTIs   . Hypercholesterolemia   . Hypertension   . Leukemia ([facility])    CLL observation  dr Inda Merlin cone  . Osteoporosis   . Vitamin D deficiency     PAST SURGICAL HISTORY:   Past Surgical History:  Procedure Laterality Date  . carpel tunnel-left    . CATARACT EXTRACTION  bilateral  . TOTAL KNEE ARTHROPLASTY Left 06/23/2012   Procedure: TOTAL KNEE  ARTHROPLASTY;  Surgeon: Hessie Dibble, MD;  Location: [facility];  Service: Orthopedics;  Laterality: Left;  DEPUY, RNFA  . TOTAL KNEE ARTHROPLASTY Right 08/10/2013   Procedure: TOTAL KNEE ARTHROPLASTY;  Surgeon: Hessie Dibble, MD;  Location: [facility];  Service: Orthopedics;  Laterality: Right;    SOCIAL HISTORY:   Social History   Socioeconomic History  . Marital status: Widowed    Spouse name: Not on file  . Number of children: Not on file  . Years of education: Not on file  . Highest education level: Not on file  Social Needs  . Financial resource strain: Not on file  . Food insecurity - worry: Not on file  . Food insecurity - inability: Not on file  . Transportation needs - medical: Not on file  . Transportation needs - non-medical: Not on file  Occupational History  . Not on file  Tobacco Use  . Smoking status: Never Smoker  . Smokeless tobacco: Never Used  Substance and Sexual Activity  . Alcohol use: No  . Drug use: No  . Sexual activity: Not on file  Other Topics Concern  . Not on file  Social History Narrative  . Not on file    FAMILY HISTORY:   Family Status  Relation Name Status  . Mother  Deceased       cancer (unknown kind)  . Father  Deceased       asthma  . Sister  Deceased       asthma  . Brother  Deceased       throat cancer  . Brother  Deceased       asthma  . Son  Alive       healthy  . Son  Alive       healthy  . Daughter  Alive       rheumatoid arthritis, thyroid  disease  . Daughter  Alive       healthy    ROS:  A complete 10 system review of systems was obtained and was unremarkable apart from what is mentioned above.  PHYSICAL EXAMINATION:    VITALS:   Vitals:   01/22/17 0919  BP: 136/76  Pulse: 80  SpO2: 96%  Weight: 139 lb (63 kg)  Height: 4\' 11"  (1.499 m)    GEN:  The patient appears stated age and is in NAD. HEENT:  Normocephalic, atraumatic.  The mucous membranes are moist. The superficial temporal arteries are without ropiness or tenderness. CV:  RRR Lungs:  CTAB Neck/HEME:  There are no carotid bruits bilaterally.  Neurological examination:  Orientation: difficult to assess given language barrier.  Is very alert and responsive to commands.   Cranial nerves: There is good facial symmetry with the exception of the fact that the right palpebral fissure is smaller than the left. The visual fields are full to confrontational testing. The speech appears fluent and clear, but she does not speak my language. Soft palate rises symmetrically and there is no tongue deviation. Hearing is intact to conversational tone. Sensation: Sensation is intact to light touch throughout. Motor: Strength is 5/5 throughout.   Movement examination: Tone: seems to have mild increased tone but difficulty relaxiing Abnormal movements: There is a mild intermittent RUE resting tremor Coordination:  There is no significant decremation with RAMs although she is a bit slow Gait and Station: The patient has some difficulty arising out of a deep-seated chair without the use of the hands.   The patient's stride  length is decreased.   She shuffles slightly and is slightly unstable.  She drags the right leg and is unsteady.  When given a walker, she is much better and much more stable.    Labs:  Lab work was reviewed from her primary care physician dated November 05, 2016.  White blood cells were 6.2, hemoglobin 12.2, hematocrit 37.9 and platelets 140.  Sodium was  139, potassium 3.9, chloride 103, CO2 29, BUN 9, creatinine 0.57.  AST 16, ALT 14, alkaline phosphatase 78.  Sedimentation rate was 12.  ASSESSMENT/PLAN:  1.   Idiopathic Parkinson's disease, newly diagnosed on 04/08/2014.  The patient has tremor, bradykinesia, rigidity and postural instability.  -she is forgetting middle of the day carbidopa/levodopa 25/100.  Instructed to add that back and take 2/1/2.  Also if she wakes up in middle of the night with tremor can take 1/2 tablet as needed.  Could use monitoring of medication  -We will continue carbidopa/levodopa 50/200 at night.  -RX given for walker.  Told to use at all times  -son asks about other medications.  Talked to him about fact that most other medications in her age group will contribute to confusion/hallucinations  -son asks about loss of taste.  Likely is due to PD.  Talked about eating more spicy food    -Encouraged her to continue to exercise. 2.  Follow up is anticipated in the next 5-6 months, sooner should new neurologic issues arise.  Much greater than 50% of this visit was spent in counseling and coordinating care.  Total face to face time:  25 min

## 2017-01-22 ENCOUNTER — Ambulatory Visit (INDEPENDENT_AMBULATORY_CARE_PROVIDER_SITE_OTHER): Payer: Medicare Other | Admitting: Neurology

## 2017-01-22 ENCOUNTER — Encounter: Payer: Self-pay | Admitting: Neurology

## 2017-01-22 VITALS — BP 136/76 | HR 80 | Ht 59.0 in | Wt 139.0 lb

## 2017-01-22 DIAGNOSIS — G20 Parkinson's disease: Secondary | ICD-10-CM | POA: Diagnosis not present

## 2017-01-22 MED ORDER — CARBIDOPA-LEVODOPA 25-100 MG PO TABS: ORAL_TABLET | ORAL | 3 refills | Status: DC

## 2017-01-22 MED ORDER — CARBIDOPA-LEVODOPA ER 50-200 MG PO TBCR: 1.0000 | EXTENDED_RELEASE_TABLET | Freq: Every day | ORAL | 3 refills | Status: DC

## 2017-01-22 MED ORDER — WALKER MISC: 1.0000 | Freq: Every day | 0 refills | Status: DC

## 2017-01-22 NOTE — Patient Instructions (Signed)
1. Prescription for walker given.   2. Refills of Levodopa sent to your pharmacy. Make sure you take the middle of the day dose!

## 2017-03-10 ENCOUNTER — Telehealth: Payer: Self-pay | Admitting: Neurology

## 2017-03-10 NOTE — Telephone Encounter (Signed)
Spoke with son - he would like a walker with 4 wheels instead of 2 wheels on front and tennis balls in back (written RX at visit in January). He states she walks better with the wheels. Please advise if okay to write.

## 2017-03-10 NOTE — Telephone Encounter (Signed)
Pt's son called and said he needs a prescription for a walker for pt, please call and advise

## 2017-03-11 MED ORDER — ROLLATOR MISC: 1.0000 | Freq: Every day | 0 refills | Status: DC

## 2017-03-11 NOTE — Telephone Encounter (Signed)
RX written 

## 2017-03-11 NOTE — Telephone Encounter (Signed)
Icard for rollator

## 2017-03-11 NOTE — Telephone Encounter (Signed)
LMOM letting patient's son know RX at front for pick up.

## 2017-05-25 ENCOUNTER — Other Ambulatory Visit: Payer: Self-pay | Admitting: Neurology

## 2017-12-02 NOTE — Progress Notes (Signed)
Katherine Novak was seen today in the movement disorders clinic for neurologic consultation at the request of [name], MD.  The consultation is for the evaluation of tremor.  Pt is accompanied by her son who supplements the history.  A medical translator is also present.  Pt states that it started 6 months ago and only in the right hand and no where else.  It is at rest and not with use.  It has increased over 6 months.  No family history of tremor  07/01/14 update:  Pt is f/u today, accompanied by her son and medical translator.  Pt is now on carbidopa/levodopa 25/100 tid.  She has taking it right at the mealtime.  She is doing better in terms of tremor.  It will awaken her up from sleep, however.  She went to PT at [facility] and did well.  No falls.  No lightheadedness, except minor maybe one time a month.  Denies hallucinations.  She is having vivid dreams but not acting them out.  She is not choking on her food.  She denies any side effects with the levodopa.  Her son notices that her facial expression is much improved.  12/22/14 update:  The patient is following up today, accompanied by her son  who supplements the history.  A medical translator was scheduled, but did not show up today and her son asked [place] to continue (in fact, asked [place] not to schedule one again although I told him that we really needed to have one present per our policy).  The patient is on carbidopa/levodopa, 25/100, one tablet 3 times per day in addition to carbidopa/levodopa 50/200 which was added at bedtime last visit.   She thinks that definitely helped her sleep.  She has some tremor but it is not bothersome.   The patient reports that she is doing well.  No falls since last visit.  No hallucinations.  Some lightheadedness in the AM (not frequent) but no near syncope.  Has been doing well in terms of swallowing.  04/26/15 update:  The patient is following up today, accompanied by her son  who supplements the history.   She is having some tremor in the R hand but it is not very bothersome.  Never tremor at other times of day.  She cannot state if tremor happens more as med wears off.   carbidopa/levodopa 25/100 is given at 8am/noon and 6pm and then takes the carbidopa/levodopa 50/200 q hs.  No hallucinations.  Rare lightheaded.  No falls.  Walks for exercise.    10/10/15 update:   The patient follows up today, complete by her son who supplements the history.  She remains on carbidopa/levodopa 25/100, one tablet 3 times per day in addition to carbidopa/levodopa 50/200 at night.  She continues to walk some for exercise, but otherwise gets no cardiovascular exercise.  No hallucinations.  Had a fall last month when staying with her other son in [place] but this son doesn't know details of it and pt doesn't remember.    Her swallowing is usually good but has some occasional trouble with food.  Sleeping well.  04/15/16 update:  Patient follows up today, accompanied by her son who supplements the history.  I increased her carbidopa/levodopa 25/100 last visit so that she was taking 2 tablets in the morning, one in the afternoon and 2 in the evening in addition to her carbidopa/levodopa 50/200 CR at night.  Overall, she has been doing well.  She  denies any lightheadedness or near syncope.  No falls since our last visit.  Mood has been good.  Her son called back in December to state that the patient was having intermittent episodes of slurred speech.  I ordered an MRI of the brain, but it was not done (not scheduled by the family).  Son states that it is better and she was in [place], [place] at the time.  No lateralizing weakness or paresthesias at the time.    01/22/17 update:  Pt seen in f/u.  This patient is accompanied in the office by her son who supplements the history.  Supposed to be on carbidopa/levodopa 25/100, 2/1/2 but she dropped the middle of the day dose accidentally.  She is still on carbidopa/levodopa 50/200 q hs.  Tremor  will wake her up and keep her up in middle of the night.   Pt denies falls.  No SE with medications.   Pt denies lightheadedness, near syncope.  No hallucinations.  Mood has been good.  No new medical problems.  Some trouble with swallowing pills - does okay with levodopa but has some trouble with others.    12/09/17 update: Patient seen today in follow-up for Parkinson's disease, accompanied by her neighbor who supplements the history.  I also spoke with her son on the telephone.  I have not seen the patient in almost a year.  Last visit, she was forgetting the middle of the day dose of levodopa and I instructed her to restart that and take carbidopa/levodopa 25/100, 2 tablets in the morning(6-7am), 1 in the afternoon (12:30-1pm)  and 2 in the evening (6-7 pm).  She is also on carbidopa/levodopa 50/200 at bedtime.  She has had increased tremor in the R hand.  She is noting cramping of the R foot after a shower (9-10 am).  She was given a walker last visit.  She is not using that.  Her neighbor, who is a physical therapist, has been encouraging her to do so but she has refused thus far.  She reports that she has had no falls.  No lightheadedness or near syncope.  No new medical problems since our last visit.  CT of the brain was performed in 2011.  I reviewed these films.  This was nonacute with the exception of small vessel disease.  PREVIOUS MEDICATIONS: none to date  ALLERGIES:   Allergies  Allergen Reactions  . Sulfa Antibiotics     Angioedema of lips , throat    CURRENT MEDICATIONS:  Outpatient Encounter Medications as of 12/08/2017  Medication Sig  . acetaminophen (TYLENOL) 325 MG tablet Take 650 mg by mouth every 6 (six) hours as needed.  Marland Kitchen alendronate (FOSAMAX) 70 MG tablet Take 70 mg by mouth every 7 (seven) days. Take with a full glass of water on an empty stomach.  On Wednesday  . carbidopa-levodopa (SINEMET CR) 50-200 MG tablet Take 1 tablet by mouth at bedtime.  . carbidopa-levodopa  (SINEMET IR) 25-100 MG tablet TAKE 2 TABLETS IN THE MORNING, 1 TABLET AT NOON, 2 TABLETS IN THE EVENING  . diclofenac sodium (VOLTAREN) 1 % GEL Apply topically 4 (four) times daily.  . furosemide (LASIX) 20 MG tablet Take 20 mg by mouth daily as needed.  Marland Kitchen HYDROcodone-acetaminophen (NORCO) 10-325 MG per tablet Take 1-2 tablets by mouth every 6 (six) hours as needed for moderate pain or severe pain.  . metoprolol tartrate (LOPRESSOR) 25 MG tablet Take 25 mg by mouth 2 (two) times daily.  Marland Kitchen omeprazole (  PRILOSEC) 20 MG capsule Take 20 mg by mouth daily.  . rosuvastatin (CRESTOR) 5 MG tablet Take 5 mg by mouth daily.  . [DISCONTINUED] carbidopa-levodopa (SINEMET IR) 25-100 MG tablet Take 2 in the morning, 1 before lunch, 2 before dinner  . [DISCONTINUED] Misc. Devices (ROLLATOR) MISC 1 Device by Does not apply route daily.   No facility-administered encounter medications on file as of 12/08/2017.     PAST MEDICAL HISTORY:   Past Medical History:  Diagnosis Date  . AAA (abdominal aortic aneurysm) ([facility])   . Arthritis   . Barrett's esophagus   . Blood dyscrasia    leukemia cll  . GERD (gastroesophageal reflux disease)   . History of recurrent UTIs   . Hypercholesterolemia   . Hypertension   . Leukemia ([facility])    CLL observation  dr Inda Merlin cone  . Osteoporosis   . Vitamin D deficiency     PAST SURGICAL HISTORY:   Past Surgical History:  Procedure Laterality Date  . carpel tunnel-left    . CATARACT EXTRACTION  bilateral  . TOTAL KNEE ARTHROPLASTY Left 06/23/2012   Procedure: TOTAL KNEE ARTHROPLASTY;  Surgeon: Hessie Dibble, MD;  Location: [facility];  Service: Orthopedics;  Laterality: Left;  DEPUY, RNFA  . TOTAL KNEE ARTHROPLASTY Right 08/10/2013   Procedure: TOTAL KNEE ARTHROPLASTY;  Surgeon: Hessie Dibble, MD;  Location: [facility];  Service: Orthopedics;  Laterality: Right;    SOCIAL HISTORY:   Social History   Socioeconomic History  . Marital status: Widowed    Spouse name: Not on  file  . Number of children: Not on file  . Years of education: Not on file  . Highest education level: Not on file  Occupational History  . Not on file  Social Needs  . Financial resource strain: Not on file  . Food insecurity:    Worry: Not on file    Inability: Not on file  . Transportation needs:    Medical: Not on file    Non-medical: Not on file  Tobacco Use  . Smoking status: Never Smoker  . Smokeless tobacco: Never Used  Substance and Sexual Activity  . Alcohol use: No  . Drug use: No  . Sexual activity: Not on file  Lifestyle  . Physical activity:    Days per week: Not on file    Minutes per session: Not on file  . Stress: Not on file  Relationships  . Social connections:    Talks on phone: Not on file    Gets together: Not on file    Attends religious service: Not on file    Active member of club or organization: Not on file    Attends meetings of clubs or organizations: Not on file    Relationship status: Not on file  . Intimate partner violence:    Fear of current or ex partner: Not on file    Emotionally abused: Not on file    Physically abused: Not on file    Forced sexual activity: Not on file  Other Topics Concern  . Not on file  Social History Narrative  . Not on file    FAMILY HISTORY:   Family Status  Relation Name Status  . Mother  Deceased       cancer (unknown kind)  . Father  Deceased       asthma  . Sister  Deceased       asthma  . Brother  Deceased  throat cancer  . Brother  Deceased       asthma  . Son  Alive       healthy  . Son  Alive       healthy  . Daughter  Alive       rheumatoid arthritis, thyroid disease  . Daughter  Alive       healthy    ROS:  Review of Systems  Constitutional: Negative.   HENT: Negative.   Eyes: Negative.   Respiratory: Negative.   Cardiovascular: Negative.   Gastrointestinal: Negative.   Genitourinary: Negative.   Skin: Negative.      PHYSICAL EXAMINATION:    VITALS:   Vitals:    12/08/17 1121  BP: 120/64  Pulse: 70  SpO2: 96%  Weight: 141 lb (64 kg)  Height: 5' (1.524 m)    GEN:  The patient appears stated age and is in NAD. HEENT:  Normocephalic, atraumatic.  The mucous membranes are moist. The superficial temporal arteries are without ropiness or tenderness. CV:  RRR Lungs:  CTAB Neck/HEME:  There are no carotid bruits bilaterally.  Neurological examination:  Orientation: difficult to assess given language barrier.  Is very alert and responsive to commands.   Cranial nerves: There is good facial symmetry with the exception of the fact that the right palpebral fissure is smaller than the left. The visual fields are full to confrontational testing. The speech appears fluent and clear, but she does not speak my language. Soft palate rises symmetrically and there is no tongue deviation. Hearing is intact to conversational tone. Sensation: Sensation is intact to light touch throughout. Motor: Strength is 5/5 throughout.   Movement examination: Tone: seems to have mod increased tone but difficulty relaxing (same as prior) Abnormal movements: There is a near constant mild RUE resting tremor Coordination:  There is decremation, with any form of RAMS, including alternating supination and pronation of the forearm, hand opening and closing, finger taps, heel taps and toe taps bilaterally, R more than L Gait and Station: The patient has some difficulty arising out of a deep-seated chair without the use of the hands.   The patient's stride length is decreased.   She shuffles slightly and is slightly unstable.  She drags the right leg and is unsteady.  When given a walker, she is much better and much more stable (same as previous).    Labs:  Lab work was reviewed from her primary care physician dated November 05, 2016.  White blood cells were 6.2, hemoglobin 12.2, hematocrit 37.9 and platelets 140.  Sodium was 139, potassium 3.9, chloride 103, CO2 29, BUN 9, creatinine  0.57.  AST 16, ALT 14, alkaline phosphatase 78.  Sedimentation rate was 12.  ASSESSMENT/PLAN:  1.   Idiopathic Parkinson's disease, newly diagnosed on 04/08/2014.  The patient has tremor, bradykinesia, rigidity and postural instability.  -She is noting more cramping and more tremor.  Neighbor feels that she has been compliant with medication.  Will increase her levodopa.  She will take carbidopa/levodopa 25/100, 2 tablets at 6 AM/1 tablet at 10 AM/2 tablets at 2 PM/1 tablet at 6 PM.  -We will continue carbidopa/levodopa 50/200 at night.  -Told her that I wanted to use her walker at all times.  She has significant shuffling and start hesitation without it and does well with it.  -Talked to her about the fact that I really need to see her more than once per year. 2.  Follow-up in the next 4 to  5 months, sooner should new neurologic issues arise.  Much greater than 50% of this visit was spent in counseling and coordinating care.  Total face to face time:  25 min

## 2017-12-08 ENCOUNTER — Ambulatory Visit (INDEPENDENT_AMBULATORY_CARE_PROVIDER_SITE_OTHER): Payer: Medicare Other | Admitting: Neurology

## 2017-12-08 ENCOUNTER — Encounter: Payer: Self-pay | Admitting: Neurology

## 2017-12-08 VITALS — BP 120/64 | HR 70 | Ht 60.0 in | Wt 141.0 lb

## 2017-12-08 DIAGNOSIS — G20 Parkinson's disease: Secondary | ICD-10-CM

## 2017-12-08 NOTE — Patient Instructions (Signed)
1.  Take carbidopa/levodopa 25/100, 2 tablets at 6am/1 tablet at 10am/2 tablets at 2pm/1 tablet at 6pm  2.  Continue carbidopa/levodopa 50/200 extended release at bedtime  3.  Use your walker at all times

## 2018-02-21 ENCOUNTER — Other Ambulatory Visit: Payer: Self-pay | Admitting: Neurology

## 2018-05-11 NOTE — Progress Notes (Signed)
Virtual Visit via Video Note The purpose of this virtual visit is to provide medical care while limiting exposure to the novel coronavirus.    Consent was obtained for video visit:  Yes.   Answered questions that patient had about telehealth interaction:  Yes.   I discussed the limitations, risks, security and privacy concerns of performing an evaluation and management service by telemedicine. I also discussed with the patient that there may be a patient responsible charge related to this service. The patient expressed understanding and agreed to proceed.  Pt location: Home Physician Location: office Name of referring provider:  [name], MD I connected with [name] at patients initiation/request on 05/12/2018 at 10:30 AM EDT by video enabled telemedicine application and verified that I am speaking with the correct person using two identifiers. Pt MRN:  [id number] Pt DOB:  07-12-1934 Video Participants:  [name];  son   History of Present Illness:  Patient seen today in follow-up for Parkinson's disease.  Last visit, I increased her medication because of cramping and tremor so that she was taking carbidopa/levodopa 25/100, 2 tablets at 6 AM, 1 tablet at 10 AM, 2 tablets at 2 PM, 1 tablet at 6 PM.  This was in addition to carbidopa/levodopa, 50/200 at bedtime.  She is still having some cramping.  It may be a wearing off phenomenon.  Stressed the importance of using her walker at all times last visit.  They report that she is using the walker..  No falls since last visit.  She has some lightheadedness but no near syncope.  Really is not doing any physical therapy in the home, because the neighbor was the one doing the physical therapy and she is social distancing right now.   Observations/Objective:   Vitals:   05/12/18 1011  Weight: 141 lb (64 kg)  Height: 5' (1.524 m)   GEN:  The patient appears stated age and is in NAD.  Neurological examination:  Orientation:  The patient is alert and oriented x3. Cranial nerves: There is good facial symmetry. There is no significant facial hypomimia.  The speech is fluent and clear. Soft palate rises symmetrically and there is no tongue deviation. Hearing is intact to conversational tone. Motor: Strength is at least antigravity x 4.   Shoulder shrug is equal and symmetric.  There is no pronator drift.  Movement examination: Tone: unable Abnormal movements: There is right upper extremity resting tremor Coordination:  There is mild decremation with RAM's, with hand opening and closing and finger taps bilaterally Gait and Station: The patient pushes off of the table to arise.  She is short stepped.  She has decreased arm swing with right upper extremity tremor upon ambulation.    Assessment and Plan:   1.  Parkinson's disease  -Take carbidopa/levodopa 25/100, 2 tablets at 6 AM/1 tablet at 10 AM/2 tablets at 2 PM/1 tablet at 6 PM.  If wakes up in the middle of the night with tremor, can take an extra half tablet  -Continue carbidopa/levodopa 50/200 at bedtime.  -Add entacapone, 200 mg, 1 tablet at 6 AM/10 AM/2 PM/6 PM.  Discussed discoloration of the urine.  Will call me if she has any diarrhea.  Risks, benefits, side effects and alternative therapies were discussed.  The opportunity to ask questions was given and they were answered to the best of my ability.  The patient expressed understanding and willingness to follow the outlined treatment protocols.  -Offered in-home physical therapy given that  she is homebound.  They want to hold on that for right now.  Follow Up Instructions:  Follow-up 5 months.  -I discussed the assessment and treatment plan with the patient. The patient was provided an opportunity to ask questions and all were answered. The patient agreed with the plan and demonstrated an understanding of the instructions.   The patient was advised to call back or seek an in-person evaluation if the symptoms  worsen or if the condition fails to improve as anticipated.    Total Time spent in visit with the patient was:  15 min , of which more than 50% of the time was spent in counseling and/or coordinating care on safety.   Pt understands and agrees with the plan of care outlined.     Katherine Bogus, DO

## 2018-05-12 ENCOUNTER — Ambulatory Visit: Payer: Medicare Other | Admitting: Neurology

## 2018-05-12 ENCOUNTER — Other Ambulatory Visit: Payer: Self-pay

## 2018-05-12 ENCOUNTER — Encounter: Payer: Self-pay | Admitting: Neurology

## 2018-05-12 ENCOUNTER — Encounter

## 2018-05-12 ENCOUNTER — Telehealth (INDEPENDENT_AMBULATORY_CARE_PROVIDER_SITE_OTHER): Payer: Medicare Other | Admitting: Neurology

## 2018-05-12 DIAGNOSIS — G20 Parkinson's disease: Secondary | ICD-10-CM | POA: Diagnosis not present

## 2018-05-12 DIAGNOSIS — G20A1 Parkinson's disease without dyskinesia, without mention of fluctuations: Secondary | ICD-10-CM

## 2018-05-12 MED ORDER — CARBIDOPA-LEVODOPA ER 50-200 MG PO TBCR: EXTENDED_RELEASE_TABLET | ORAL | 1 refills | Status: DC

## 2018-05-12 MED ORDER — ENTACAPONE 200 MG PO TABS: 200.0000 mg | ORAL_TABLET | Freq: Four times a day (QID) | ORAL | 1 refills | Status: DC

## 2018-05-12 MED ORDER — CARBIDOPA-LEVODOPA 25-100 MG PO TABS: ORAL_TABLET | ORAL | 1 refills | Status: DC

## 2018-06-08 ENCOUNTER — Telehealth: Payer: Self-pay | Admitting: Neurology

## 2018-06-08 NOTE — Telephone Encounter (Signed)
Re: constipation.  The advice you gave was correct.  Too much ducolax is going to cause bowels to be lazy.  Give colace (stool softener) daily and miralax daily in addition to rancho recipe.  Re: confusion.  Stop the entacapone.    Re: expectations.  Everyones course with PD is different and cannot predict rate of decline unfortunately.  PD doesn't change life span itself

## 2018-06-08 NOTE — Telephone Encounter (Signed)
Pt called with c/o:  confusion/hallucinations New medications?  Yes.  ENTACAPONE  When did they start?  2 WEEKS AGO If hallucinations are new, has patient been checked for infection, including UTI? NO, they are more aggressive lately  No. Current medications prescribed by Dr. Carles Collet and TIMES taking the medications: taken all medication as prescribe the only one she has not taken is the entacapone   Pt son would like to know if its ok to stop taken this medication right now. She said its not helping as much. He would like to know what's the expectation with time with patient with parkinson . The family would like to know so they would know how to proceed with her medical health.

## 2018-06-08 NOTE — Telephone Encounter (Signed)
Pt complains of constipation. How much water is patient drinking?  Not sure how much water, but has been drinking Is patient on any pain medications/narcotics? NO Is patient taking any medication for it? DULCOLAX   Pt given following advice:  1.Rancho recipe for constipation in Parkinsons Disease:  -1 cup of unprocessed bran (need to get this at [employer], [employer] or similar type of store), 2 cups of applesauce in 1 cup of prune juice 2.  Increase fiber intake (Metamucil,vegetables) 3.  Regular, moderate exercise can be beneficial. 4.  Avoid medications causing constipation, such as medications like antacids with calcium or magnesium 5.  Daily Miralax can be beneficial for some, and tapered if patient has loose stools or diarrhea 6.  Stool softeners (Colace) can help with chronic constipation and recommend daily colace 7.  Increase water intake.  You should be drinking 1/2 gallon of water a day as long as you have not been diagnosed with congestive heart failure or renal/kidney failure.  This is probably the single greatest thing that you can do to help your constipation.

## 2018-06-08 NOTE — Telephone Encounter (Signed)
Pt's son Mr. Mabe called to inform that his mother has been experiencing nausea and constipation and has also been confused. He is not sure if it is due to a medication that she is taking or the disease. Pls call him.

## 2018-06-08 NOTE — Telephone Encounter (Signed)
Called spoke with patient Son he was informed of provider response below

## 2018-11-19 ENCOUNTER — Other Ambulatory Visit: Payer: Self-pay | Admitting: Neurology

## 2018-11-20 ENCOUNTER — Other Ambulatory Visit: Payer: Self-pay | Admitting: Neurology

## 2018-11-24 ENCOUNTER — Telehealth: Payer: Self-pay | Admitting: Neurology

## 2018-11-24 NOTE — Telephone Encounter (Signed)
Needs appointment no refills until then, thank you

## 2018-11-24 NOTE — Telephone Encounter (Signed)
Patient son called and states that patient needs Carbidopa levodopa 25-100 and the Carbidopa levodopa 50-200  Called into the [facility] @ [phone]    Last seen 05-12-18

## 2018-11-26 ENCOUNTER — Other Ambulatory Visit: Payer: Self-pay

## 2018-11-26 ENCOUNTER — Encounter: Payer: Self-pay | Admitting: Neurology

## 2018-11-26 ENCOUNTER — Telehealth (INDEPENDENT_AMBULATORY_CARE_PROVIDER_SITE_OTHER): Payer: Medicare Other | Admitting: Neurology

## 2018-11-26 VITALS — BP 127/73 | HR 58 | Ht 60.0 in | Wt 127.0 lb

## 2018-11-26 DIAGNOSIS — G20 Parkinson's disease: Secondary | ICD-10-CM

## 2018-11-26 MED ORDER — CARBIDOPA-LEVODOPA ER 50-200 MG PO TBCR: EXTENDED_RELEASE_TABLET | ORAL | 1 refills | Status: DC

## 2018-11-26 MED ORDER — CARBIDOPA-LEVODOPA 25-100 MG PO TABS: ORAL_TABLET | ORAL | 1 refills | Status: DC

## 2018-11-26 NOTE — Progress Notes (Signed)
Virtual Visit via Video Note The purpose of this virtual visit is to provide medical care while limiting exposure to the novel coronavirus.  Patient staying in [place], [place] right now and was unable to get into the visit.  Consent was obtained for video visit:  Yes.   Answered questions that patient had about telehealth interaction:  Yes.   I discussed the limitations, risks, security and privacy concerns of performing an evaluation and management service by telemedicine. I also discussed with the patient that there may be a patient responsible charge related to this service. The patient expressed understanding and agreed to proceed.  Pt location: Home Physician Location: office Name of referring provider:  [name], MD I connected with [name] at patients initiation/request on 11/26/2018 at 11:15 AM EST by video enabled telemedicine application and verified that I am speaking with the correct person using two identifiers. Pt MRN:  [id number] Pt DOB:  Nov 30, 1934 Video Participants:  [name];  Daughter present and son on the phone who supplements the history   History of Present Illness:  Patient seen today in follow-up for Parkinson's disease.  Last visit, I increased her medication because of cramping and tremor so that she was taking carbidopa/levodopa 25/100, 2 tablets at 6 AM, 1 tablet at 10 AM, 2 tablets at 2 PM, 1 tablet at 6 PM.  This was in addition to carbidopa/levodopa, 50/200 at bedtime.  We added entacapone, 200 mg, 1 tablet at 6 AM/10 AM/2 PM/6 PM, with each levodopa daytime dose last visit.  They report that she is not taking that because of confusion issues.  They had called here and we d/c it.   Observations/Objective:   Vitals:   11/26/18 0927  BP: 127/73  Pulse: (!) 58  Weight: 127 lb (57.6 kg)  Height: 5' (1.524 m)   GEN:  The patient appears stated age and is in NAD.  Neurological examination:  Orientation: The patient is alert  and oriented x3. Cranial nerves: There is good facial symmetry. There is no significant facial hypomimia.   Soft palate rises symmetrically and there is no tongue deviation. Hearing is intact to conversational tone. Motor: Strength is at least antigravity x 4.   Shoulder shrug is equal and symmetric.  There is no pronator drift.  Movement examination: Tone: unable Abnormal movements: Did not see her tremor today, but also did not see the hands at rest very much. Coordination:  There is mild decremation with RAM's, with hand opening and closing and finger taps bilaterally Gait and Station: The patient pushes off the couch to arise.  She has her cane.  She is short stepped, but walks fairly well today.    Assessment and Plan:   1.  Parkinson's disease  -Take carbidopa/levodopa 25/100, 2 tablets at 6 AM/1 tablet at 10 AM/2 tablets at 2 PM/1 tablet at 6 PM. Refilled today. If wakes up in the middle of the night with tremor, can take an extra half tablet  -Continue carbidopa/levodopa 50/200 at bedtime. Refilled today  -off entacapone due to confusion  Follow Up Instructions:  Follow-up 6 months.  -I discussed the assessment and treatment plan with the patient. The patient was provided an opportunity to ask questions and all were answered. The patient agreed with the plan and demonstrated an understanding of the instructions.   The patient was advised to call back or seek an in-person evaluation if the symptoms worsen or if the condition fails to improve as  anticipated.    Total Time spent in visit with the patient was:  15 min , of which more than 50% of the time was spent in counseling and/or coordinating care on safety.   Pt understands and agrees with the plan of care outlined.     Alonza Bogus, DO

## 2019-03-15 NOTE — Progress Notes (Signed)
Progress Notes  by Robynn Pane at 03/15/19 1530                Author: Robynn Pane  Service: --  Author Type: Technician       Filed: 03/15/19 1531  Encounter Date: 03/15/2019  Status: Signed          Editor: Robynn Pane (Technician)                  Date of Service: 2019-03-09  Work Status:              CHIEF COMPLAINT:  Pain             HPI:   03-04-2019: she was walking down steps and missed last step falling on left arm   03-04-2019: [facility] Urgent care xrays , splint and ace wrap        Location of pain - wrist    Type/severity pain - dull throbbing   Aggravating factors -  bending, twisting activity   Alleviating factors -  rest      PMSFH:  Included on the patient history form; reviewed and signed     MEDS:         alendronate (70  mg , .);carbidopa-levodopa (25-100 mg , .);Fluad Quad 0175-10(25E up)(PF) (60 mcg (15 mcg x 4)/0.5 mL , .);metoprolol tartrate (25 mg , .);naproxen (500 mg , .);omeprazole (20 mg , .);rosuvastatin (5 mg , .)   ALLERGIES: nkda   PMH: parkinsons, osteoarthritis, hypertension     SOCIAL: retired, widowed: non smoker    ROS:  Per POA form: positive and negative system reviews were discussed.  I tried to relate any positive system review to the musculoskeletal complaint. For all others, recommend the PCP or any specialists      PE:  Patient is alert and oriented.   Nutrition, appearance and mood all okay.      RESPIRATIONS:  Unlabored with no obvious shortness of breath.      NV:  Appears to have pulses, color, capillary refill, intact touch sensation        SKIN:  wrist area swelling; no redness       MS:     UE pain location:  distal radius only - no ulnar styloid pain; no elbow or wrist pain; has pronation/supination    Has motion; has strength; no instability or crepitance.         She only has images on her phone: XR: Left side - AP, lateral, oblique taken [facility] 03-04-2019: intra-articular distal radial fracture; ulnar  styloid fracture     XR Impression:  As above        Independent interpretation of XR performed by [facility]      DX:  Left intra-articular distal radial fracture; ulnar styloid fracture        The patient, daughter and I discussed the above diagnoses and explored different options.  Plan:    Discussed and addressed each undiagnosed new problem with uncertain prognosis       Test and documents:   Review of prior external notes: [facility]   Discussed potential MRI scan     Discussed home care        History from and discussion of management or test interpretation: daughter and son by phone        MDM:   Medication: recommended    OTC meds prn   Boswellia, Devils claw, Magne sports, Turmeric-curcumin  Immobilization: Placed in an Exos wrist splint    Discussed potential ORIF major surgery without patient and procedural risk factors identified   Decision was made with the patient against surgery at this time based on her XR on her phone         Limited use of the involved extremity          Electronically Signed By Marcellina Millin MD

## 2019-03-15 NOTE — Progress Notes (Signed)
Date of Service: 2019-03-09 Work Status: ?????     CHIEF COMPLAINT:  Pain           HPI:  03-04-2019: she was walking down steps and missed last step falling on left arm  03-04-2019: [facility] Urgent care xrays , splint and ace wrap      Location of pain ??? wrist   Type/severity pain - dull throbbing  Aggravating factors -  bending, twisting activity  Alleviating factors -  rest    PMSFH:  Included on the patient history form; reviewed and signed    MEDS:         alendronate (70 mg , .);carbidopa-levodopa (25-100 mg , .);Fluad Quad 0623-76(28B up)(PF) (60 mcg (15 mcg x 4)/0.5 mL , .);metoprolol tartrate (25 mg , .);naproxen (500 mg , .);omeprazole (20 mg , .);rosuvastatin (5 mg , .)  ALLERGIES: nkda  PMH: parkinsons, osteoarthritis, hypertension    SOCIAL: retired, widowed: non smoker   ROS:  Per POA form: positive and negative system reviews were discussed.  I tried to relate any positive system review to the musculoskeletal complaint. For all others, recommend the PCP or any specialists    PE:  Patient is alert and oriented.  Nutrition, appearance and mood all okay.    RESPIRATIONS:  Unlabored with no obvious shortness of breath.    NV:  Appears to have pulses, color, capillary refill, intact touch sensation      SKIN:  wrist area swelling; no redness     MS:    UE pain location:  distal radius only ??? no ulnar styloid pain; no elbow or wrist pain; has pronation/supination   Has motion; has strength; no instability or crepitance.       She only has images on her phone: XR: Left side - AP, lateral, oblique taken [facility] 03-04-2019: intra-articular distal radial fracture; ulnar styloid fracture    XR Impression:  As above     Independent interpretation of XR performed by [facility]    DX:  Left intra-articular distal radial fracture; ulnar styloid fracture      The patient, daughter and I discussed the above diagnoses and explored different options.  Plan:    Discussed and addressed each undiagnosed new problem with uncertain prognosis     Test and documents:  Review of prior external notes: [facility]  Discussed potential MRI scan    Discussed home care       History from and discussion of management or test interpretation: daughter and son by phone      MDM:  Medication: recommended   OTC meds prn  Boswellia, Devil???s claw, Magne sports, Turmeric-curcumin   Immobilization: Placed in an Exos wrist splint   Discussed potential ORIF major surgery without patient and procedural risk factors identified  Decision was made with the patient against surgery at this time based on her XR on her phone       Limited use of the involved extremity      Electronically Signed By Marcellina Millin MD

## 2019-03-16 ENCOUNTER — Ambulatory Visit: Attending: Orthopaedic Surgery

## 2019-03-16 ENCOUNTER — Encounter: Admit: 2019-03-16 | Discharge: 2019-03-18 | Payer: PRIVATE HEALTH INSURANCE

## 2019-03-16 ENCOUNTER — Ambulatory Visit: Admit: 2019-03-16 | Discharge: 2019-03-16 | Payer: PRIVATE HEALTH INSURANCE | Attending: Orthopaedic Surgery

## 2019-03-16 DIAGNOSIS — M25532 Pain in left wrist: Secondary | ICD-10-CM

## 2019-03-16 NOTE — Progress Notes (Signed)
No additional comment

## 2019-03-16 NOTE — Progress Notes (Signed)
Left wrist AP lateral oblique taken today with impacted distal radius intra-articular fracture with dorsal comminuted section.  Significant wrist carpometacarpal and carpal arthritis; ulnar styloid fracture    XR impression: as above

## 2019-03-16 NOTE — Progress Notes (Signed)
She is with her daughter today.  They are hoping to get her out of the thicker cast/Exos into something a little softer and easier for her to take on and off.       HPI:  03-04-2019: she was walking down steps and missed last step falling on left arm  03-04-2019: [facility] Urgent care xrays , splint and ace wrap      Location of pain ??? wrist  Type/severity pain - dull aching  Aggravating factors -  bending, twisting activity  Alleviating factors -  rest  ??  PMSFH:  Included on the patient history form; reviewed   MEDS:         alendronate (70 mg , .);carbidopa-levodopa (25-100 mg , .);Fluad Quad 2440-10(27O up)(PF) (60 mcg (15 mcg x 4)/0.5 mL , .);metoprolol tartrate (25 mg , .);naproxen (500 mg , .);omeprazole (20 mg , .);rosuvastatin (5 mg , .)  ALLERGIES: nkda  PMH:   parkinsons, osteoarthritis, hypertension            SOCIAL:          retired, widowed: non smoker   ROS:   Per POA form: positive and negative system reviews were discussed.  I tried to relate any positive system review to the musculoskeletal complaint. For all others, recommend the PCP or any specialists  ??  PE:  Patient is alert and oriented.  Nutrition, appearance and mood all okay.  ??  RESPIRATIONS:  Unlabored with no obvious shortness of breath.  ??  NV:  Appears to have pulses, color, capillary refill, intact touch sensation    ??  SKIN: mild wrist area swelling; no redness   ??  MS:    UE pain location:  distal radius only ??? no ulnar styloid pain; no elbow or wrist pain; has pronation/supination   Has motion; has strength; no instability or crepitance.     ??  Left wrist AP lateral oblique taken today with impacted distal radius intra-articular fracture with dorsal comminuted section.  Significant wrist carpometacarpal and carpal arthritis; ulnar styloid fracture    XR Impression:  As above   ??  DX:      Left intra-articular distal radial fracture; ulnar styloid fracture    ??  The patient, daughter and I discussed the above diagnoses and explored  different options.              Discussed the comminuted nature of her fracture but at her age her tilt seems to be acceptable and we decided to leave this alone without attempting any type of surgery.  ??  Test and documents:  Discussed potential CTscan    Discussed home care       History from and discussion of management : daughter    ??  MDM:  Medication: recommended   OTC meds prn  Boswellia, Devil???s claw, Magne sports, Turmeric-curcumin   Immobilization: Placed in a lighter wrist splint   Discussed potential ORIF major surgery without patient and procedural risk factors identified  Decision was made with the patient against surgery at this time based on her age  Limited use of the involved extremity

## 2019-03-16 NOTE — Progress Notes (Addendum)
Patient was fitted and instructed on an  Wrist Splint for patients left wrist. Patient is aware the hand slides in the brace with the thumb placed threw the thumb hole. I demonstrated the correct way to tighten the brace straps to allow for a comfortable fit. Patient understood the correct way to wear the brace.  Patient read and signed documenting they understand and agree to POA's current DME return policy.

## 2019-03-16 NOTE — Progress Notes (Signed)
She is with her daughter today.  They are hoping to get her out of the thicker cast/Exos into something a little softer and easier for her to take on and off.       HPI:  03-04-2019: she was walking down steps and missed last step falling on left arm  03-04-2019: [facility] Urgent care xrays , splint and ace wrap      Location of pain ??? wrist  Type/severity pain - dull aching  Aggravating factors -  bending, twisting activity  Alleviating factors -  rest  ??  PMSFH:  Included on the patient history form; reviewed   MEDS:         alendronate (70 mg , .);carbidopa-levodopa (25-100 mg , .);Fluad Quad 1638-45(36I up)(PF) (60 mcg (15 mcg x 4)/0.5 mL , .);metoprolol tartrate (25 mg , .);naproxen (500 mg , .);omeprazole (20 mg , .);rosuvastatin (5 mg , .)  ALLERGIES: nkda  PMH:   parkinsons, osteoarthritis, hypertension            SOCIAL:          retired, widowed: non smoker   ROS:   Per POA form: positive and negative system reviews were discussed.  I tried to relate any positive system review to the musculoskeletal complaint. For all others, recommend the PCP or any specialists  ??  PE:  Patient is alert and oriented.  Nutrition, appearance and mood all okay.  ??  RESPIRATIONS:  Unlabored with no obvious shortness of breath.  ??  NV:  Appears to have pulses, color, capillary refill, intact touch sensation    ??  SKIN: mild wrist area swelling; no redness   ??  MS:    UE pain location:  distal radius only ??? no ulnar styloid pain; no elbow or wrist pain; has pronation/supination   Has motion; has strength; no instability or crepitance.     ??  Left wrist AP lateral oblique taken today with impacted distal radius intra-articular fracture with dorsal comminuted section.  Significant wrist carpometacarpal and carpal arthritis; ulnar styloid fracture    XR Impression:  As above   ??  DX:      Left intra-articular distal radial fracture; ulnar styloid fracture    ??   The patient, daughter and I discussed the above diagnoses and explored different options.              Discussed the comminuted nature of her fracture but at her age her tilt seems to be acceptable and we decided to leave this alone without attempting any type of surgery.  ??  Test and documents:  Discussed potential CTscan    Discussed home care       History from and discussion of management : daughter    ??  MDM:  Medication: recommended   OTC meds prn  Boswellia, Devil???s claw, Magne sports, Turmeric-curcumin   Immobilization: Placed in a lighter wrist splint   Discussed potential ORIF major surgery without patient and procedural risk factors identified  Decision was made with the patient against surgery at this time based on her age  Limited use of the involved extremity

## 2019-03-16 NOTE — Progress Notes (Signed)
Left wrist AP lateral oblique taken today with impacted distal radius intra-articular fracture with dorsal comminuted section.  Significant wrist carpometacarpal and carpal arthritis; ulnar styloid fracture    XR impression: as above

## 2019-04-20 ENCOUNTER — Ambulatory Visit: Attending: Orthopaedic Surgery

## 2019-04-20 ENCOUNTER — Encounter: Admit: 2019-04-20 | Discharge: 2019-04-20 | Payer: PRIVATE HEALTH INSURANCE

## 2019-04-20 ENCOUNTER — Ambulatory Visit: Admit: 2019-04-20 | Discharge: 2019-04-20 | Payer: PRIVATE HEALTH INSURANCE | Attending: Orthopaedic Surgery

## 2019-04-20 DIAGNOSIS — M25532 Pain in left wrist: Secondary | ICD-10-CM

## 2019-04-20 NOTE — Progress Notes (Addendum)
Progress Notes by Marcellina Millin, MD at 04/20/19 1415                Author: Marcellina Millin, MD  Service: --  Author Type: Physician       Filed: 04/25/19 1545  Encounter Date: 04/20/2019  Status: Signed          Editor: Marcellina Millin, MD (Physician)                       Name: Amber Ball   Date of Birth: 1934/11/05   Gender: female   MRN: [id number]      She is here with her daughter today.   She is improving.         HPI:   03-04-2019: she was walking down steps and missed last step falling on left arm   03-04-2019: [facility] Urgent care xrays , splint and ace wrap        Location of pain - wrist    Type/severity pain - dull    Aggravating factors -a lot of twisting activity   Alleviating factors -  rest      PMSFH:  Included on the patient history form; reviewed       ALLERGIES: nkda   PMH: parkinsons, osteoarthritis, hypertension     SOCIAL: retired, widowed: non smoker        PE:  Patient is alert and oriented.   Nutrition, appearance and mood all okay.      RESPIRATIONS:  Unlabored with no obvious shortness of breath.      NV:  Appears to have pulses, color, capillary refill, intact touch sensation        SKIN: Mild wrist area swelling; no redness       MS:     UE pain location:  distal ulna greater than radius; no pain with  pronation/supination    Has motion; has strength; no instability or crepitance.         XR: Left side - AP, lateral, oblique wrist taken today with healing comminuted  intra-articular distal radial fracture; ulnar styloid fracture     XR Impression:  As above       DX:  Left intra-articular distal radial fracture; ulnar styloid fracture        The patient, daughter and I discussed the above diagnoses and explored different options.  Plan:    Discussed her x-ray position and healing.  She hurts more along her ulnar styloid and the distal radius   We did discuss the possibility of ulnar ligament/triangular fibrocartilage concerns with the ulnar styloid fracture   She will  continue with her splint until she is pain-free   She is planning to go back to [place] so she will come to visit me one time before she leaves.   We did discuss surgical treatment for the wrist but, same as last visit, the patient and her daughter prefer no surgery   Discussed potential wrist MRI scan     Discussed home care and protection   OTC meds prn   Boswellia, Devils claw, Magne sports, Turmeric-curcumin    Last visit she was placed in an Exos wrist splint, but they prefer the smaller over-the-counter  brace      All return x-ray left wrist 3 views      Limited use of the involved extremity

## 2019-04-20 NOTE — Progress Notes (Signed)
Name: Amber Ball  Date of Birth: 04-02-1934  Gender: female  MRN: [id number]    She is here with her daughter today.  She is improving.       HPI:  03-04-2019: she was walking down steps and missed last step falling on left arm  03-04-2019: [facility] Urgent care xrays , splint and ace wrap      Location of pain ??? wrist   Type/severity pain - dull   Aggravating factors -a lot of twisting activity  Alleviating factors -  rest    PMSFH:  Included on the patient history form; reviewed      ALLERGIES: nkda  PMH: parkinsons, osteoarthritis, hypertension    SOCIAL: retired, widowed: non smoker      PE:  Patient is alert and oriented.  Nutrition, appearance and mood all okay.    RESPIRATIONS:  Unlabored with no obvious shortness of breath.    NV:  Appears to have pulses, color, capillary refill, intact touch sensation      SKIN: Mild wrist area swelling; no redness     MS:    UE pain location:  distal ulna greater than radius; no pain with pronation/supination   Has motion; has strength; no instability or crepitance.       XR: Left side - AP, lateral, oblique wrist taken today with healing comminuted intra-articular distal radial fracture; ulnar styloid fracture    XR Impression:  As above     DX:  Left intra-articular distal radial fracture; ulnar styloid fracture      The patient, daughter and I discussed the above diagnoses and explored different options.  Plan:   Discussed her x-ray position and healing.  She hurts more along her ulnar styloid and the distal radius  We did discuss the possibility of ulnar ligament/triangular fibrocartilage concerns with the ulnar styloid fracture  She will continue with her splint until she is pain-free  She is planning to go back to [place] so she will come to visit me one time before she leaves.  We did discuss surgical treatment for the wrist but, same as last visit, the patient and her daughter prefer no surgery  Discussed potential wrist MRI scan    Discussed home care and  protection  OTC meds prn  Boswellia, Devil???s claw, Magne sports, Turmeric-curcumin   Last visit she was placed in an Exos wrist splint, but they prefer the smaller over-the-counter brace    All return x-ray left wrist 3 views    Limited use of the involved extremity

## 2019-05-11 ENCOUNTER — Encounter: Attending: Orthopaedic Surgery

## 2019-05-12 ENCOUNTER — Other Ambulatory Visit: Payer: Self-pay | Admitting: Neurology

## 2019-05-28 NOTE — Progress Notes (Deleted)
Assessment/Plan:   1.  Parkinsons Disease  -***Continue carbidopa/levodopa 25/100, 2 tablets at 6 AM/1 tablet at 10 AM/2 tablets at 2 PM/1 tablet at 6 PM (with an additional half tablet in the middle the night if wakes up with tremor)  -Continue carbidopa/levodopa 50/200 at bedtime   Subjective:   Katherine Novak was seen today in follow up for Parkinsons disease.  My previous records were reviewed prior to todays visit as well as outside records available to me. Pt denies falls.  Pt denies lightheadedness, near syncope.  No hallucinations.  Mood has been good.  Current prescribed movement disorder medications: ***Carbidopa/levodopa 25/100, 2 tablets at 6 AM/1 tablet at 10 AM/2 tablets at 2 PM/1 tablet at 6 PM (can take an extra half tablet if wakes up in the middle of the night with tremor) Carbidopa/levodopa 50/200 at bedtime    PREVIOUS MEDICATIONS: {Parkinson's RX:18200} entacapone (confusion)  ALLERGIES:   Allergies  Allergen Reactions  . Sulfa Antibiotics     Angioedema of lips , throat    CURRENT MEDICATIONS:  Outpatient Encounter Medications as of 05/31/2019  Medication Sig  . acetaminophen (TYLENOL) 325 MG tablet Take 650 mg by mouth every 6 (six) hours as needed.  Marland Kitchen alendronate (FOSAMAX) 70 MG tablet Take 70 mg by mouth every 7 (seven) days. Take with a full glass of water on an empty stomach.  On Wednesday  . carbidopa-levodopa (SINEMET CR) 50-200 MG tablet TAKE 1 TABLET BY MOUTH EVERYDAY AT BEDTIME  . carbidopa-levodopa (SINEMET IR) 25-100 MG tablet TAKE 2 TABLETS AT 6AM 1 TABLET AT 10AM 2 TABLETS AT 2PM 1 TABLET AT 6PM  . diclofenac sodium (VOLTAREN) 1 % GEL Apply topically 4 (four) times daily.  . entacapone (COMTAN) 200 MG tablet Take 1 tablet (200 mg total) by mouth 4 (four) times daily. 6am/10am/2pm/6pm with carbidopa/levodopa 25/100  . furosemide (LASIX) 20 MG tablet Take 20 mg by mouth daily as needed.  Marland Kitchen HYDROcodone-acetaminophen (NORCO) 10-325 MG per  tablet Take 1-2 tablets by mouth every 6 (six) hours as needed for moderate pain or severe pain.  . metoprolol tartrate (LOPRESSOR) 25 MG tablet Take 25 mg by mouth 2 (two) times daily.  Marland Kitchen omeprazole (PRILOSEC) 20 MG capsule Take 20 mg by mouth daily.  . rosuvastatin (CRESTOR) 5 MG tablet Take 5 mg by mouth daily.   No facility-administered encounter medications on file as of 05/31/2019.    Objective:   PHYSICAL EXAMINATION:    VITALS:  There were no vitals filed for this visit.  GEN:  The patient appears stated age and is in NAD. HEENT:  Normocephalic, atraumatic.  The mucous membranes are moist. The superficial temporal arteries are without ropiness or tenderness. CV:  RRR Lungs:  CTAB Neck/HEME:  There are no carotid bruits bilaterally.  Neurological examination:  Orientation: The patient is alert and oriented x3. Cranial nerves: There is good facial symmetry with*** facial hypomimia. The speech is fluent and clear. Soft palate rises symmetrically and there is no tongue deviation. Hearing is intact to conversational tone. Sensation: Sensation is intact to light touch throughout Motor: Strength is at least antigravity x4.  Movement examination: Tone: There is ***tone in the *** Abnormal movements: *** Coordination:  There is *** decremation with RAM's, *** Gait and Station: The patient has *** difficulty arising out of a deep-seated chair without the use of the hands. The patient's stride length is ***.  The patient has a *** pull test.  Total time spent on today's visit was ***30 minutes, including both face-to-face time and nonface-to-face time.  Time included that spent on review of records (prior notes available to me/labs/imaging if pertinent), discussing treatment and goals, answering patient's questions and coordinating care.  Cc:  [name], MD

## 2019-05-31 ENCOUNTER — Ambulatory Visit (INDEPENDENT_AMBULATORY_CARE_PROVIDER_SITE_OTHER): Payer: Medicare Other | Admitting: Neurology

## 2019-05-31 ENCOUNTER — Ambulatory Visit: Payer: Medicare Other | Admitting: Neurology

## 2019-05-31 ENCOUNTER — Other Ambulatory Visit: Payer: Self-pay

## 2019-05-31 ENCOUNTER — Encounter: Payer: Self-pay | Admitting: Neurology

## 2019-05-31 VITALS — BP 162/80 | HR 96 | Resp 20 | Ht 60.0 in | Wt 128.0 lb

## 2019-05-31 DIAGNOSIS — G20 Parkinson's disease: Secondary | ICD-10-CM

## 2019-05-31 DIAGNOSIS — F330 Major depressive disorder, recurrent, mild: Secondary | ICD-10-CM

## 2019-05-31 DIAGNOSIS — G4752 REM sleep behavior disorder: Secondary | ICD-10-CM | POA: Diagnosis not present

## 2019-05-31 MED ORDER — CARBIDOPA-LEVODOPA ER 50-200 MG PO TBCR: EXTENDED_RELEASE_TABLET | ORAL | 1 refills | Status: DC

## 2019-05-31 MED ORDER — ESCITALOPRAM OXALATE 10 MG PO TABS: 10.0000 mg | ORAL_TABLET | Freq: Every day | ORAL | 1 refills | Status: DC

## 2019-05-31 NOTE — Patient Instructions (Addendum)
1.  No changes for today 2.  Let me know if you want to change the extended release version if you 3.  Get the side rails that you tuck underneath the mattress from [place] 4.  We will try lexapro, 10 mg daily for mood.

## 2019-05-31 NOTE — Progress Notes (Signed)
Assessment/Plan:   1.  Parkinsons Disease  -Continue carbidopa/levodopa 25/100, 2 tablets at 6 AM/1 tablet at 10 AM/2 tablets at 2 PM/1 tablet at 6 PM (with an additional half tablet in the middle the night if wakes up with tremor - discussed with her that she can chew this one if needed).  Did discuss changing this to the CR version due to lightheadedness, but ultimately signed decided to hold off on that.  I did discuss with him that I think most of the lightheadedness is likely coming from her blood pressure medication (metoprolol/furosemide).  Discussed concept of permissive hypertension as it relates to Parkinson's disease.  -Continue carbidopa/levodopa 50/200 at bedtime  -walker at all times  2.  REM behavior disorder  -This is commonly associated with PD and the patient is experiencing this.  We discussed that this can be very serious and even harmful.  We talked about medications as well as physical barriers to put in the bed (particularly soft bed rails, pillow barriers).  We talked about moving the night stand so that it is not so close to the side of the bed.  3.  Depression  -start lexapro, 10 mg daily.  R/b/se discussed   4.  Follow-up in 6 months   Subjective:   Katherine Novak was seen today in follow up for Parkinsons disease.  My previous records were reviewed prior to todays visit as well as outside records available to me. Pt had one fall - fell down one step and fell and fx wrist - no surgery required.   Pt has some lightheadedness but no near syncope.  Son isn't sure how much lightheadedness.    No hallucinations.  Son wonders if pt depressed.  Not as active as was.  "seems to be down."    Current prescribed movement disorder medications: Carbidopa/levodopa 25/100, 2 tablets at 6 AM/1 tablet at 10 AM/2 tablets at 2 PM/1 tablet at 6 PM (can take an extra half tablet if wakes up in the middle of the night with tremor but she never does that) Carbidopa/levodopa 50/200 at  bedtime    PREVIOUS MEDICATIONS:  entacapone (confusion)  ALLERGIES:   Allergies  Allergen Reactions  . Sulfa Antibiotics     Angioedema of lips , throat    CURRENT MEDICATIONS:  Outpatient Encounter Medications as of 05/31/2019  Medication Sig  . acetaminophen (TYLENOL) 325 MG tablet Take 650 mg by mouth every 6 (six) hours as needed.  Marland Kitchen alendronate (FOSAMAX) 70 MG tablet Take 70 mg by mouth every 7 (seven) days. Take with a full glass of water on an empty stomach.  On Wednesday  . carbidopa-levodopa (SINEMET CR) 50-200 MG tablet TAKE 1 TABLET BY MOUTH EVERYDAY AT BEDTIME  . carbidopa-levodopa (SINEMET IR) 25-100 MG tablet TAKE 2 TABLETS AT 6AM 1 TABLET AT 10AM 2 TABLETS AT 2PM 1 TABLET AT 6PM  . diclofenac sodium (VOLTAREN) 1 % GEL Apply topically 4 (four) times daily.  . entacapone (COMTAN) 200 MG tablet Take 1 tablet (200 mg total) by mouth 4 (four) times daily. 6am/10am/2pm/6pm with carbidopa/levodopa 25/100  . furosemide (LASIX) 20 MG tablet Take 20 mg by mouth daily as needed.  Marland Kitchen HYDROcodone-acetaminophen (NORCO) 10-325 MG per tablet Take 1-2 tablets by mouth every 6 (six) hours as needed for moderate pain or severe pain.  . metoprolol tartrate (LOPRESSOR) 25 MG tablet Take 25 mg by mouth 2 (two) times daily.  Marland Kitchen omeprazole (PRILOSEC) 20 MG capsule Take 20 mg  by mouth daily.  . rosuvastatin (CRESTOR) 5 MG tablet Take 5 mg by mouth daily.   No facility-administered encounter medications on file as of 05/31/2019.    Objective:   PHYSICAL EXAMINATION:    VITALS:   Vitals:   05/31/19 1206  BP: (!) 162/80  Pulse: 96  Resp: 20  SpO2: 96%  Weight: 128 lb (58.1 kg)  Height: 5' (1.524 m)    GEN:  The patient appears stated age and is in NAD. HEENT:  Normocephalic, atraumatic.  The mucous membranes are moist. The superficial temporal arteries are without ropiness or tenderness. CV:  RRR Lungs:  CTAB Neck/HEME:  There are no carotid bruits bilaterally.  Neurological  examination:  Orientation: The patient is alert and oriented x3. Cranial nerves: There is good facial symmetry with facial hypomimia. Doesn't speak english but son reports fluent/clear. Soft palate rises symmetrically and there is no tongue deviation. Hearing is intact to conversational tone. Sensation: Sensation is intact to light touch throughout Motor: Strength is at least antigravity x4.  Movement examination: Tone: There appears to be nl tone in the UE/LE but pt with trouble relaxing Abnormal movements: there is RUE rest tremor Coordination:  There is mild decremation with RAM's, with any form of RAMS, including alternating supination and pronation of the forearm, hand opening and closing, finger taps, heel taps and toe taps. Gait and Station: The patient pushes off to arise. The patient's stride length is decreased.  She does better in the open.      Total time spent on today's visit was 35 minutes, including both face-to-face time and nonface-to-face time.  Time included that spent on review of records (prior notes available to me/labs/imaging if pertinent), discussing treatment and goals, answering patient's questions and coordinating care.  Cc:  [name], MD

## 2019-07-21 ENCOUNTER — Telehealth: Payer: Medicare Other | Admitting: Neurology

## 2019-10-15 ENCOUNTER — Telehealth: Payer: Self-pay | Admitting: Neurology

## 2019-10-15 NOTE — Telephone Encounter (Signed)
Spoke with patient and he states the patient has some issues with appetite. Patient states there is a poison taste in her mouth. He states she is not sleeping well.   I did inform son that the patient should follow up with pcp and he said he thought the bad taste in his mom's mouth was from parkinson's. He wanted to know your opinion.   Patients son states his mom complains of having bad tremors in the middle of the night.   Son also states Dr Tat did not increase her medication and he thinks that maybe her medication needs to be increased.   He also wants to know if the patient can have her mouth tested because of the poison taste and because he thinks it may be related to parkinson's disease.

## 2019-10-15 NOTE — Telephone Encounter (Signed)
Patient's son Nelly Rout called and requested a call back from a nurse. He said he is concerned his mom's Parkinson's Disease may be getting worse. He further explained, "My mom is complaining more often. She is having more problems with eating and sleeping."

## 2019-10-15 NOTE — Telephone Encounter (Signed)
What type of issues with eating?  Choking or no appetite? If choking - order MBE.  If no appetite, needs to follow with PCP Try melatonin for sleep issues, 3 mg q hs but also make sure not napping during the day

## 2019-10-18 NOTE — Telephone Encounter (Signed)
Parkinsons Disease can cause some change in appetite or loss of taste but not BAD taste.  Needs follow up with PCP.  Her meds don't cause that either Update and confirm her med list based on my last note.  She should be on carbidopa/levodopa IR in the day and carbidopa/levodopa 50/200 CR at bed (her med list only has the CR q hs on it right now).  If she is taking the IR in the day and the CR at bed as RX, the CR is for the tremor in middle of the night.  If she is still waking up with tremor, then she can try and extra 1/2 tablet of the carbidopa/levodopa 25/100  IR when she wakes up in middle of the night with tremor prn (chew it). 3.  Already addressed sleeping in prior message - stay active in day - no naps.  Try melatonin 3 mg q hs

## 2019-10-18 NOTE — Telephone Encounter (Signed)
Spoke with patients son and gave him Dr Doristine Devoid recommendation and he voiced understanding.  He states the patients pcp was going to prescribe something for sleep and he will follow up with them for that medication.

## 2019-11-08 ENCOUNTER — Other Ambulatory Visit: Payer: Self-pay | Admitting: Neurology

## 2019-11-20 ENCOUNTER — Other Ambulatory Visit: Payer: Self-pay | Admitting: Neurology

## 2019-11-22 NOTE — Telephone Encounter (Signed)
Rx(s) sent to pharmacy electronically.  

## 2019-11-30 NOTE — Progress Notes (Signed)
Assessment/Plan:   1.  Parkinsons Disease  -Continue carbidopa/levodopa 25/100, 2 tablets at 6 AM/2 tablet at 10 AM/2 tablets at 2 PM/2 tablet at 6 PM  -Continue carbidopa/levodopa 50/200 at bedtime  -discussed with them again that while loss of taste may be associated with Parkinson's disease, "bad taste" or "poison taste" is not associated with Parkinson's disease.  I wonder if she is getting some reflux.  I told them to follow-up with primary care.    -We will send physical therapy to the home.  -Son asked about patient speech getting more slurry.  Discussed that this is associated with the disease, and the only thing that really helps his speech therapy.  This may be difficult in her given that English is not her primary language and I do not know any speech therapists that speak her primary language that we will be able to do LSVT loud with her.  Patient son expressed understanding, and he just wanted to make sure that the slurring of speech was associated with the disease.  It does wax and wane. 2. RBD  -Bedroom safety discussed. Wants no medication. Now has bedrails  -Son asked about pathophysiology and discussed that today.  3. Depression  -d/c lexapro (not taking regularly)  -start mirtazapine 15 mg q hs.    R/B/SE were discussed.  The opportunity to ask questions was given and they were answered to the best of my ability.  The patient expressed understanding and willingness to follow the outlined treatment protocols.   Subjective:   Katherine Novak was seen today in follow up for Parkinsons disease.  My previous records were reviewed prior to todays visit as well as outside records available to me. Son accompanies patient and supplements history. Pt denies falls.  Pt denies lightheadedness, near syncope.  Having vivid dreams.  Yelling out.  Sometimes will have feeling that something is around her in the day but no hallucinations.   Son called since last visit stating that patient  was having a bad taste in the mouth and they thought that was from Parkinson's disease. Explained that while Parkinson's can cause loss of appetite, does not cause a bad taste in the mouth and they needed to follow-up with primary care.  Son states today that she continues to complain about that.  They also wanted more medication for sleep. I told them I wanted to try melatonin first, as that has been well studied in Parkinson's disease and quite successfully. Instead, they stated that they would follow-up with primary care to get something from them for sleep.  She has gotten xanax from pcp for sleep - son states isn't taking it.  isnt taking the melatonin either.  Not as mobile.  Walks with walker in the home.    Current prescribed movement disorder medications: Carbidopa/levodopa 25/100, 2 tablets at 6 AM/1 tablet at 10 AM/2 tablets at 2 PM/1 tablet at 6 PM (can take an extra half tablet if wakes up in the middle of the night with tremor but she never does that) Carbidopa/levodopa 50/200 at bedtime  Lexapro, 10 mg (started last visit) - son states taking that one "on and off"    ALLERGIES:   Allergies  Allergen Reactions  . Sulfa Antibiotics     Angioedema of lips , throat    CURRENT MEDICATIONS:  Outpatient Encounter Medications as of 12/07/2019  Medication Sig  . acetaminophen (TYLENOL) 325 MG tablet Take 650 mg by mouth every 6 (six) hours as  needed.  Marland Kitchen alendronate (FOSAMAX) 70 MG tablet Take 70 mg by mouth every 7 (seven) days. Take with a full glass of water on an empty stomach.  On Wednesday  . carbidopa-levodopa (SINEMET CR) 50-200 MG tablet TAKE 1 TABLET BY MOUTH EVERYDAY AT BEDTIME  . carbidopa-levodopa (SINEMET IR) 25-100 MG tablet TAKE 2 TABLETS AT 6AM 1 TABLET AT 10AM 2 TABLETS AT 2PM 1 TABLET AT 6PM  . escitalopram (LEXAPRO) 10 MG tablet Take 1 tablet (10 mg total) by mouth daily.  . furosemide (LASIX) 20 MG tablet Take 20 mg by mouth daily as needed.  Marland Kitchen  HYDROcodone-acetaminophen (NORCO) 10-325 MG per tablet Take 1-2 tablets by mouth every 6 (six) hours as needed for moderate pain or severe pain.  . metoprolol tartrate (LOPRESSOR) 25 MG tablet Take 25 mg by mouth 2 (two) times daily.  Marland Kitchen omeprazole (PRILOSEC) 20 MG capsule Take 20 mg by mouth daily.  . rosuvastatin (CRESTOR) 5 MG tablet Take 5 mg by mouth daily.  . diclofenac sodium (VOLTAREN) 1 % GEL Apply topically 4 (four) times daily. (Patient not taking: Reported on 12/07/2019)   No facility-administered encounter medications on file as of 12/07/2019.    Objective:   PHYSICAL EXAMINATION:    VITALS:   Vitals:   12/07/19 1104  BP: 129/72  Pulse: 61  SpO2: 97%  Weight: 127 lb (57.6 kg)  Height: 4\' 11"  (1.499 m)    GEN:  The patient appears stated age and is in NAD. HEENT:  Normocephalic, atraumatic.  The mucous membranes are moist. The superficial temporal arteries are without ropiness or tenderness. CV:  RRR Lungs:  CTAB Neck/HEME:  There are no carotid bruits bilaterally.  Neurological examination:  Orientation: The patient is alert and oriented x3. Cranial nerves: There is good facial symmetry with facial hypomimia.  Patient does not speak my native language, so unable to assess fluency/clarity. Soft palate rises symmetrically and there is no tongue deviation. Hearing is intact to conversational tone. Sensation: Sensation is intact to light touch throughout Motor: Strength is at least antigravity x4.  Movement examination: Tone: There is normal tone in the upper and lower extremities Abnormal movements: Mild right upper extremity rest tremor Coordination:  There is slowness with rapid alternating movements in upper and lower extremities, but not necessarily decremation Gait and Station: The patient is assisted out of the chair.  She has some start hesitation.  She shuffles initially, but once out in the hall, she actually walks quite well with her cane and minimal  assistance from the examiner.   I have reviewed and interpreted the following labs independently    Chemistry      Component Value Date/Time   NA 140 08/11/2013 0620   NA 143 02/01/2013 0911   K 4.4 08/11/2013 0620   K 4.0 02/01/2013 0911   CL 104 08/11/2013 0620   CO2 23 08/11/2013 0620   CO2 24 02/01/2013 0911   BUN 15 08/11/2013 0620   BUN 11.5 02/01/2013 0911   CREATININE 0.54 08/11/2013 0620   CREATININE 0.7 02/01/2013 0911      Component Value Date/Time   CALCIUM 8.3 (L) 08/11/2013 0620   CALCIUM 9.4 02/01/2013 0911   ALKPHOS 80 02/01/2013 0911   AST 19 02/01/2013 0911   ALT 14 02/01/2013 0911   BILITOT 0.28 02/01/2013 0911       Lab Results  Component Value Date   WBC 8.9 08/13/2013   HGB 7.0 (L) 08/13/2013   HCT 22.7 (L)  08/13/2013   MCV 83.5 08/13/2013   PLT 141 (L) 08/13/2013    No results found for: TSH   Total time spent on today's visit was 45 minutes, including both face-to-face time and nonface-to-face time.  Time included that spent on review of records (prior notes available to me/labs/imaging if pertinent), discussing treatment and goals, answering patient's questions and coordinating care.  Cc:  [name], MD

## 2019-12-07 ENCOUNTER — Encounter: Payer: Self-pay | Admitting: Neurology

## 2019-12-07 ENCOUNTER — Ambulatory Visit (INDEPENDENT_AMBULATORY_CARE_PROVIDER_SITE_OTHER): Payer: Medicare Other | Admitting: Neurology

## 2019-12-07 ENCOUNTER — Other Ambulatory Visit: Payer: Self-pay

## 2019-12-07 VITALS — BP 129/72 | HR 61 | Ht 59.0 in | Wt 127.0 lb

## 2019-12-07 DIAGNOSIS — G4752 REM sleep behavior disorder: Secondary | ICD-10-CM

## 2019-12-07 DIAGNOSIS — G4701 Insomnia due to medical condition: Secondary | ICD-10-CM | POA: Diagnosis not present

## 2019-12-07 DIAGNOSIS — G20 Parkinson's disease: Secondary | ICD-10-CM

## 2019-12-07 MED ORDER — CARBIDOPA-LEVODOPA 25-100 MG PO TABS: 2.0000 | ORAL_TABLET | Freq: Four times a day (QID) | ORAL | 1 refills | Status: DC

## 2019-12-07 MED ORDER — MIRTAZAPINE 15 MG PO TABS: 15.0000 mg | ORAL_TABLET | Freq: Every day | ORAL | 1 refills | Status: DC

## 2019-12-07 NOTE — Patient Instructions (Addendum)
1.  Increase carbidopa/levodopa 25/100, 2 tablets at 6 AM/2 tablet at 10 AM/2 tablets at 2 PM/2 tablet at 6 PM  2.  Talk to your primary care about the bad taste in the mouth  3.  I will send physcial therapy to the home.  4.  Start mirtazapine 15 mg at bed for sleep  5.  Stop lexapro  6.  Continue carbidopa/levodopa 50/200 at bed  The physicians and staff at [facility] are committed to providing excellent care. You may receive a survey requesting feedback about your experience at our office. We strive to receive "very good" responses to the survey questions. If you feel that your experience would prevent you from giving the office a "very good " response, please contact our office to try to remedy the situation. We may be reached at [phone]. Thank you for taking the time out of your busy day to complete the survey.

## 2019-12-08 ENCOUNTER — Other Ambulatory Visit: Payer: Self-pay

## 2019-12-08 DIAGNOSIS — G20 Parkinson's disease: Secondary | ICD-10-CM

## 2019-12-09 ENCOUNTER — Other Ambulatory Visit: Payer: Self-pay

## 2019-12-09 ENCOUNTER — Emergency Department (HOSPITAL_COMMUNITY): Payer: Medicare Other

## 2019-12-09 ENCOUNTER — Observation Stay (HOSPITAL_COMMUNITY)
Admission: EM | Admit: 2019-12-09 | Discharge: 2019-12-10 | Disposition: A | Discharge status: Home / Self Care | Payer: Medicare Other | Attending: Internal Medicine | Admitting: Internal Medicine

## 2019-12-09 ENCOUNTER — Telehealth: Payer: Self-pay | Admitting: Neurology

## 2019-12-09 DIAGNOSIS — Z96653 Presence of artificial knee joint, bilateral: Secondary | ICD-10-CM | POA: Diagnosis not present

## 2019-12-09 DIAGNOSIS — I639 Cerebral infarction, unspecified: Principal | ICD-10-CM | POA: Diagnosis present

## 2019-12-09 DIAGNOSIS — G20 Parkinson's disease: Secondary | ICD-10-CM | POA: Insufficient documentation

## 2019-12-09 DIAGNOSIS — E7800 Pure hypercholesterolemia, unspecified: Secondary | ICD-10-CM | POA: Diagnosis present

## 2019-12-09 DIAGNOSIS — G20A1 Parkinson's disease without dyskinesia, without mention of fluctuations: Secondary | ICD-10-CM | POA: Diagnosis present

## 2019-12-09 DIAGNOSIS — I10 Essential (primary) hypertension: Secondary | ICD-10-CM | POA: Diagnosis not present

## 2019-12-09 DIAGNOSIS — Z20822 Contact with and (suspected) exposure to covid-19: Secondary | ICD-10-CM | POA: Diagnosis not present

## 2019-12-09 DIAGNOSIS — R531 Weakness: Secondary | ICD-10-CM | POA: Diagnosis present

## 2019-12-09 DIAGNOSIS — Z79899 Other long term (current) drug therapy: Secondary | ICD-10-CM | POA: Insufficient documentation

## 2019-12-09 LAB — COMPREHENSIVE METABOLIC PANEL
ALT: <5 U/L (ref 0–44)
AST: 13 U/L — ABNORMAL LOW (ref 15–41)
Albumin: 3.8 g/dL (ref 3.5–5.0)
Alkaline Phosphatase: 60 U/L (ref 38–126)
Anion gap: 10 (ref 5–15)
BUN: 8 mg/dL (ref 8–23)
CO2: 28 mmol/L (ref 22–32)
Calcium: 9.5 mg/dL (ref 8.9–10.3)
Chloride: 105 mmol/L (ref 98–111)
Creatinine, Ser: 0.68 mg/dL (ref 0.44–1.00)
GFR, Estimated: >60 mL/min (ref >60)
Glucose, Bld: 103 mg/dL — ABNORMAL HIGH (ref 70–99)
Potassium: 3.8 mmol/L (ref 3.5–5.1)
Sodium: 143 mmol/L (ref 135–145)
Total Bilirubin: 0.5 mg/dL (ref 0.3–1.2)
Total Protein: 6 g/dL — ABNORMAL LOW (ref 6.5–8.1)

## 2019-12-09 LAB — RESP PANEL BY RT-PCR (FLU A&B, COVID) ARPGX2
Influenza A by PCR: NEGATIVE
Influenza B by PCR: NEGATIVE
SARS Coronavirus 2 by RT PCR: NEGATIVE

## 2019-12-09 LAB — CBC WITH DIFFERENTIAL/PLATELET
Abs Immature Granulocytes: 0.01 10*3/uL (ref 0.00–0.07)
Basophils Absolute: 0 10*3/uL (ref 0.0–0.1)
Basophils Relative: 1 %
Eosinophils Absolute: 0 10*3/uL (ref 0.0–0.5)
Eosinophils Relative: 1 %
HCT: 36.7 % (ref 36.0–46.0)
Hemoglobin: 10.8 g/dL — ABNORMAL LOW (ref 12.0–15.0)
Immature Granulocytes: 0 %
Lymphocytes Relative: 34 %
Lymphs Abs: 1.9 10*3/uL (ref 0.7–4.0)
MCH: 25.4 pg — ABNORMAL LOW (ref 26.0–34.0)
MCHC: 29.4 g/dL — ABNORMAL LOW (ref 30.0–36.0)
MCV: 86.4 fL (ref 80.0–100.0)
Monocytes Absolute: 0.4 10*3/uL (ref 0.1–1.0)
Monocytes Relative: 7 %
Neutro Abs: 3.1 10*3/uL (ref 1.7–7.7)
Neutrophils Relative %: 57 %
Platelets: 168 10*3/uL (ref 150–400)
RBC: 4.25 MIL/uL (ref 3.87–5.11)
RDW: 14.8 % (ref 11.5–15.5)
WBC: 5.4 10*3/uL (ref 4.0–10.5)
nRBC: 0 % (ref 0.0–0.2)

## 2019-12-09 IMAGING — CT CT HEAD CODE STROKE
4 series · 17 of 47 positions shown, 19 images · non-contrast
Comparison: CT head [DATE]

CLINICAL DATA: Code stroke.  Acute neuro deficit.

EXAM:
CT HEAD WITHOUT CONTRAST
TECHNIQUE: Contiguous axial images were obtained from the base of the skull
through the vertex without intravenous contrast.

[Series 3: head wo · axial · 0.45mm/px · z∈[-118,+2]mm · 7 of 34 slices shown, 9 images]
[im 5/34  brain]
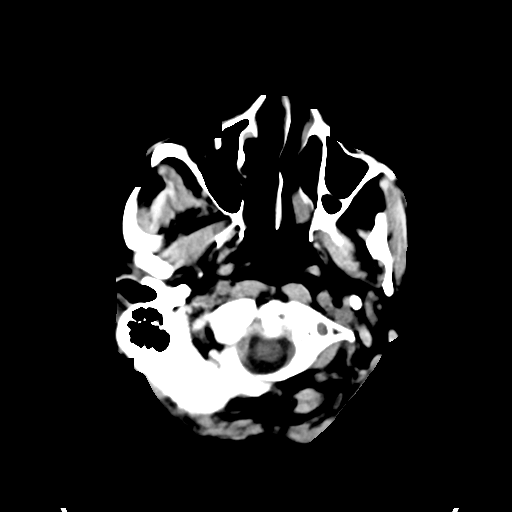
[im 5/34  bone]
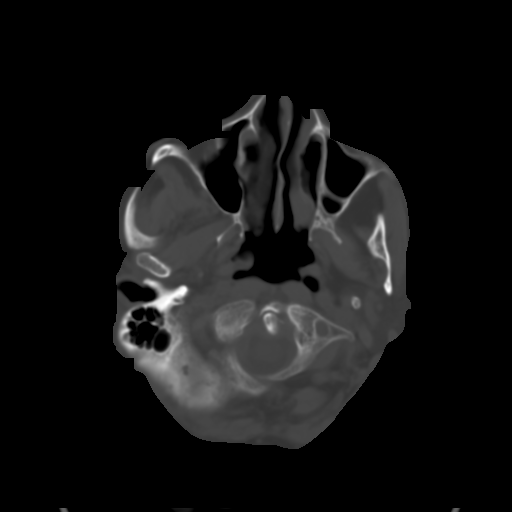
[im 9/34  brain]
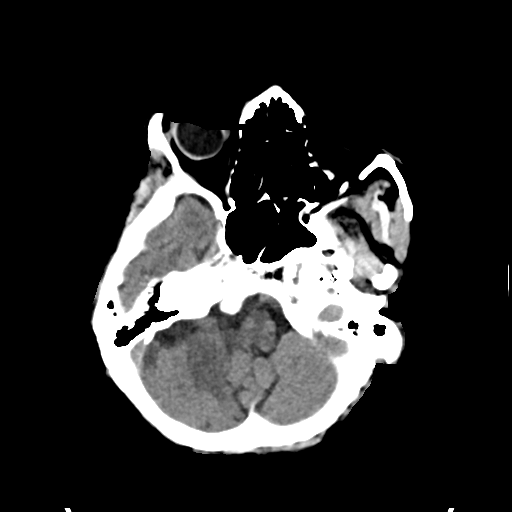
[im 13/34  brain]
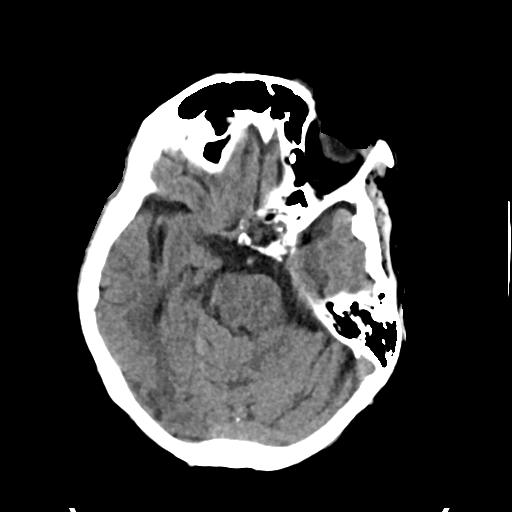
[im 17/34  brain]
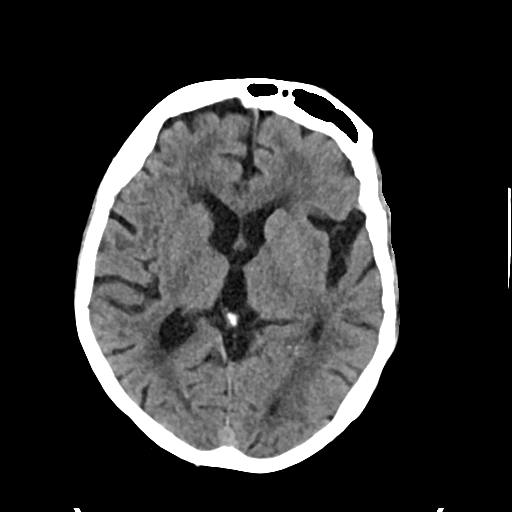
[im 21/34  brain]
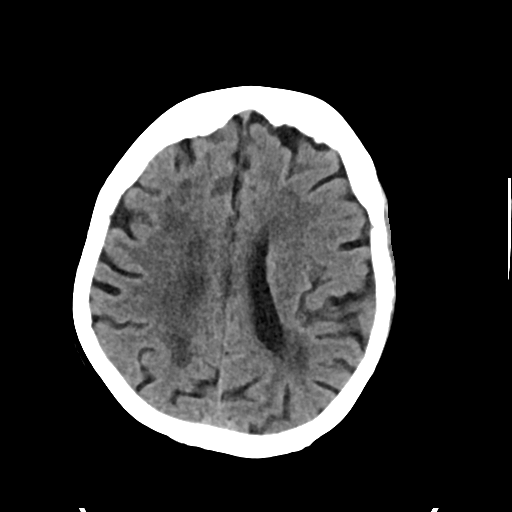
[im 21/34  bone]
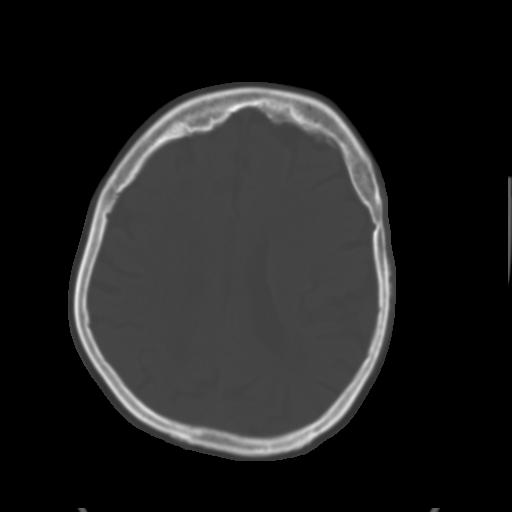
[im 25/34  brain]
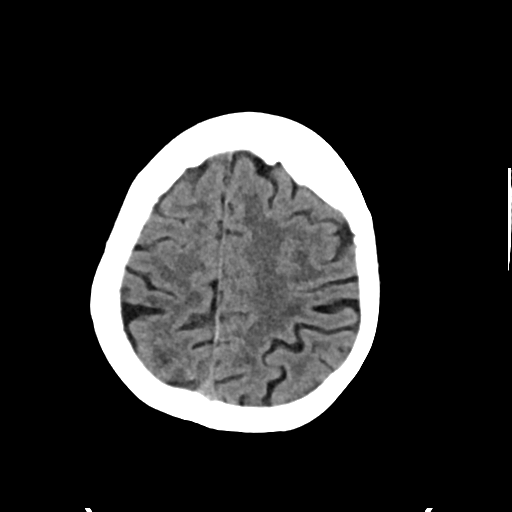
[im 29/34  brain]
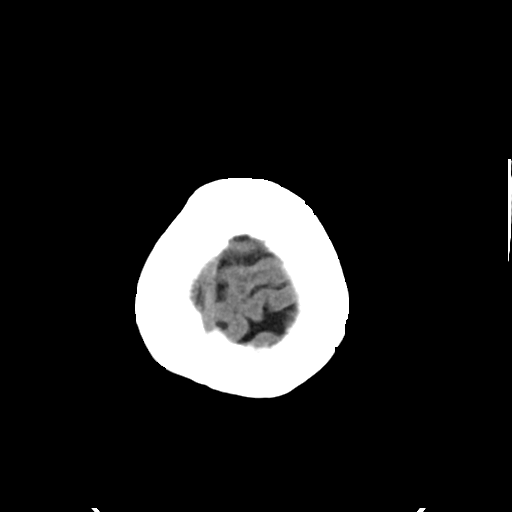

[Series 4: head bone · axial · 0.45mm/px · z∈[-122,-66]mm · 4 of 83 slices shown]
[im 9/83  bone]
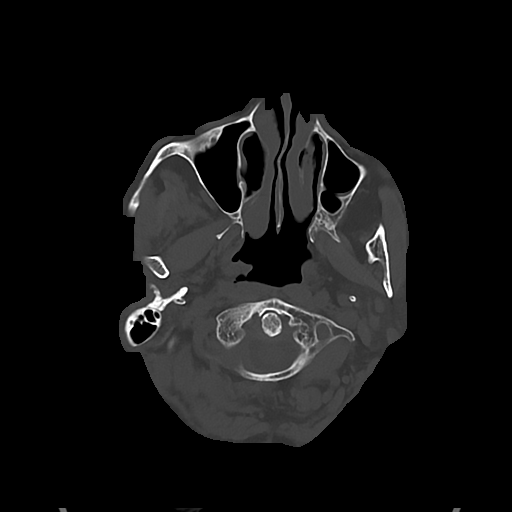
[im 17/83  bone]
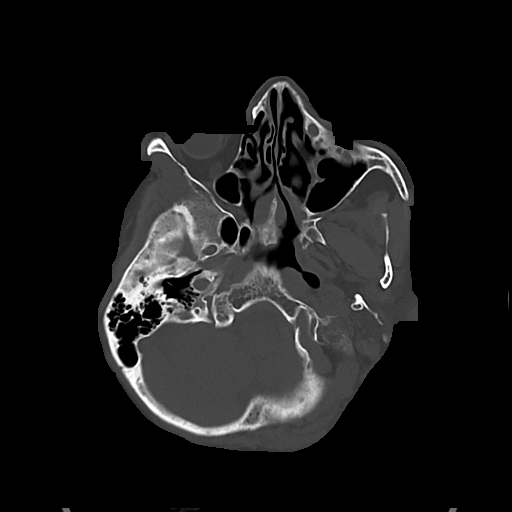
[im 25/83  bone]
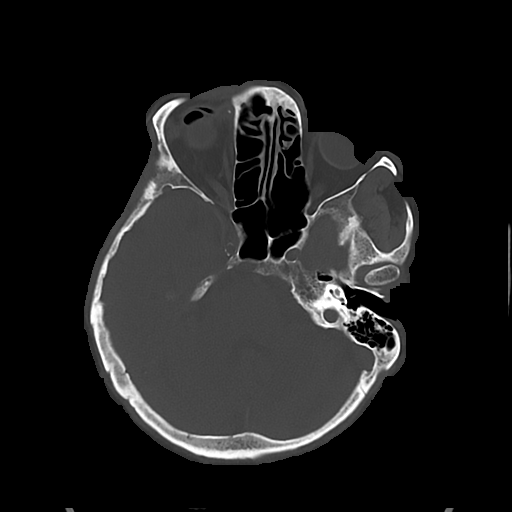
[im 37/83  bone]
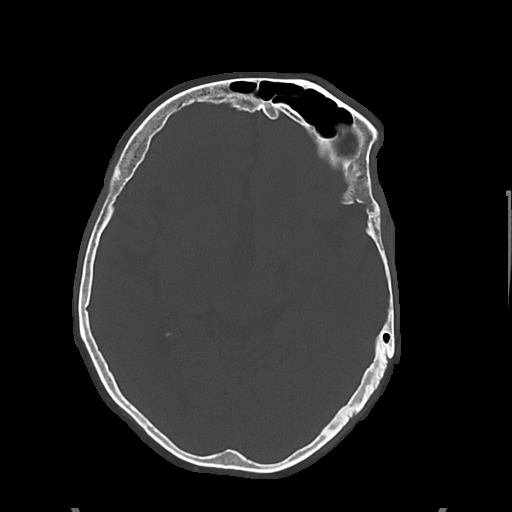

[Series 5: cor soft · coronal · 0.33mm/px · 3 of 65 slices shown]
[im 22/65  brain]
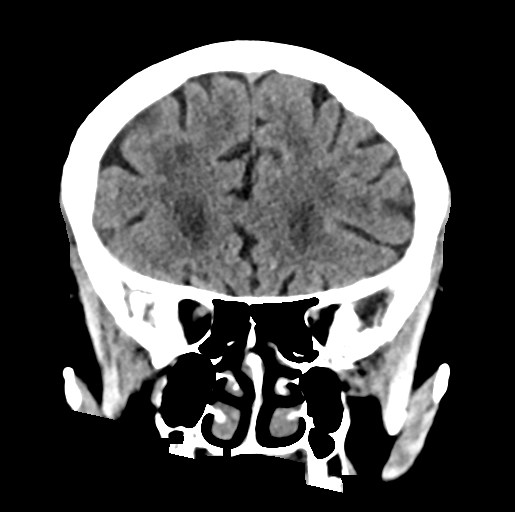
[im 29/65  brain]
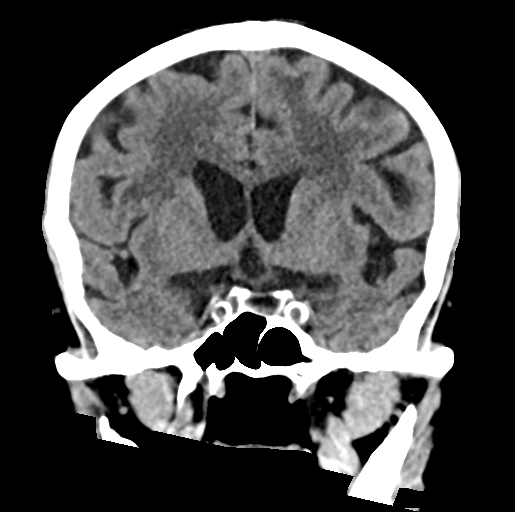
[im 36/65  brain]
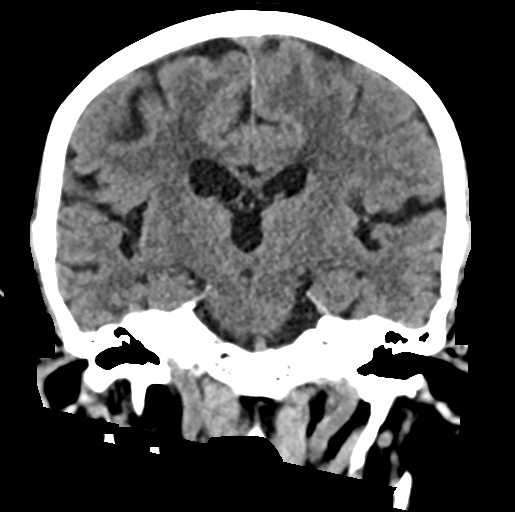

[Series 6: sag soft · sagittal · 0.33mm/px · 3 of 57 slices shown]
[im 24/57  brain]
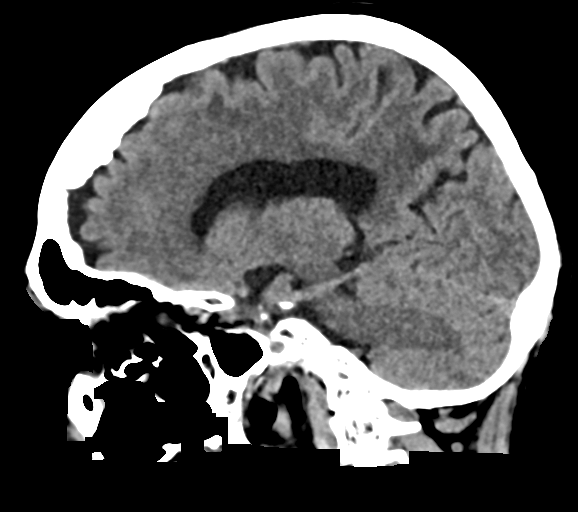
[im 29/57  brain]
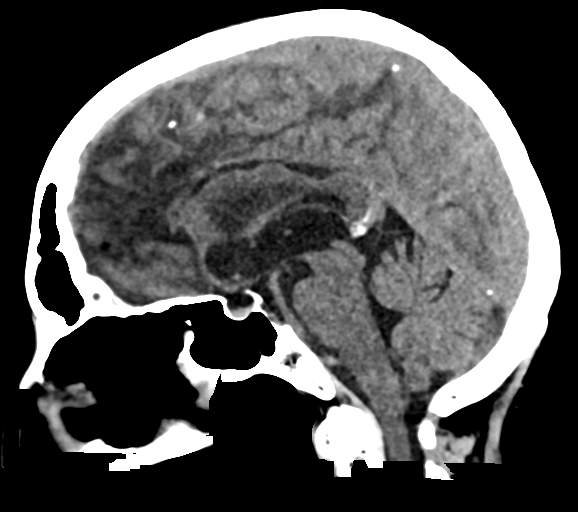
[im 34/57  brain]
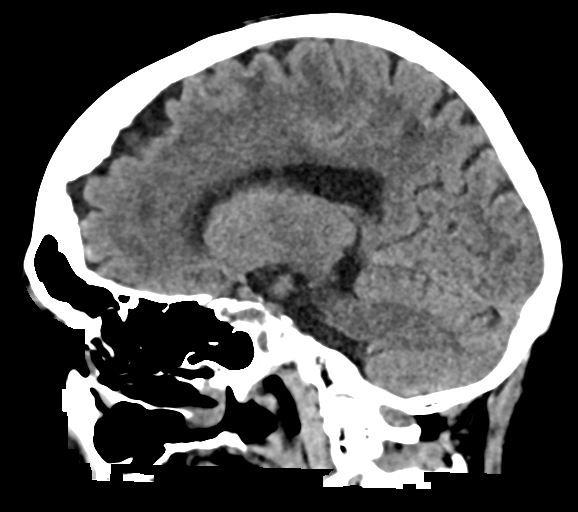

[17 of 47 positions shown; findings below may reference images not displayed]

FINDINGS: Brain: Generalized atrophy with progression. Moderate to extensive
white matter disease bilaterally with progression. Small chronic
infarct right occipital lobe has developed since the prior study.
Negative for hydrocephalus.

Negative for acute infarct, hemorrhage, mass.

Vascular: Negative for hyperdense vessel

Skull: Negative

Sinuses/Orbits: Mild mucosal edema paranasal sinuses. Bilateral
cataract extraction

Other: None

ASPECTS (Alberta Stroke Program Early CT Score)

- Ganglionic level infarction (caudate, lentiform nuclei, internal
capsule, insula, M1-M3 cortex): 7

- Supraganglionic infarction (M4-M6 cortex): 3

Total score (0-10 with 10 being normal): 10
IMPRESSION: 1. No acute intracranial abnormality
2. ASPECTS is 10
3. Atrophy and chronic ischemic changes with progression since [DATE]. Code stroke imaging results were communicated on [DATE] at
[DATE] to provider WAH MING via amion

## 2019-12-09 IMAGING — MR MR MRA HEAD W/O CM
9 of 13 series · 29 of 48 positions shown · non-contrast
Comparison: None.

CLINICAL DATA: Stroke follow-up



[Series 4: DWI · axial · 3.0mm · 1.09mm/px · z∈[-35,+105]mm · 5 of 96 slices shown (1 of 4)]
[im 1/96]
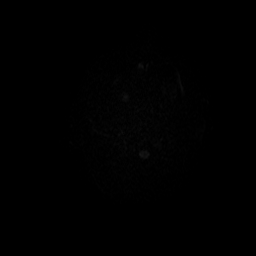
[im 24/96]
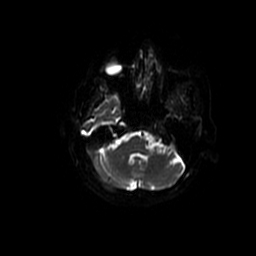
[im 48/96]
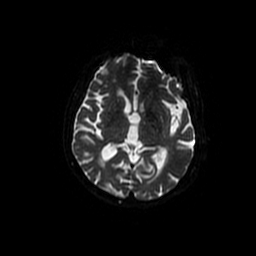
[im 72/96]
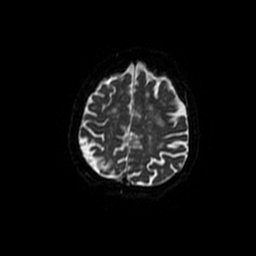
[im 96/96]
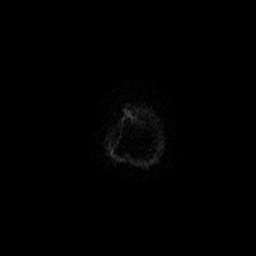

[Series 5: DWI · coronal · 5.0mm · 1.09mm/px · 5 of 70 slices shown (2 of 4)]
[im 1/70]
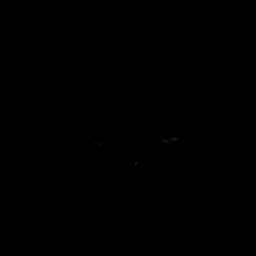
[im 18/70]
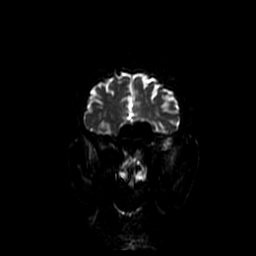
[im 35/70]
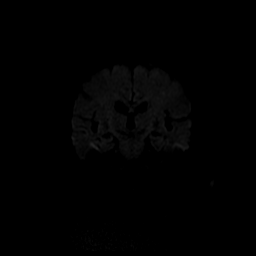
[im 52/70]
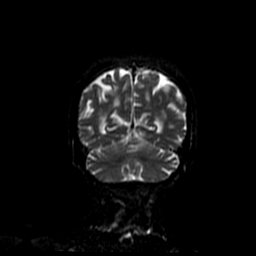
[im 70/70]
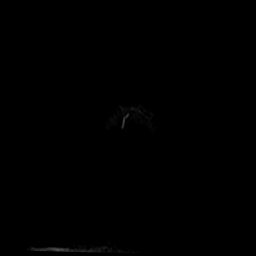

[Series 6: T1 · sagittal · 5.0mm · 0.47mm/px · 2 of 26 slices shown (1 of 2)]
[im 1/26]
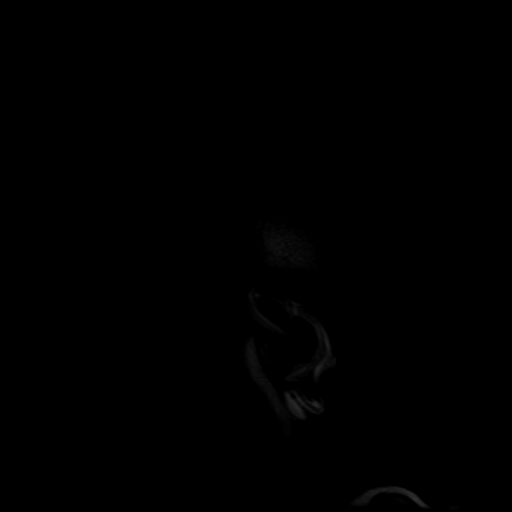
[im 26/26]
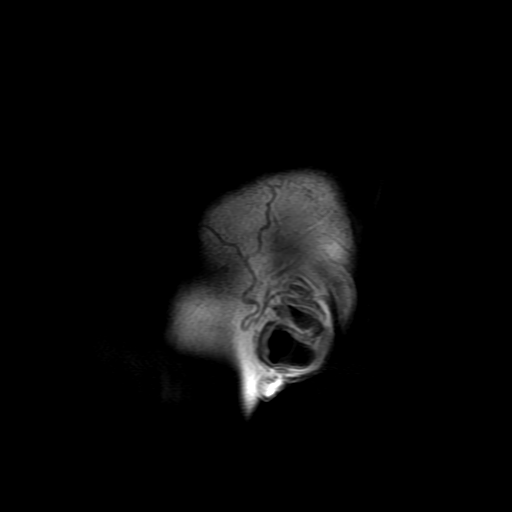

[Series 7: T2 · axial · 5.0mm · 0.43mm/px · z∈[-43,+93]mm · 2 of 24 slices shown (1 of 2)]
[im 1/24]
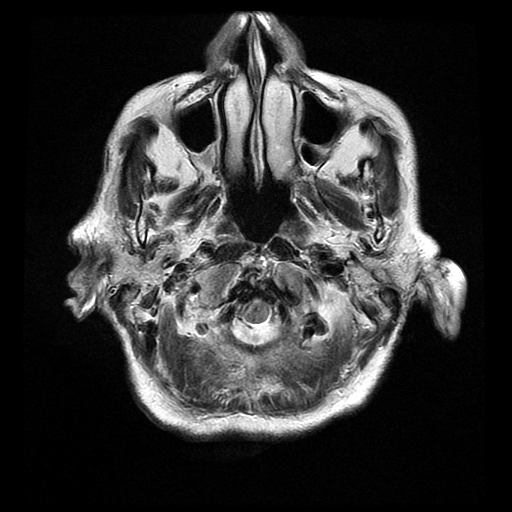
[im 24/24]
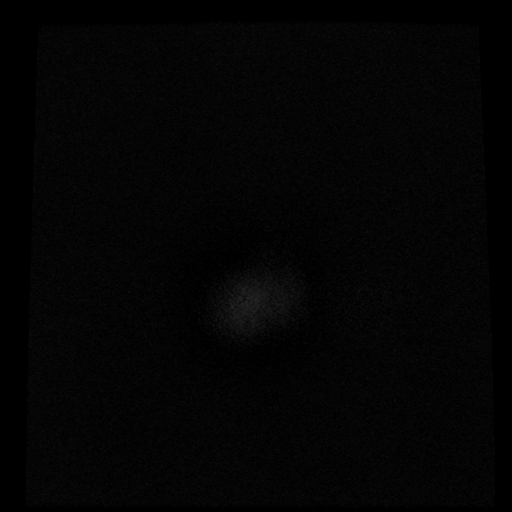

[Series 8: FLAIR · axial · 3.0mm · 0.43mm/px · z∈[-43,+98]mm · 2 of 25 slices shown]
[im 1/25]
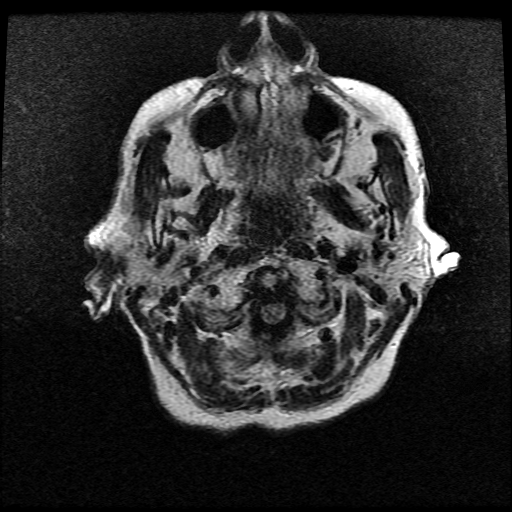
[im 25/25]
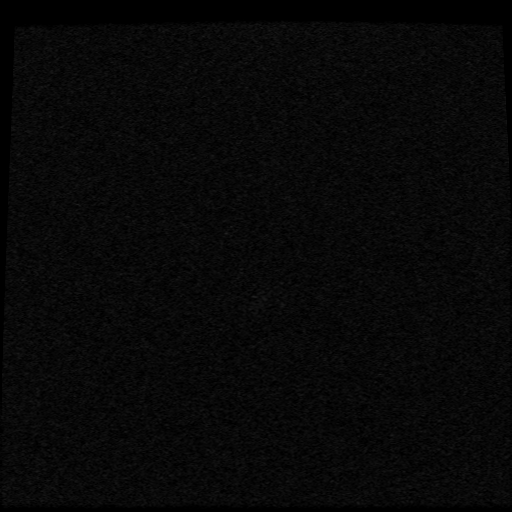

[Series 10: T1 · axial · 3.0mm · 0.47mm/px · z∈[-53,+67]mm · 6 of 100 slices shown (2 of 2)]
[im 1/100]
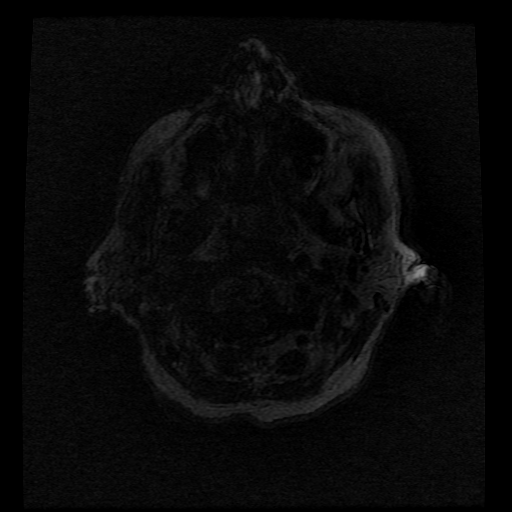
[im 17/100]
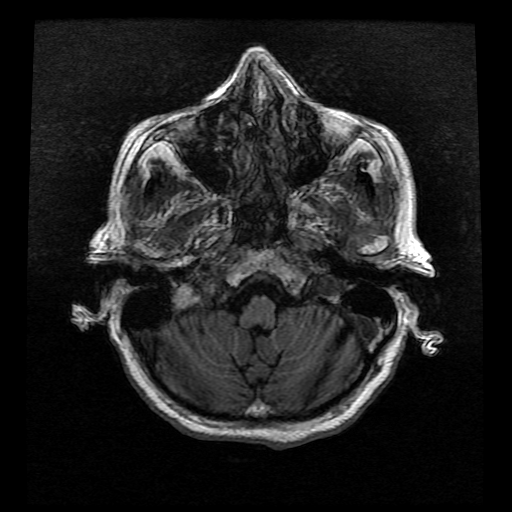
[im 34/100]
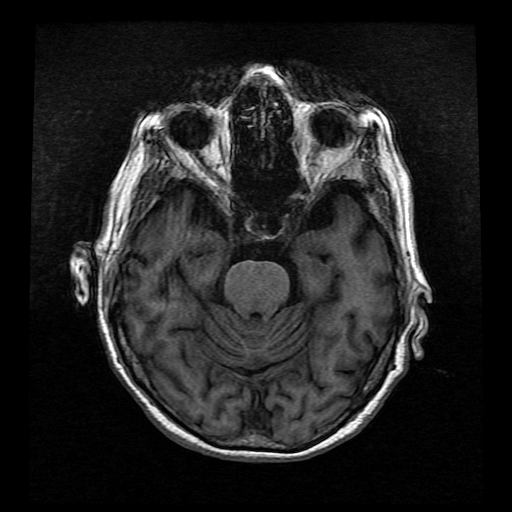
[im 50/100]
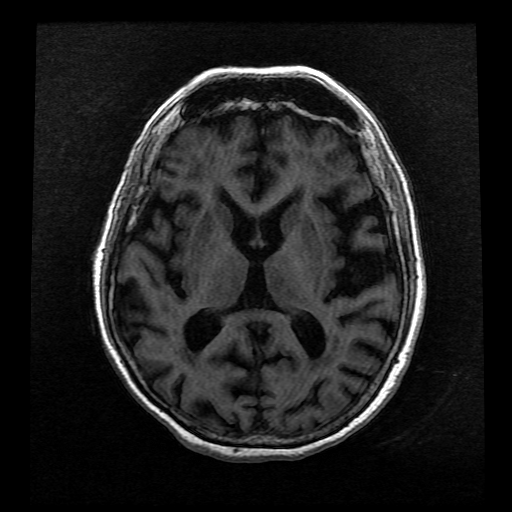
[im 67/100]
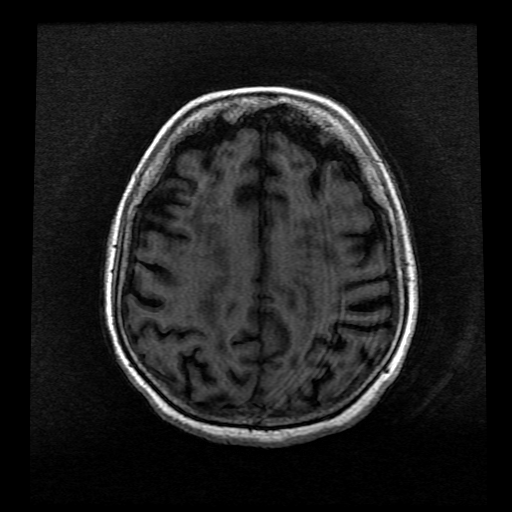
[im 83/100]
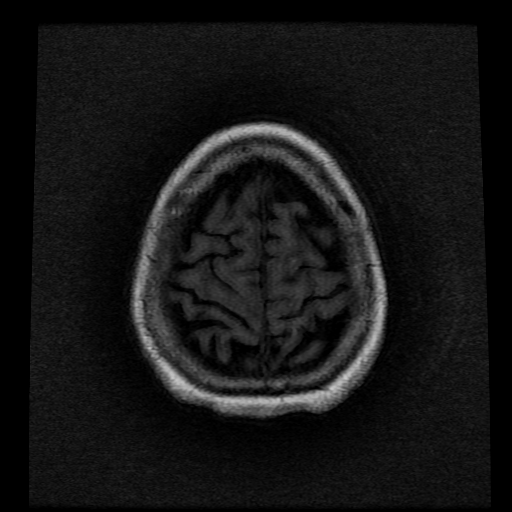

[Series 11: T2 · coronal · 5.0mm · 0.39mm/px · 2 of 27 slices shown (2 of 2)]
[im 1/27]
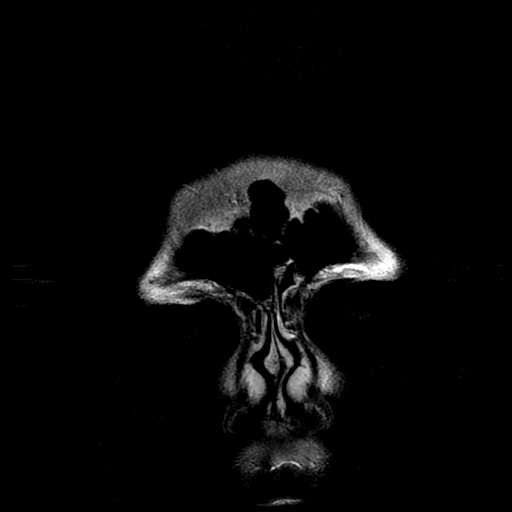
[im 27/27]
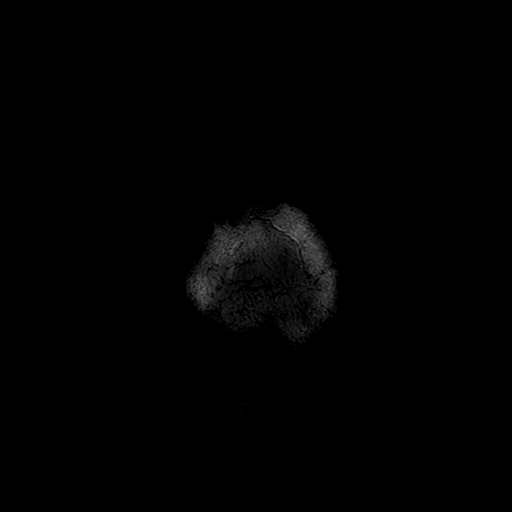

[Series 400: DWI · axial · 3.0mm · 1.09mm/px · z∈[-35,+105]mm · 3 of 48 slices shown (3 of 4)]
[im 1/48]
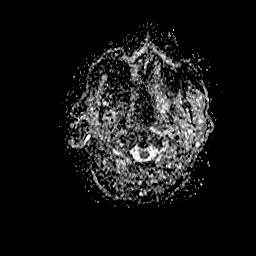
[im 24/48]
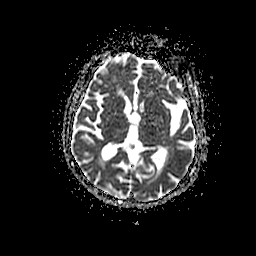
[im 48/48]
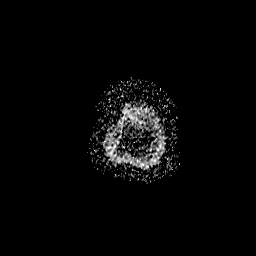

[Series 500: DWI · coronal · 5.0mm · 1.09mm/px · 2 of 35 slices shown (4 of 4)]
[im 1/35]
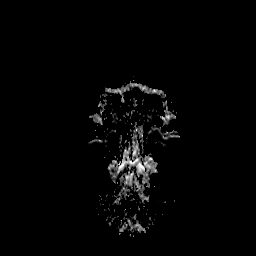
[im 35/35]
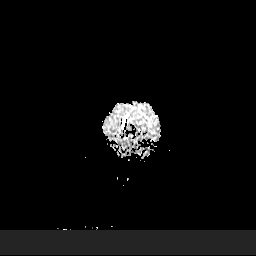

[29 of 48 positions shown; findings below may reference images not displayed]

FINDINGS: MRI HEAD FINDINGS

Brain: There is a small focus of abnormal diffusion restriction
within the left frontal white matter. No other diffusion
abnormality. Early confluent hyperintense T2-weighted signal of the
periventricular and deep white matter. Generalized volume loss. Old
right cerebellar infarcts. No chronic microhemorrhage. Normal
midline structures.

Vascular: Major flow voids are preserved.

Skull and upper cervical spine: Normal calvarium and skull base.
Visualized upper cervical spine and soft tissues are normal.

Sinuses/Orbits:No paranasal sinus fluid levels or advanced mucosal
thickening. No mastoid or middle ear effusion. Normal orbits.

MRA HEAD FINDINGS

POSTERIOR CIRCULATION:

--Vertebral arteries: Normal

--Inferior cerebellar arteries: Normal.

--Basilar artery: Normal.

--Superior cerebellar arteries: Normal.

--Posterior cerebral arteries: Normal.

ANTERIOR CIRCULATION:

--Intracranial internal carotid arteries: Normal.

--Anterior cerebral arteries (ACA): Normal.

--Middle cerebral arteries (MCA): Irregularity of the right MCA is
likely exaggerated by motion. Normal left MCA.

ANATOMIC VARIANTS: None

MRA NECK FINDINGS

No carotid stenosis. The visualized portions of the vertebral
arteries are patent.
IMPRESSION: 1. Small focus of acute ischemia within the left frontal white
matter. No hemorrhage or mass effect.
2. No emergent large vessel occlusion or high-grade stenosis in the
head or neck.

## 2019-12-09 IMAGING — MR MR HEAD W/O CM
9 of 12 series · 29 of 48 positions shown · non-contrast
Comparison: None.

CLINICAL DATA: Stroke follow-up



[Series 4: DWI · axial · 3.0mm · 1.09mm/px · z∈[-35,+105]mm · 6 of 96 slices shown (1 of 4)]
[im 1/96]
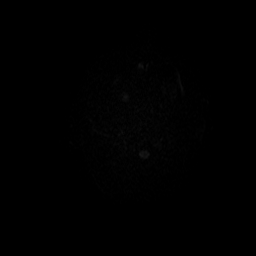
[im 20/96]
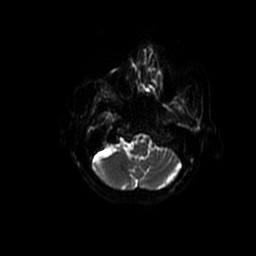
[im 39/96]
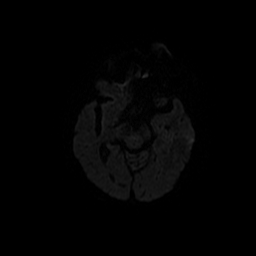
[im 58/96]
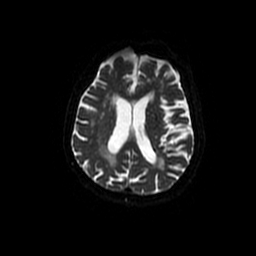
[im 77/96]
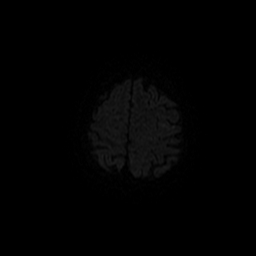
[im 96/96]
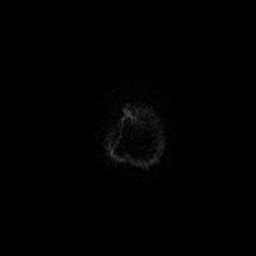

[Series 5: DWI · coronal · 5.0mm · 1.09mm/px · 5 of 70 slices shown (2 of 4)]
[im 1/70]
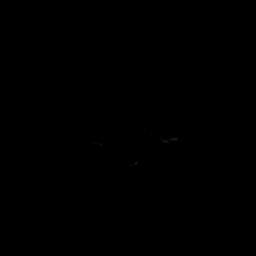
[im 18/70]
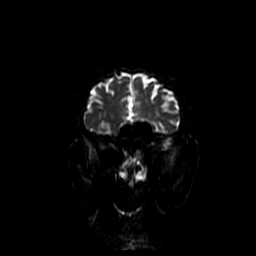
[im 35/70]
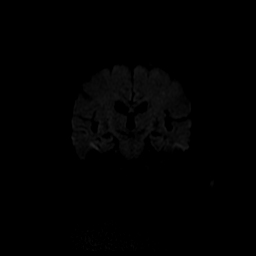
[im 52/70]
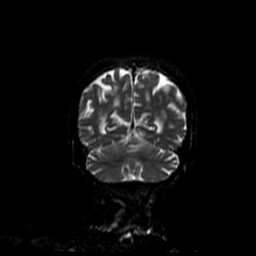
[im 70/70]
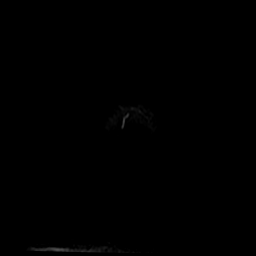

[Series 6: T1 · sagittal · 5.0mm · 0.47mm/px · 2 of 26 slices shown (1 of 2)]
[im 1/26]
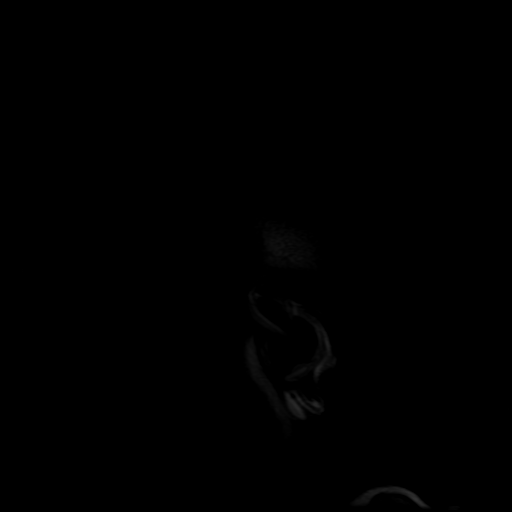
[im 26/26]
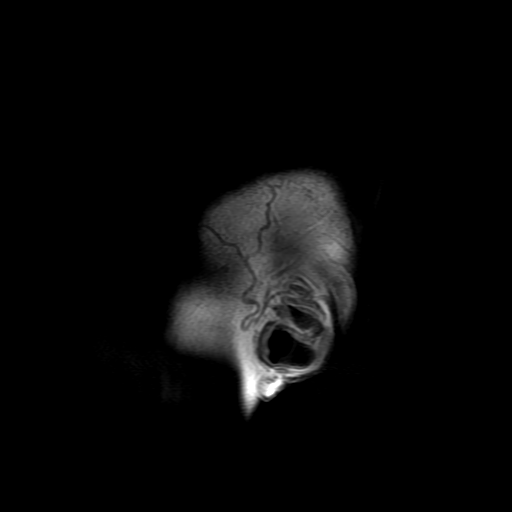

[Series 7: T2 · axial · 5.0mm · 0.43mm/px · z∈[-43,+93]mm · 2 of 24 slices shown (1 of 2)]
[im 1/24]
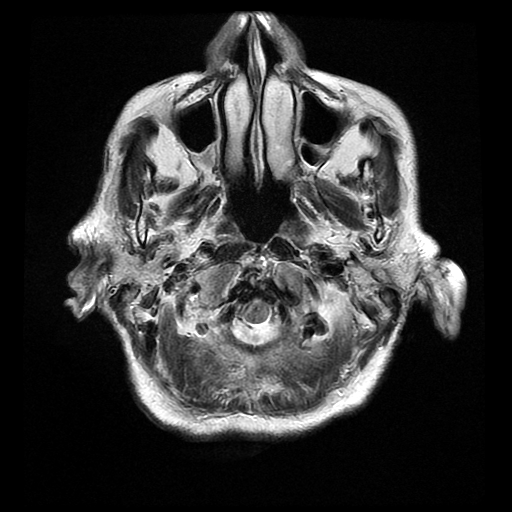
[im 24/24]
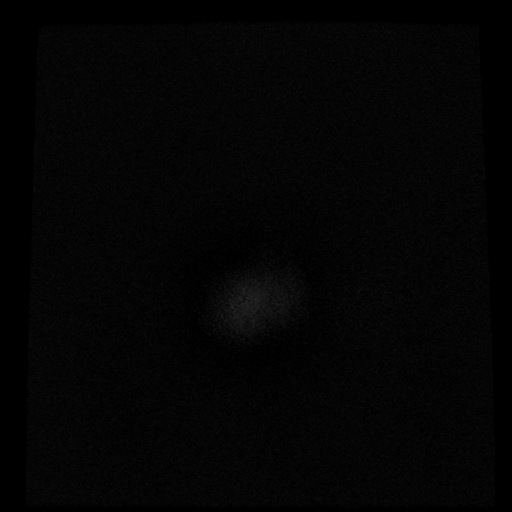

[Series 8: FLAIR · axial · 3.0mm · 0.43mm/px · z∈[-43,+98]mm · 2 of 25 slices shown]
[im 1/25]
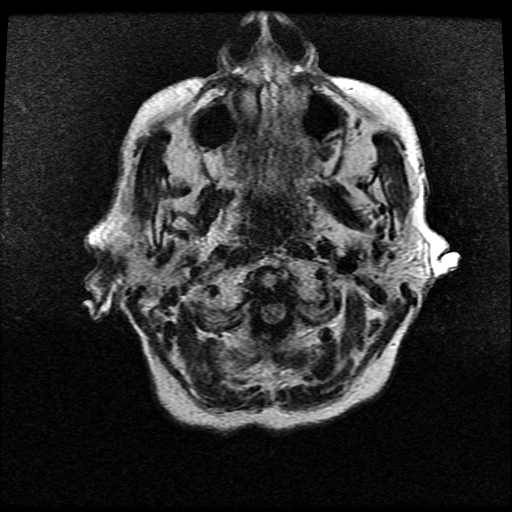
[im 25/25]
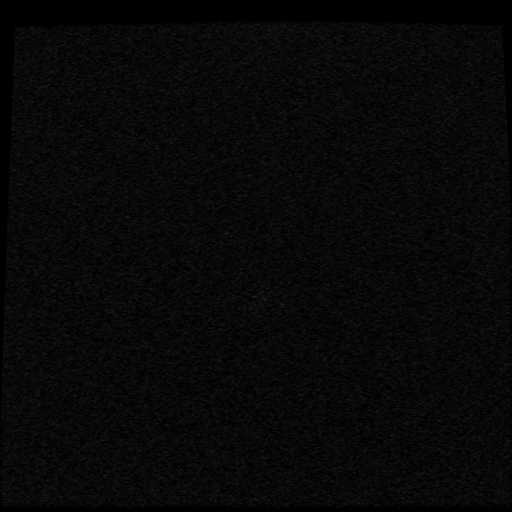

[Series 10: T1 · axial · 3.0mm · 0.47mm/px · z∈[-53,+44]mm · 5 of 100 slices shown (2 of 2)]
[im 1/100]
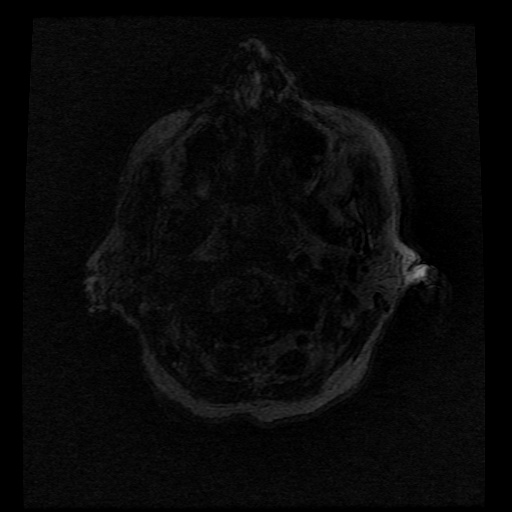
[im 17/100]
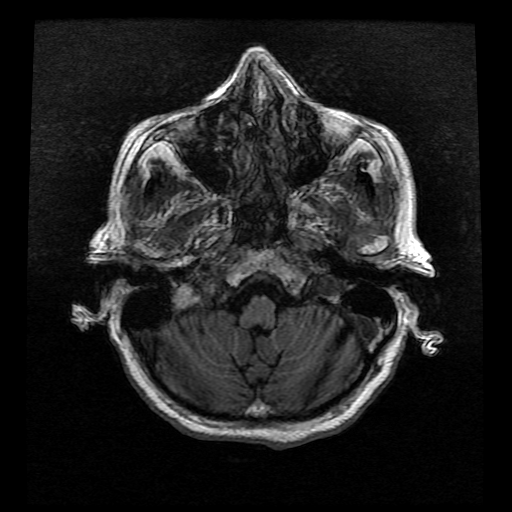
[im 34/100]
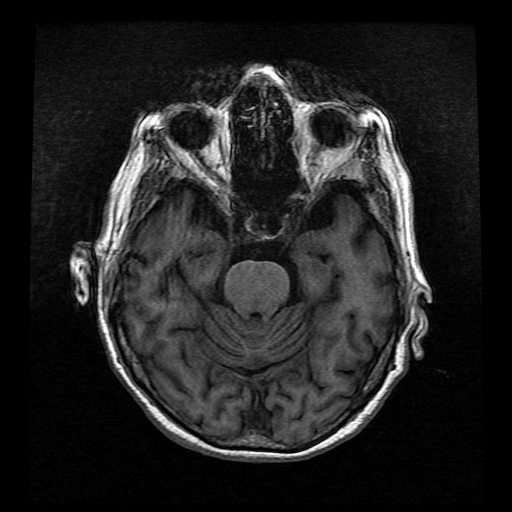
[im 50/100]
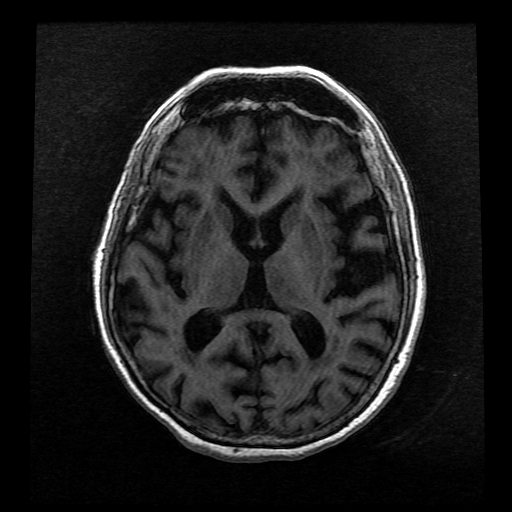
[im 67/100]
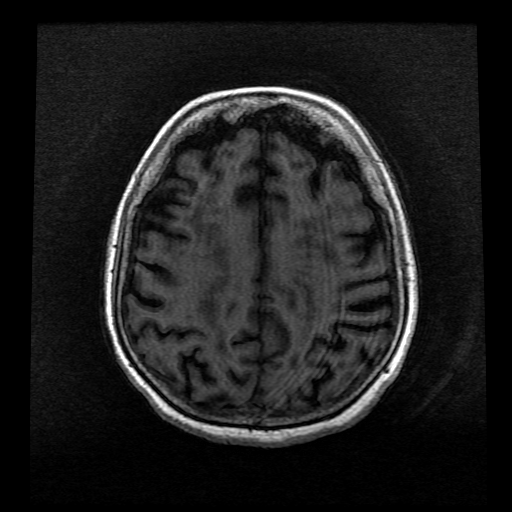

[Series 11: T2 · coronal · 5.0mm · 0.39mm/px · 2 of 27 slices shown (2 of 2)]
[im 1/27]
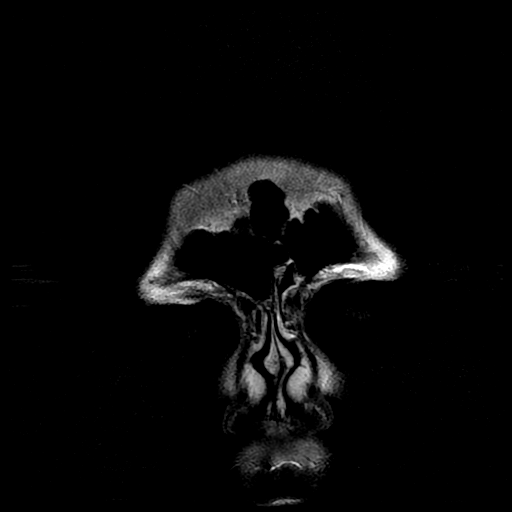
[im 27/27]
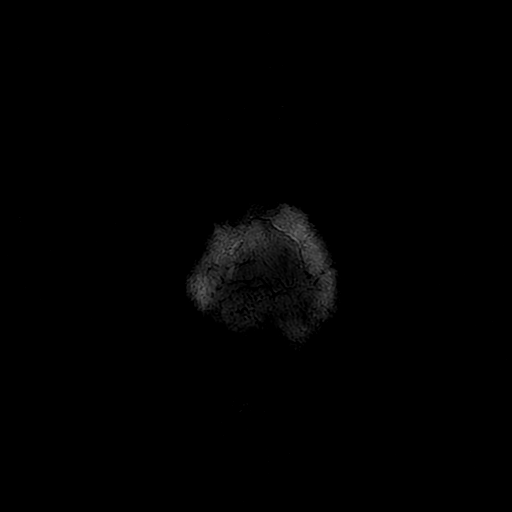

[Series 400: DWI · axial · 3.0mm · 1.09mm/px · z∈[-35,+105]mm · 3 of 48 slices shown (3 of 4)]
[im 1/48]
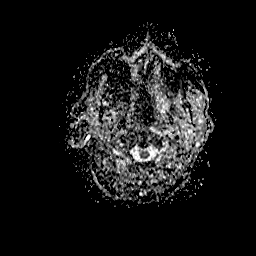
[im 24/48]
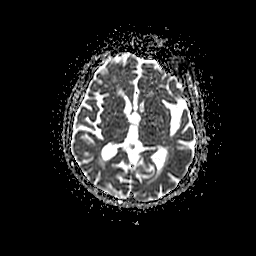
[im 48/48]
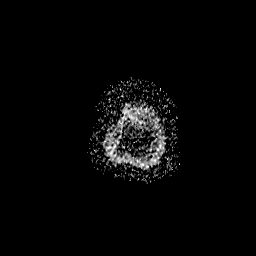

[Series 500: DWI · coronal · 5.0mm · 1.09mm/px · 2 of 35 slices shown (4 of 4)]
[im 1/35]
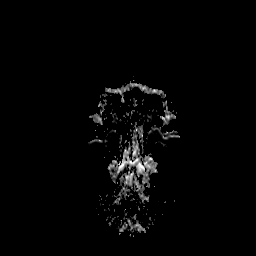
[im 35/35]
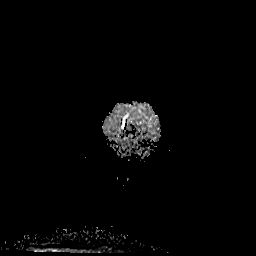

[29 of 48 positions shown; findings below may reference images not displayed]

FINDINGS: MRI HEAD FINDINGS

Brain: There is a small focus of abnormal diffusion restriction
within the left frontal white matter. No other diffusion
abnormality. Early confluent hyperintense T2-weighted signal of the
periventricular and deep white matter. Generalized volume loss. Old
right cerebellar infarcts. No chronic microhemorrhage. Normal
midline structures.

Vascular: Major flow voids are preserved.

Skull and upper cervical spine: Normal calvarium and skull base.
Visualized upper cervical spine and soft tissues are normal.

Sinuses/Orbits:No paranasal sinus fluid levels or advanced mucosal
thickening. No mastoid or middle ear effusion. Normal orbits.

MRA HEAD FINDINGS

POSTERIOR CIRCULATION:

--Vertebral arteries: Normal

--Inferior cerebellar arteries: Normal.

--Basilar artery: Normal.

--Superior cerebellar arteries: Normal.

--Posterior cerebral arteries: Normal.

ANTERIOR CIRCULATION:

--Intracranial internal carotid arteries: Normal.

--Anterior cerebral arteries (ACA): Normal.

--Middle cerebral arteries (MCA): Irregularity of the right MCA is
likely exaggerated by motion. Normal left MCA.

ANATOMIC VARIANTS: None

MRA NECK FINDINGS

No carotid stenosis. The visualized portions of the vertebral
arteries are patent.
IMPRESSION: 1. Small focus of acute ischemia within the left frontal white
matter. No hemorrhage or mass effect.
2. No emergent large vessel occlusion or high-grade stenosis in the
head or neck.

## 2019-12-09 IMAGING — MR MR MRA NECK W/O CM
9 of 12 series · 29 of 48 positions shown · non-contrast
Comparison: None.

CLINICAL DATA: Stroke follow-up



[Series 4: DWI · axial · 3.0mm · 1.09mm/px · z∈[-35,+105]mm · 6 of 96 slices shown (1 of 4)]
[im 1/96]
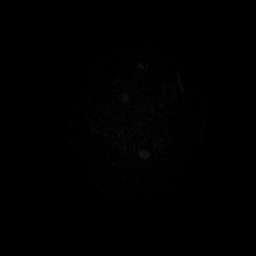
[im 20/96]
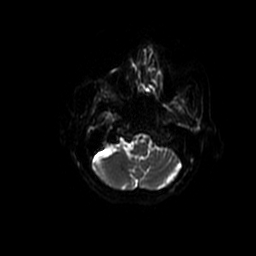
[im 39/96]
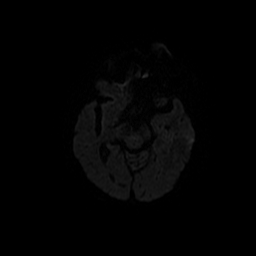
[im 58/96]
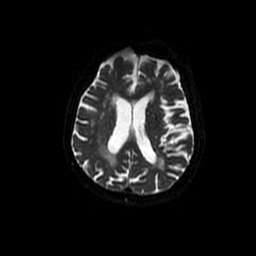
[im 77/96]
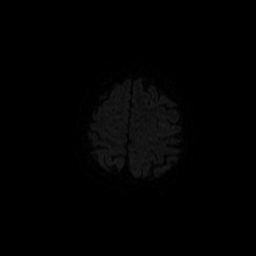
[im 96/96]
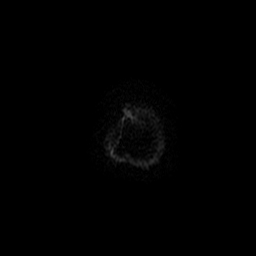

[Series 5: DWI · coronal · 5.0mm · 1.09mm/px · 5 of 70 slices shown (2 of 4)]
[im 1/70]
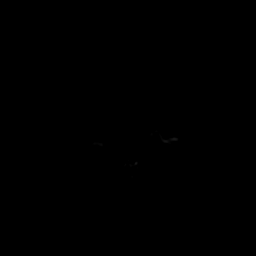
[im 18/70]
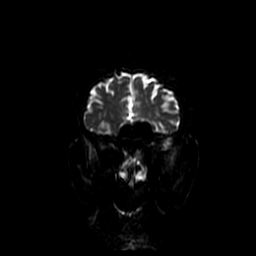
[im 35/70]
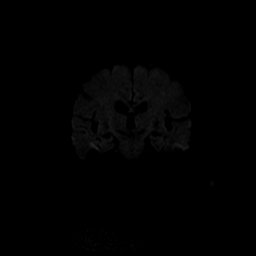
[im 52/70]
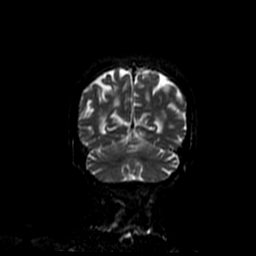
[im 70/70]
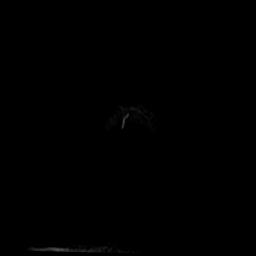

[Series 6: T1 · sagittal · 5.0mm · 0.47mm/px · 2 of 26 slices shown (1 of 2)]
[im 1/26]
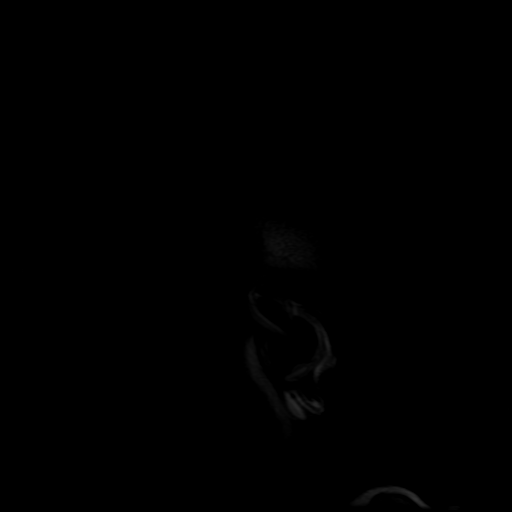
[im 26/26]
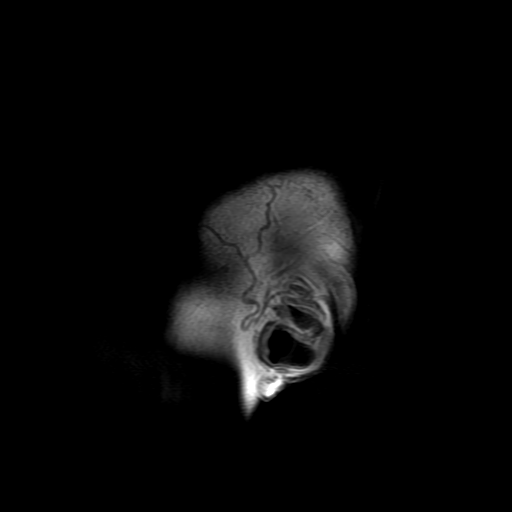

[Series 7: T2 · axial · 5.0mm · 0.43mm/px · z∈[-43,+93]mm · 2 of 24 slices shown (1 of 2)]
[im 1/24]
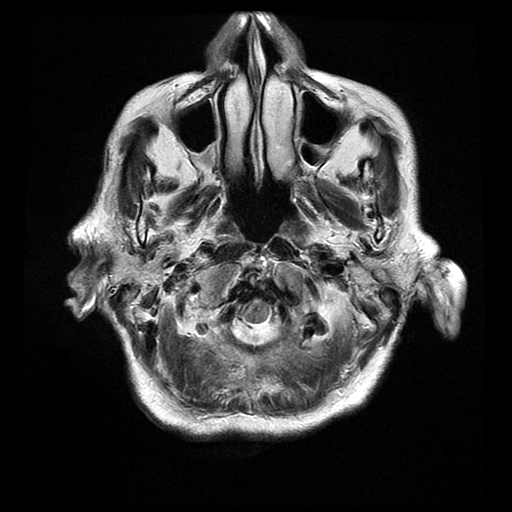
[im 24/24]
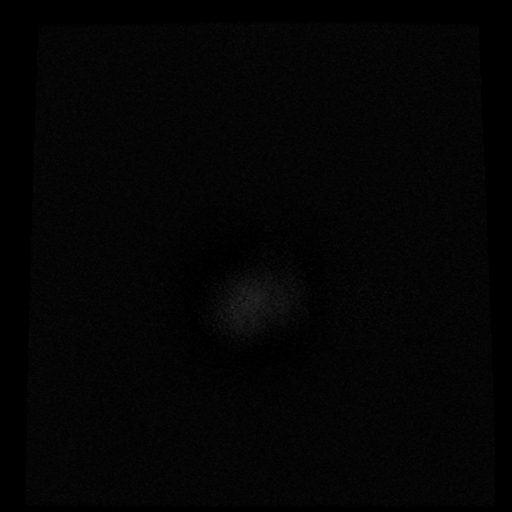

[Series 8: FLAIR · axial · 3.0mm · 0.43mm/px · z∈[-43,+98]mm · 2 of 25 slices shown]
[im 1/25]
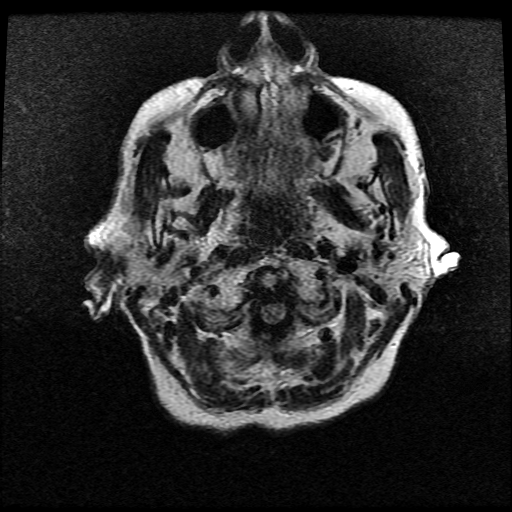
[im 25/25]
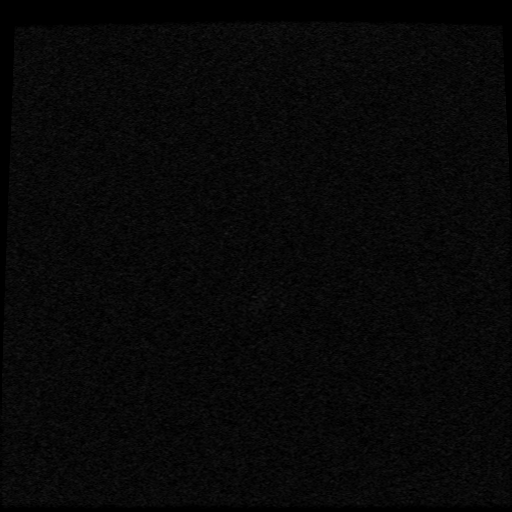

[Series 10: T1 · axial · 3.0mm · 0.47mm/px · z∈[-53,+44]mm · 5 of 100 slices shown (2 of 2)]
[im 1/100]
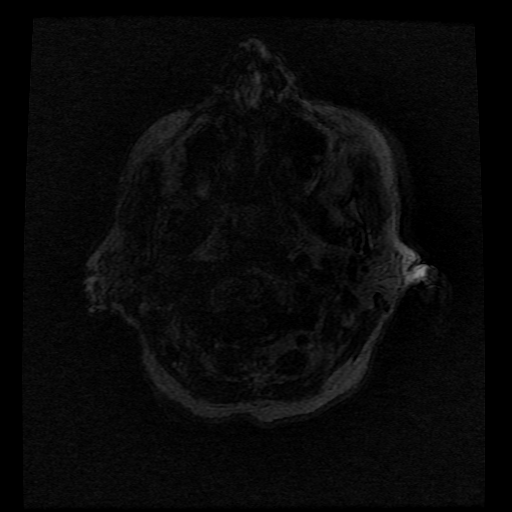
[im 17/100]
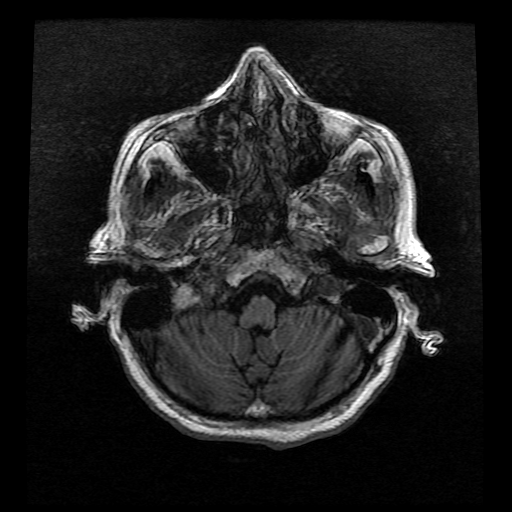
[im 34/100]
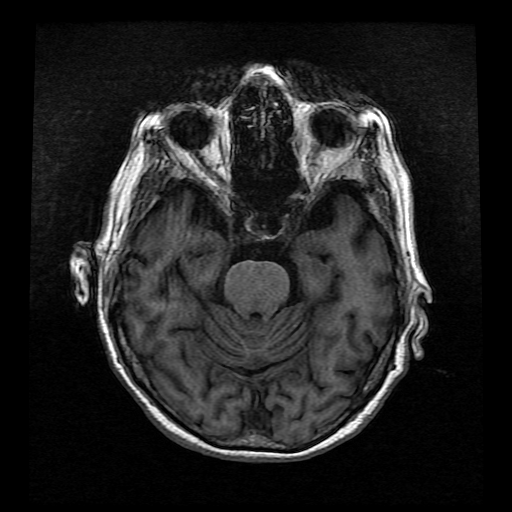
[im 50/100]
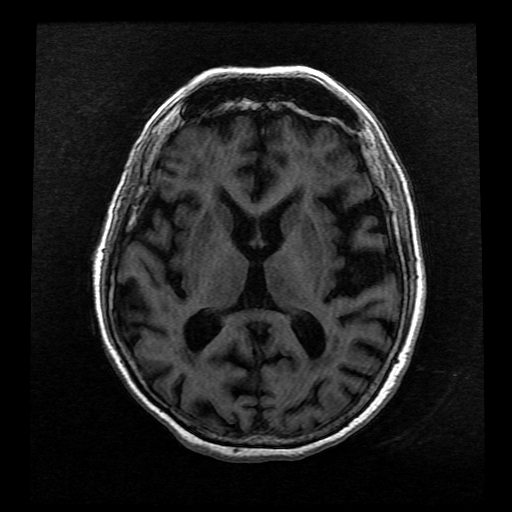
[im 67/100]
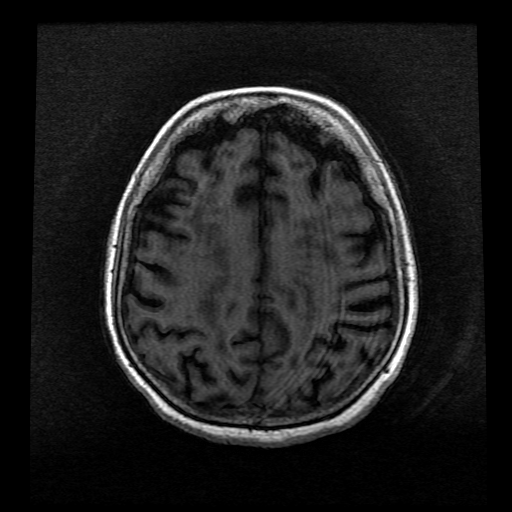

[Series 11: T2 · coronal · 5.0mm · 0.39mm/px · 2 of 27 slices shown (2 of 2)]
[im 1/27]
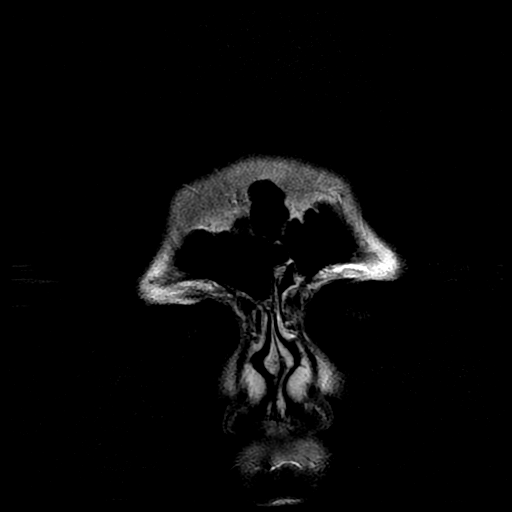
[im 27/27]
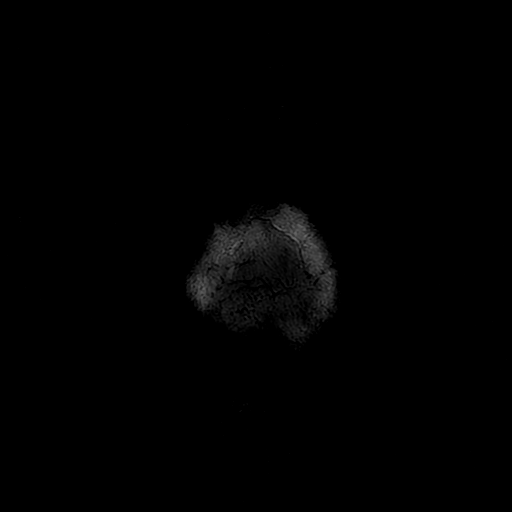

[Series 400: DWI · axial · 3.0mm · 1.09mm/px · z∈[-35,+105]mm · 3 of 48 slices shown (3 of 4)]
[im 1/48]
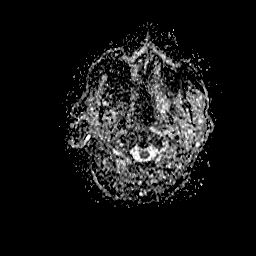
[im 24/48]
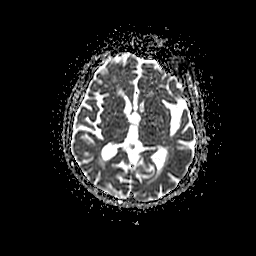
[im 48/48]
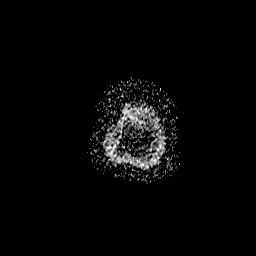

[Series 500: DWI · coronal · 5.0mm · 1.09mm/px · 2 of 35 slices shown (4 of 4)]
[im 1/35]
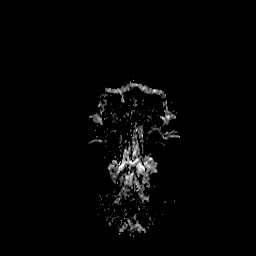
[im 35/35]
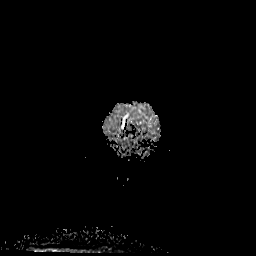

[29 of 48 positions shown; findings below may reference images not displayed]

FINDINGS: MRI HEAD FINDINGS

Brain: There is a small focus of abnormal diffusion restriction
within the left frontal white matter. No other diffusion
abnormality. Early confluent hyperintense T2-weighted signal of the
periventricular and deep white matter. Generalized volume loss. Old
right cerebellar infarcts. No chronic microhemorrhage. Normal
midline structures.

Vascular: Major flow voids are preserved.

Skull and upper cervical spine: Normal calvarium and skull base.
Visualized upper cervical spine and soft tissues are normal.

Sinuses/Orbits:No paranasal sinus fluid levels or advanced mucosal
thickening. No mastoid or middle ear effusion. Normal orbits.

MRA HEAD FINDINGS

POSTERIOR CIRCULATION:

--Vertebral arteries: Normal

--Inferior cerebellar arteries: Normal.

--Basilar artery: Normal.

--Superior cerebellar arteries: Normal.

--Posterior cerebral arteries: Normal.

ANTERIOR CIRCULATION:

--Intracranial internal carotid arteries: Normal.

--Anterior cerebral arteries (ACA): Normal.

--Middle cerebral arteries (MCA): Irregularity of the right MCA is
likely exaggerated by motion. Normal left MCA.

ANATOMIC VARIANTS: None

MRA NECK FINDINGS

No carotid stenosis. The visualized portions of the vertebral
arteries are patent.
IMPRESSION: 1. Small focus of acute ischemia within the left frontal white
matter. No hemorrhage or mass effect.
2. No emergent large vessel occlusion or high-grade stenosis in the
head or neck.

## 2019-12-09 MED ORDER — STROKE: EARLY STAGES OF RECOVERY BOOK
Freq: Once | Status: AC
  Filled 2019-12-09: qty 1

## 2019-12-09 MED ORDER — SODIUM CHLORIDE 0.9 % IV SOLN: INTRAVENOUS | Status: DC

## 2019-12-09 MED ORDER — CARBIDOPA-LEVODOPA ER 50-200 MG PO TBCR
1.0000 | EXTENDED_RELEASE_TABLET | Freq: Every day | ORAL | Status: DC
  Administered 2019-12-09: 1 via ORAL
  Filled 2019-12-09 (×3): qty 1

## 2019-12-09 MED ORDER — ASPIRIN 300 MG RE SUPP: 300.0000 mg | Freq: Every day | RECTAL | Status: DC

## 2019-12-09 MED ORDER — ROSUVASTATIN CALCIUM 5 MG PO TABS
10.0000 mg | ORAL_TABLET | Freq: Every day | ORAL | Status: DC
  Administered 2019-12-10: 10 mg via ORAL
  Filled 2019-12-09: qty 2

## 2019-12-09 MED ORDER — SENNOSIDES-DOCUSATE SODIUM 8.6-50 MG PO TABS: 1.0000 | ORAL_TABLET | Freq: Every evening | ORAL | Status: DC | PRN

## 2019-12-09 MED ORDER — ACETAMINOPHEN 160 MG/5ML PO SOLN: 650.0000 mg | ORAL | Status: DC | PRN

## 2019-12-09 MED ORDER — ENOXAPARIN SODIUM 40 MG/0.4ML ~~LOC~~ SOLN
40.0000 mg | SUBCUTANEOUS | Status: DC
  Administered 2019-12-09: 40 mg via SUBCUTANEOUS
  Filled 2019-12-09: qty 0.4

## 2019-12-09 MED ORDER — ACETAMINOPHEN 650 MG RE SUPP: 650.0000 mg | RECTAL | Status: DC | PRN

## 2019-12-09 MED ORDER — ASPIRIN EC 81 MG PO TBEC
81.0000 mg | DELAYED_RELEASE_TABLET | Freq: Every day | ORAL | Status: DC
  Filled 2019-12-09: qty 1

## 2019-12-09 MED ORDER — ASPIRIN 325 MG PO TABS
325.0000 mg | ORAL_TABLET | Freq: Every day | ORAL | Status: DC
  Administered 2019-12-09 – 2019-12-10 (×2): 325 mg via ORAL
  Filled 2019-12-09 (×3): qty 1

## 2019-12-09 MED ORDER — ACETAMINOPHEN 325 MG PO TABS: 650.0000 mg | ORAL_TABLET | ORAL | Status: DC | PRN

## 2019-12-09 MED ORDER — CARBIDOPA-LEVODOPA 25-100 MG PO TABS
2.0000 | ORAL_TABLET | Freq: Four times a day (QID) | ORAL | Status: DC
  Administered 2019-12-09 – 2019-12-10 (×4): 2 via ORAL
  Filled 2019-12-09 (×6): qty 2

## 2019-12-09 MED ORDER — ROSUVASTATIN CALCIUM 5 MG PO TABS: 5.0000 mg | ORAL_TABLET | Freq: Every day | ORAL | Status: DC

## 2019-12-09 NOTE — H&P (Addendum)
History and Physical   Katherine Novak IRW:[id number] DOB: 1934-08-21 DOA: 12/09/2019  PCP: [name], MD   Patient coming from: Home  Chief Complaint: Altered Speech, Weakness  HPI: Katherine Novak is a 85 y.o. female with medical history significant of Parkinson's disease, Barrett's esophagus, CLL, GERD, AAA, HLD who presents following episodes of garbled speech and right-sided weakness.  History obtained with assistance of the patient's family due to need of interpretation for her [place], [place]. Patient has a history of Parkinson disease these on Sinemet has had some worsening symptoms for the past weeks to months including increased tremor and difficulty with ambulation; but son states they recently saw the neurologist who increased her medication dose.  Patient was last at baseline around 10 PM yesterday.  When patient was seen this morning she was noticed to have some garbled speech and some right-sided weakness requiring increased assistance from family.  Her speech seemed to improve throughout the day but not back to her baseline and then became garbled again.  She also is noted to have an episode of choking while eating which prompted family to call EMS and she was transported to the ED.  No recent reports of fever, cough, chest pain, shortness of breath, abdominal pain, nausea, constipation, diarrhea  ED Course: Vital signs in ED significant for blood pressure in the 761P to 509T systolic.  Lab work showed normal BMP, LFTs with protein of 6, CBC with hemoglobin of 10.8 unclear baseline, respiratory panel pending, urinalysis pending.  CT head showed no acute abnormality.  MR brain and MRA not back at the time of admission however per report this has been read as acute CVA likely embolic origin will await formal report.  Code stroke called in the ED and has been seen by neurology.  Review of Systems: As per HPI otherwise all other systems reviewed and are negative.  Obtained  with assistance from patients son.  Past Medical History:  Diagnosis Date  . AAA (abdominal aortic aneurysm) ([place])   . Arthritis   . Barrett's esophagus   . Blood dyscrasia    leukemia cll  . GERD (gastroesophageal reflux disease)   . History of recurrent UTIs   . Hypercholesterolemia   . Hypertension   . Leukemia ([place])    CLL observation  dr Inda Merlin cone  . Osteoporosis   . Vitamin D deficiency     Past Surgical History:  Procedure Laterality Date  . carpel tunnel-left    . CATARACT EXTRACTION  bilateral  . TOTAL KNEE ARTHROPLASTY Left 06/23/2012   Procedure: TOTAL KNEE ARTHROPLASTY;  Surgeon: Hessie Dibble, MD;  Location: [facility];  Service: Orthopedics;  Laterality: Left;  DEPUY, RNFA  . TOTAL KNEE ARTHROPLASTY Right 08/10/2013   Procedure: TOTAL KNEE ARTHROPLASTY;  Surgeon: Hessie Dibble, MD;  Location: [facility];  Service: Orthopedics;  Laterality: Right;    Social History  reports that she has never smoked. She has never used smokeless tobacco. She reports that she does not drink alcohol and does not use drugs.  Allergies  Allergen Reactions  . Sulfa Antibiotics     Angioedema of lips , throat    Family History  Problem Relation Age of Onset  . Cancer Brother   Reviewed on admission  Prior to Admission medications   Medication Sig Start Date End Date Taking? Authorizing Provider  acetaminophen (TYLENOL) 325 MG tablet Take 650 mg by mouth every 6 (six) hours as needed.    [provider]  alendronate (FOSAMAX) 70 MG tablet Take 70 mg by mouth every 7 (seven) days. Take with a full glass of water on an empty stomach.  On Wednesday    [provider]  carbidopa-levodopa (SINEMET CR) 50-200 MG tablet TAKE 1 TABLET BY MOUTH EVERYDAY AT BEDTIME 05/31/19   Tat, Rebecca S, DO  carbidopa-levodopa (SINEMET IR) 25-100 MG tablet Take 2 tablets by mouth 4 (four) times daily. 2 tablets at 6 AM/2 tablet at 10 AM/2 tablets at 2 PM/2 tablet at 6 PM 12/07/19   Tat,  Wells Guiles S, DO  diclofenac sodium (VOLTAREN) 1 % GEL Apply topically 4 (four) times daily. Patient not taking: Reported on 12/07/2019    [provider]  furosemide (LASIX) 20 MG tablet Take 20 mg by mouth daily as needed.    [provider]  HYDROcodone-acetaminophen (NORCO) 10-325 MG per tablet Take 1-2 tablets by mouth every 6 (six) hours as needed for moderate pain or severe pain. 08/13/13   Loni Dolly, PA-C  metoprolol tartrate (LOPRESSOR) 25 MG tablet Take 25 mg by mouth 2 (two) times daily.    [provider]  mirtazapine (REMERON) 15 MG tablet Take 1 tablet (15 mg total) by mouth at bedtime. 12/07/19   Tat, Eustace Quail, DO  omeprazole (PRILOSEC) 20 MG capsule Take 20 mg by mouth daily.    [provider]  rosuvastatin (CRESTOR) 5 MG tablet Take 5 mg by mouth daily.    [provider]    Physical Exam: Vitals:   12/09/19 1830 12/09/19 1831 12/09/19 1900 12/09/19 1915  BP:  (!) 170/98 (!) 170/83 (!) 151/87  Pulse: 84 84 81 75  Resp: 17 19 18 12   Temp:      TempSrc:      SpO2: 100% 98% 98% 100%  Weight:      Height:       Physical Exam Constitutional:      General: She is not in acute distress.    Appearance: Normal appearance.  HENT:     Head: Normocephalic and atraumatic.     Mouth/Throat:     Mouth: Mucous membranes are moist.     Pharynx: Oropharynx is clear.  Eyes:     Extraocular Movements: Extraocular movements intact.     Pupils: Pupils are equal, round, and reactive to light.  Cardiovascular:     Rate and Rhythm: Normal rate and regular rhythm.     Pulses: Normal pulses.     Heart sounds: Normal heart sounds.  Pulmonary:     Effort: Pulmonary effort is normal. No respiratory distress.     Breath sounds: Normal breath sounds.  Abdominal:     General: Bowel sounds are normal. There is no distension.     Palpations: Abdomen is soft.     Tenderness: There is no abdominal tenderness.  Musculoskeletal:        General: No  swelling or deformity.  Skin:    General: Skin is warm and dry.  Neurological:     Mental Status: Mental status is at baseline.     Comments: Mental Status: Patient is awake, alert Left Gaze preference Cranial Nerves: II: Pupils equal, round, and reactive to light.  III,IV, VI: EOMI, left gaze preference  V: Facial sensation is symmetric tolight touch VII: Facial movement is symmetric.  VIII: hearing is intact to voice X: Uvula elevates symmetrically XI: Shoulder shrug is symmetric. XII: tongue is midline without atrophy or fasciculations.  Motor: good effort thorughout, at  Least 4/5 RUE and RLE, 5/5 LUE and LLE Sensory: Sensation is grossly intact bilateral UEs & LEs Cerebellar: Finger-Nose intact bilalat, slower on right     Labs on Admission: I have personally reviewed following labs and imaging studies  CBC: Recent Labs  Lab 12/09/19 1605  WBC 5.4  NEUTROABS 3.1  HGB 10.8*  HCT 36.7  MCV 86.4  PLT 619    Basic Metabolic Panel: Recent Labs  Lab 12/09/19 1605  NA 143  K 3.8  CL 105  CO2 28  GLUCOSE 103*  BUN 8  CREATININE 0.68  CALCIUM 9.5    GFR: Estimated Creatinine Clearance: 41.2 mL/min (by C-G formula based on SCr of 0.68 mg/dL).  Liver Function Tests: Recent Labs  Lab 12/09/19 1605  AST 13*  ALT <5  ALKPHOS 60  BILITOT 0.5  PROT 6.0*  ALBUMIN 3.8    Urine analysis:    Component Value Date/Time   COLORURINE YELLOW 07/28/2013 1013   APPEARANCEUR CLEAR 07/28/2013 1013   LABSPEC 1.021 07/28/2013 1013   PHURINE 6.0 07/28/2013 1013   GLUCOSEU NEGATIVE 07/28/2013 1013   HGBUR NEGATIVE 07/28/2013 1013   [place] 07/28/2013 1013   KETONESUR NEGATIVE 07/28/2013 1013   PROTEINUR NEGATIVE 07/28/2013 1013   UROBILINOGEN 0.2 07/28/2013 1013   NITRITE NEGATIVE 07/28/2013 1013   LEUKOCYTESUR SMALL (A) 07/28/2013 1013    Radiological Exams on Admission: CT HEAD CODE STROKE WO CONTRAST`  Result Date: 12/09/2019 CLINICAL DATA:   Code stroke.  Acute neuro deficit. EXAM: CT HEAD WITHOUT CONTRAST TECHNIQUE: Contiguous axial images were obtained from the base of the skull through the vertex without intravenous contrast. COMPARISON:  CT head 02/28/2009 FINDINGS: Brain: Generalized atrophy with progression. Moderate to extensive white matter disease bilaterally with progression. Small chronic infarct right occipital lobe has developed since the prior study. Negative for hydrocephalus. Negative for acute infarct, hemorrhage, mass. Vascular: Negative for hyperdense vessel Skull: Negative Sinuses/Orbits: Mild mucosal edema paranasal sinuses. Bilateral cataract extraction Other: None ASPECTS ([place] Stroke Program Early CT Score) - Ganglionic level infarction (caudate, lentiform nuclei, internal capsule, insula, M1-M3 cortex): 7 - Supraganglionic infarction (M4-M6 cortex): 3 Total score (0-10 with 10 being normal): 10 IMPRESSION: 1. No acute intracranial abnormality 2. ASPECTS is 10 3. Atrophy and chronic ischemic changes with progression since 2011 4. Code stroke imaging results were communicated on 12/09/2019 at 4:08 pm to provider Rory Percy via [place] Electronically Signed   By: Franchot Gallo M.D.   On: 12/09/2019 16:09    EKG: Independently reviewed.  Sinus rhythm, 81 bpm, baseline wander in V1.  Assessment/Plan Principal Problem:   Acute CVA (cerebrovascular accident) [facility]) Active Problems:   Hypercholesterolemia   Parkinson's disease ([place])  Acute CVA > 1 day of garbled speech and right-sided weakness, complicated by history of Parkinson's disease > CT head without acute abnormality, but per report MR did show acute CVA awaiting formal read > MRI results pending (initial read from neurology is likely embolic CVA) > Patient passed bedside swallow and is requesting to eat, son states he will be staying here to watch her - Neurology consulted in ED - Allow for permissive HTN, systolic < 509 and diastolic < 326 - ASA 712 mg / 81 mg  daily  - Increase home rosuvastatin dose - Echocardiogram  - A1C  - Lipid panel  - Tele monitoring  - SLP eval - PT/OT  Parkinson's Disease > Recently had dose increased T2 some increased symptoms of tremor and difficulty with ambulation -  Continue home Sinemet regimen  HLD - Increasing Rosuvastatin to 10mg  Daily  DVT prophylaxis: Lovenox Code Status:   Full, for now (son CT scan to discuss this with his mother).  He has requested assistance with creating a living will for.  Family Communication:  Discussed with patient's son, Nimish, at bedside   Disposition Plan:   Patient is from:  Home  Anticipated DC to:  Home  Anticipated DC date:  12/3 or 12/4  Anticipated DC barriers: None  Consults called:  Neurology, Dr. Rory Percy, consulted in ED Admission status:  Observation, telemetry   Severity of Illness: The appropriate patient status for this patient is OBSERVATION. Observation status is judged to be reasonable and necessary in order to provide the required intensity of service to ensure the patient's safety. The patient's presenting symptoms, physical exam findings, and initial radiographic and laboratory data in the context of their medical condition is felt to place them at decreased risk for further clinical deterioration. Furthermore, it is anticipated that the patient will be medically stable for discharge from the hospital within 2 midnights of admission. The following factors support the patient status of observation.   " The patient's presenting symptoms include garbled speech, right-sided weakness. " The physical exam findings include right-sided weakness. " The initial radiographic and laboratory data are consistent with CVA per reports, awaiting final reads.   Marcelyn Bruins MD [facility]  How to contact the [facility] Attending or Consulting provider [place] or covering provider during after hours [place], for this patient?   1. Check the care team in [facility] and  look for a) attending/consulting [facility] provider listed and b) the [facility] team listed 2. Log into [email or web address] and use [place] universal password to access. If you do not have the password, please contact the hospital operator. 3. Locate the [facility] provider you are looking for under [facility] and page to a number that you can be directly reached. 4. If you still have difficulty reaching the provider, please page the [facility] (Director on Call) for the Hospitalists listed on amion for assistance.  12/09/2019, 7:55 PM

## 2019-12-09 NOTE — Telephone Encounter (Addendum)
Spoke with Dr Tat and she recommends the patient go to the Emergency Department where she can be evaluated where theres an interpreter to help evaluate the patient in their language.  Spoke with patients son and informed him of Dr Doristine Devoid recommendations. He states his mom will not want to go the Emergency Department. He states he will try to contact PCP first.   Advised him again that Dr Tat recommends the patient go to the Emergency Department to be evaluated where theres an interpreter that can speak their language.  He voiced understanding.

## 2019-12-09 NOTE — ED Notes (Signed)
Handoff given to [employer]

## 2019-12-09 NOTE — ED Notes (Signed)
Taken to MRI 

## 2019-12-09 NOTE — Consult Note (Addendum)
Neurology Consultation  Reason for Consult: Strokelike symptoms Referring Physician: Dr. Roderic Palau  CC: Garbled speech  History is obtained from: Son, chart  HPI: Katherine Novak is a 85 y.o. female past medical history of Barrett's esophagus, Parkinson's disease on Sinemet with worsening symptoms over the past few weeks to months, RBD, depression, last known well at 10 PM when she went to bed yesterday and woke up this morning with garbled speech and some right-sided weakness. The speech somewhat improved without coming back to baseline but then became garbled again. She also had choking while trying to eat and this was concerning to the family for which EMS was called. She was brought in by EMS for evaluation of the symptoms. Since she was outside the window for IV TPA, and had no weakness on examination by EMS, there was no concern for LVO from a field scale standpoint but the case was discussed with me by one of the nurses from the ER and I was told that she was unable to look all the way to her right indicating gaze preference, and I requested that a code stroke LVO should be activated. Upon further history taking, the patient is at least a modified Rankin of 3 if not 4, which precludes her from intervention but in any case we worked her up as an acute code stroke.  Son reports that she has been having worsening of her Parkinson's with increasing tremor on the right side and difficulty with ambulation. She requires 24/7 assistance with ADLs. She is also been having difficulty with eating not so much because of dysphagia but because of bad taste in her mouth. The son is reporting the speech getting more slurred over time as well. She sees Dr. Kenney Houseman neurology   LKW: 10 PM 12/08/2019 tpa given?: no, outside the window Premorbid modified Rankin scale (mRS): 3  ROS: Unable to obtain due to altered mental status  Past Medical History:  Diagnosis Date  . AAA (abdominal aortic aneurysm) ([place])    . Arthritis   . Barrett's esophagus   . Blood dyscrasia    leukemia cll  . GERD (gastroesophageal reflux disease)   . History of recurrent UTIs   . Hypercholesterolemia   . Hypertension   . Leukemia ([place])    CLL observation  dr Inda Merlin cone  . Osteoporosis   . Vitamin D deficiency     Family History  Problem Relation Age of Onset  . Cancer Brother     Social History:   reports that she has never smoked. She has never used smokeless tobacco. She reports that she does not drink alcohol and does not use drugs.  Medications No current facility-administered medications for this encounter.  Current Outpatient Medications:  .  acetaminophen (TYLENOL) 325 MG tablet, Take 650 mg by mouth every 6 (six) hours as needed., Disp: , Rfl:  .  alendronate (FOSAMAX) 70 MG tablet, Take 70 mg by mouth every 7 (seven) days. Take with a full glass of water on an empty stomach.  On Wednesday, Disp: , Rfl:  .  carbidopa-levodopa (SINEMET CR) 50-200 MG tablet, TAKE 1 TABLET BY MOUTH EVERYDAY AT BEDTIME, Disp: 90 tablet, Rfl: 1 .  carbidopa-levodopa (SINEMET IR) 25-100 MG tablet, Take 2 tablets by mouth 4 (four) times daily. 2 tablets at 6 AM/2 tablet at 10 AM/2 tablets at 2 PM/2 tablet at 6 PM, Disp: 720 tablet, Rfl: 1 .  diclofenac sodium (VOLTAREN) 1 % GEL, Apply topically 4 (four) times  daily. (Patient not taking: Reported on 12/07/2019), Disp: , Rfl:  .  furosemide (LASIX) 20 MG tablet, Take 20 mg by mouth daily as needed., Disp: , Rfl:  .  HYDROcodone-acetaminophen (NORCO) 10-325 MG per tablet, Take 1-2 tablets by mouth every 6 (six) hours as needed for moderate pain or severe pain., Disp: 50 tablet, Rfl: 0 .  metoprolol tartrate (LOPRESSOR) 25 MG tablet, Take 25 mg by mouth 2 (two) times daily., Disp: , Rfl:  .  mirtazapine (REMERON) 15 MG tablet, Take 1 tablet (15 mg total) by mouth at bedtime., Disp: 90 tablet, Rfl: 1 .  omeprazole (PRILOSEC) 20 MG capsule, Take 20 mg by mouth daily., Disp: ,  Rfl:  .  rosuvastatin (CRESTOR) 5 MG tablet, Take 5 mg by mouth daily., Disp: , Rfl:   Exam: Current vital signs: BP (!) 178/88   Pulse 82   Temp 98.5 F (36.9 C) (Oral)   Resp 11   Ht 4\' 11"  (1.499 m)   Wt 57.6 kg   SpO2 99%   BMI 25.65 kg/m  Vital signs in last 24 hours: Temp:  [98.5 F (36.9 C)] 98.5 F (36.9 C) (12/02 1600) Pulse Rate:  [81-83] 82 (12/02 1654) Resp:  [11-18] 11 (12/02 1654) BP: (158-178)/(81-99) 178/88 (12/02 1654) SpO2:  [98 %-100 %] 99 % (12/02 1654) Weight:  [57.6 kg] 57.6 kg (12/02 1609) General: Awake alert in no distress HEENT: Normocephalic atraumatic Lung: Clear Cardiovascular: Regular rhythm Abdomen soft nondistended nontender Extremities warm well perfused Neurological exam She is awake, alert, oriented to self. She was not oriented to her age or month. Her speech is mildly dysarthric Mild aphasia-she was able to start sentences normally but towards the end her words became garbled and incomprehensible-almost word salad like. There was a language barrier-she speaks [place], and some Hindi. I am fluent in [place] and she was able to participate for most part with me in [place]. Her son later came on and was able to translate [place]. Cranial nerves: Pupils equal round reactive light, extraocular movement exam revealed inability to cross midline to look to the right, she could look to the left without a problem, no visual field cut, face with mild right nasolabial fold flattening, facial sensation intact. Motor exam: Increased tone with cogwheeling in the right upper extremity with 4+5 strength without vertical drift. Right lower extremity 4+/5 without vertical drift. Left upper and lower extremity 5/5 without vertical drift. Sensation intact without extinction Coordination: No dysmetria, resting tremor on the right very noticeable. Not much of her resting tremor on the left. NIHSS 1a Level of Conscious.: 0 1b LOC Questions: 2 1c LOC Commands:  0 2 Best Gaze: 1 3 Visual:00 4 Facial Palsy: 1 5a Motor Arm - left: 0 5b Motor Arm - Right: 0 6a Motor Leg - Left: 0 6b Motor Leg - Right: 0 7 Limb Ataxia: 0 8 Sensory: 0 9 Best Language: 1 10 Dysarthria: 1 11 Extinct. and Inatten.: 0 TOTAL: 6  Labs I have reviewed labs in epic and the results pertinent to this consultation are:  CBC    Component Value Date/Time   WBC 5.4 12/09/2019 1605   RBC 4.25 12/09/2019 1605   HGB 10.8 (L) 12/09/2019 1605   HGB 10.8 (L) 02/01/2013 0910   HCT 36.7 12/09/2019 1605   HCT 34.6 (L) 02/01/2013 0910   PLT 168 12/09/2019 1605   PLT 177 02/01/2013 0910   MCV 86.4 12/09/2019 1605   MCV 80.2 02/01/2013 0910  MCH 25.4 (L) 12/09/2019 1605   MCHC 29.4 (L) 12/09/2019 1605   RDW 14.8 12/09/2019 1605   RDW 17.1 (H) 02/01/2013 0910   LYMPHSABS 1.9 12/09/2019 1605   LYMPHSABS 6.6 (H) 02/01/2013 0910   MONOABS 0.4 12/09/2019 1605   MONOABS 0.4 02/01/2013 0910   EOSABS 0.0 12/09/2019 1605   EOSABS 0.3 02/01/2013 0910   BASOSABS 0.0 12/09/2019 1605   BASOSABS 0.1 02/01/2013 0910    CMP     Component Value Date/Time   NA 140 08/11/2013 0620   NA 143 02/01/2013 0911   K 4.4 08/11/2013 0620   K 4.0 02/01/2013 0911   CL 104 08/11/2013 0620   CO2 23 08/11/2013 0620   CO2 24 02/01/2013 0911   GLUCOSE 150 (H) 08/11/2013 0620   GLUCOSE 129 02/01/2013 0911   BUN 15 08/11/2013 0620   BUN 11.5 02/01/2013 0911   CREATININE 0.54 08/11/2013 0620   CREATININE 0.7 02/01/2013 0911   CALCIUM 8.3 (L) 08/11/2013 0620   CALCIUM 9.4 02/01/2013 0911   PROT 6.6 02/01/2013 0911   ALBUMIN 3.8 02/01/2013 0911   AST 19 02/01/2013 0911   ALT 14 02/01/2013 0911   ALKPHOS 80 02/01/2013 0911   BILITOT 0.28 02/01/2013 0911   GFRNONAA 89 (L) 08/11/2013 0620   GFRAA >90 08/11/2013 0620   CMP pending.  Imaging I have reviewed the images obtained:  CT-scan of the brain-no acute changes. Atrophy and ischemic changes progress since 2011.  MRI examination of  the brain--left hemispheric strokes. Official read pending. MRA head with no emergent LVO. MRA neck formal read pending  Assessment:  85 year old with above past medical history presenting for evaluation of sudden onset of garbled speech, difficulty swallowing and right-sided weakness. Some symptoms such as the garbled speech are waxing and waning but the swallowing trouble and the right-sided weakness along with garbled speech continued to persist leading her to come to the hospital. Full code stroke not activated due to lack of any weakness in her being outside the window for IV TPA. Closer examination revealed a left gaze preference, warranting an LVO code stroke activation. Not a candidate for IV TPA due to being outside the window Not a candidate for EVT due to modified Rankin being more than 2. Vessel imaging not performed by CTA due to unclear renal function at this time. Instead, stat MRI performed.  It showed embolic-looking infarcts in the left hemisphere. Needs admission for stroke work-up   Impression: Acute ischemic stroke-likely cardioembolic Parkinson's disease  Recommendations: Admit to hospitalist Frequent neurochecks Aspirin 325 now and daily Statin 2D echo A1c Lipid panel PT OT Speech therapy Swallow evaluation N.p.o. until cleared by bedside swallow evaluation Permissive hypertension-allow for blood pressures to be as high as up to systolic 169 and treat only on a as needed basis if they are above that.   From Parkinson's standpoint-continue home doses of Sinemet.  Patient speaks only [place]-one of the family members should be permitted to stay with her to avoid any confusion due to miscommunication or misinterpretation at night. Although patient does not have any history of dementia, she has Parkinson's and I suspect that there might be some sundowning given the history of RBD. I have discussed this with the ED PA-C.   Please page stroke NP/PA/MD  (listed on AMION)  from 8am-4 pm as this patient will be followed by the stroke team at this point.  Discussed my plan in detail with the ED provider. Discussed my plan in detail  with the son at bedside and will call the other son, who is a Copywriter, advertising in [place] shortly.  -- Amie Portland, MD [facility] Pager: [phone] If 7pm to 7am, please call on call as listed on AMION.

## 2019-12-09 NOTE — ED Notes (Signed)
Labs collected and sent.

## 2019-12-09 NOTE — ED Provider Notes (Signed)
[facility] EMERGENCY DEPARTMENT Provider Note   CSN: [id number] Arrival date & time: 12/09/19  1513  An emergency department physician performed an initial assessment on this suspected stroke patient at 1520.  History Chief Complaint  Patient presents with  . Altered Mental Status    Katherine Novak is a 85 y.o. female presenting for strokelike symptoms.  Level 5 caveat as patient does not speak [place] and interpreter was not immediately available.  Per EMS, patient last seen normal around 10 PM last night. At 8:00, family noted that patient was not able to swallow normally.  She was also not walking as well as normal, and had some abnormality of her speech.  Felt she was slightly confused.  EMS thought they noted right-sided arm weakness, the patient with a history of Parkinson, mostly manifested on the right side.   Additional history taken from chart review.  Patient with history of Parkinson's, followed with with our neurology.  Additional history of AAA, GERD, hypertension, CLL under observation by Dr. Earlie Server.   HPI     Past Medical History:  Diagnosis Date  . AAA (abdominal aortic aneurysm) ([place])   . Arthritis   . Barrett's esophagus   . Blood dyscrasia    leukemia cll  . GERD (gastroesophageal reflux disease)   . History of recurrent UTIs   . Hypercholesterolemia   . Hypertension   . Leukemia ([place])    CLL observation  dr Inda Merlin cone  . Osteoporosis   . Vitamin D deficiency     Patient Active Problem List   Diagnosis Date Noted  . Acute CVA (cerebrovascular accident) ([place]) 12/09/2019  . Parkinson's disease ([place]) 01/22/2017  . Postoperative anemia due to acute blood loss 08/12/2013  . Right knee DJD 08/10/2013  . CLL (chronic lymphocytic leukemia) ([facility]) 07/30/2012  . Left knee DJD 06/23/2012    Class: Chronic  . Hypercholesterolemia     Past Surgical History:  Procedure Laterality Date  . carpel tunnel-left    . CATARACT  EXTRACTION  bilateral  . TOTAL KNEE ARTHROPLASTY Left 06/23/2012   Procedure: TOTAL KNEE ARTHROPLASTY;  Surgeon: Hessie Dibble, MD;  Location: [facility];  Service: Orthopedics;  Laterality: Left;  DEPUY, RNFA  . TOTAL KNEE ARTHROPLASTY Right 08/10/2013   Procedure: TOTAL KNEE ARTHROPLASTY;  Surgeon: Hessie Dibble, MD;  Location: [facility];  Service: Orthopedics;  Laterality: Right;     OB History   No obstetric history on file.     Family History  Problem Relation Age of Onset  . Cancer Brother     Social History   Tobacco Use  . Smoking status: Never Smoker  . Smokeless tobacco: Never Used  Substance Use Topics  . Alcohol use: No  . Drug use: No    Home Medications Prior to Admission medications   Medication Sig Start Date End Date Taking? Authorizing Provider  acetaminophen (TYLENOL) 325 MG tablet Take 650 mg by mouth every 6 (six) hours as needed.    [provider]  alendronate (FOSAMAX) 70 MG tablet Take 70 mg by mouth every 7 (seven) days. Take with a full glass of water on an empty stomach.  On Wednesday    [provider]  carbidopa-levodopa (SINEMET CR) 50-200 MG tablet TAKE 1 TABLET BY MOUTH EVERYDAY AT BEDTIME 05/31/19   Tat, Rebecca S, DO  carbidopa-levodopa (SINEMET IR) 25-100 MG tablet Take 2 tablets by mouth 4 (four) times daily. 2 tablets at 6 AM/2 tablet at  10 AM/2 tablets at 2 PM/2 tablet at 6 PM 12/07/19   Tat, Wells Guiles S, DO  diclofenac sodium (VOLTAREN) 1 % GEL Apply topically 4 (four) times daily. Patient not taking: Reported on 12/07/2019    [provider]  furosemide (LASIX) 20 MG tablet Take 20 mg by mouth daily as needed.    [provider]  HYDROcodone-acetaminophen (NORCO) 10-325 MG per tablet Take 1-2 tablets by mouth every 6 (six) hours as needed for moderate pain or severe pain. 08/13/13   Loni Dolly, PA-C  metoprolol tartrate (LOPRESSOR) 25 MG tablet Take 25 mg by mouth 2 (two) times daily.    [provider]  mirtazapine (REMERON) 15 MG tablet Take 1 tablet (15 mg total) by mouth at bedtime. 12/07/19   Tat, Eustace Quail, DO  omeprazole (PRILOSEC) 20 MG capsule Take 20 mg by mouth daily.    [provider]  rosuvastatin (CRESTOR) 5 MG tablet Take 5 mg by mouth daily.    [provider]    Allergies    Sulfa antibiotics  Review of Systems   Review of Systems  Unable to perform ROS: Other  Neurological: Positive for speech difficulty and weakness.   Language barrier  Physical Exam Updated Vital Signs BP (!) 151/87   Pulse 75   Temp 98.5 F (36.9 C) (Oral)   Resp 12   Ht 4\' 11"  (1.499 m)   Wt 57.6 kg   SpO2 100%   BMI 25.65 kg/m   Physical Exam Vitals and nursing note reviewed.  Constitutional:      General: She is not in acute distress.    Appearance: She is well-developed.     Comments: Appears nontoxic  HENT:     Head: Normocephalic and atraumatic.  Eyes:     Conjunctiva/sclera: Conjunctivae normal.     Comments: Gaze does not cross midline when looking to the R  Cardiovascular:     Rate and Rhythm: Normal rate and regular rhythm.     Pulses: Normal pulses.  Pulmonary:     Effort: Pulmonary effort is normal. No respiratory distress.     Breath sounds: Normal breath sounds. No wheezing.  Abdominal:     General: There is no distension.     Palpations: Abdomen is soft. There is no mass.     Tenderness: There is no abdominal tenderness. There is no guarding or rebound.  Musculoskeletal:        General: Normal range of motion.     Cervical back: Normal range of motion and neck supple.  Skin:    General: Skin is warm and dry.     Capillary Refill: Capillary refill takes less than 2 seconds.  Neurological:     Mental Status: She is alert and oriented to person, place, and time.     Comments: ?  Slightly decreased strength on the right side, although patient also with Parkinson's tremors making exam difficult.  No obvious pronator drift.  No weakness of  the lower extremities.  Gaze does not cross midline.  Exam otherwise difficult to perform as there is no interpreter and patient does not speak [place].     ED Results / Procedures / Treatments   Labs (all labs ordered are listed, but only abnormal results are displayed) Labs Reviewed  CBC WITH DIFFERENTIAL/PLATELET - Abnormal; Notable for the following components:      Result Value   Hemoglobin 10.8 (*)    MCH 25.4 (*)    MCHC 29.4 (*)  All other components within normal limits  COMPREHENSIVE METABOLIC PANEL - Abnormal; Notable for the following components:   Glucose, Bld 103 (*)    Total Protein 6.0 (*)    AST 13 (*)    All other components within normal limits  RESP PANEL BY RT-PCR (FLU A&B, COVID) ARPGX2  URINALYSIS, ROUTINE W REFLEX MICROSCOPIC  HEMOGLOBIN A1C  LIPID PANEL    EKG None  Radiology CT HEAD CODE STROKE WO CONTRAST`  Result Date: 12/09/2019 CLINICAL DATA:  Code stroke.  Acute neuro deficit. EXAM: CT HEAD WITHOUT CONTRAST TECHNIQUE: Contiguous axial images were obtained from the base of the skull through the vertex without intravenous contrast. COMPARISON:  CT head 02/28/2009 FINDINGS: Brain: Generalized atrophy with progression. Moderate to extensive white matter disease bilaterally with progression. Small chronic infarct right occipital lobe has developed since the prior study. Negative for hydrocephalus. Negative for acute infarct, hemorrhage, mass. Vascular: Negative for hyperdense vessel Skull: Negative Sinuses/Orbits: Mild mucosal edema paranasal sinuses. Bilateral cataract extraction Other: None ASPECTS ([place] Stroke Program Early CT Score) - Ganglionic level infarction (caudate, lentiform nuclei, internal capsule, insula, M1-M3 cortex): 7 - Supraganglionic infarction (M4-M6 cortex): 3 Total score (0-10 with 10 being normal): 10 IMPRESSION: 1. No acute intracranial abnormality 2. ASPECTS is 10 3. Atrophy and chronic ischemic changes with progression since  2011 4. Code stroke imaging results were communicated on 12/09/2019 at 4:08 pm to provider Rory Percy via [place] Electronically Signed   By: Franchot Gallo M.D.   On: 12/09/2019 16:09    Procedures Procedures (including critical care time)  Medications Ordered in ED Medications  carbidopa-levodopa (SINEMET IR) 25-100 MG per tablet immediate release 2 tablet (has no administration in time range)  carbidopa-levodopa (SINEMET CR) 50-200 MG per tablet controlled release 1 tablet (has no administration in time range)  rosuvastatin (CRESTOR) tablet 5 mg (has no administration in time range)   stroke: mapping our early stages of recovery book (has no administration in time range)  0.9 %  sodium chloride infusion ( Intravenous New Bag/Given 12/09/19 1950)  acetaminophen (TYLENOL) tablet 650 mg (has no administration in time range)    Or  acetaminophen (TYLENOL) 160 MG/5ML solution 650 mg (has no administration in time range)    Or  acetaminophen (TYLENOL) suppository 650 mg (has no administration in time range)  senna-docusate (Senokot-S) tablet 1 tablet (has no administration in time range)  enoxaparin (LOVENOX) injection 40 mg (has no administration in time range)  aspirin tablet 325 mg (has no administration in time range)  aspirin EC tablet 81 mg (has no administration in time range)    ED Course  I have reviewed the triage vital signs and the nursing notes.  Pertinent labs & imaging results that were available during my care of the patient were reviewed by me and considered in my medical decision making (see chart for details).    MDM Rules/Calculators/A&P                          Patient presenting for strokelike symptoms.  On exam, patient appears nontoxic.  She objectively has gaze deficit, and ? RUE weakness. Concern for stroke. As pt has gaze involvement, lvo stoke called. Dr. Rory Percy evaluated the pt. She will need ct and mri.   Ct negative, mri pending.   Labs interpreted by me,  overall reassuring. No obvious metabolic cause for sxs.   MRI c/w with acute cva.  Neuro is aware, will place recs.  Requests hospitalist admission.  Discussed with Dr. Trilby Drummer from [facility], patient to be admitted.  Final Clinical Impression(s) / ED Diagnoses Final diagnoses:  Cerebrovascular accident (CVA), unspecified mechanism [facility])    Rx / [place] Orders ED Discharge Orders    None       Franchot Heidelberg, PA-C 12/09/19 Loran Senters, MD 12/12/19 [phone]

## 2019-12-09 NOTE — Code Documentation (Signed)
Pt arrived by [facility] EMS with complaints of some "garbled" speech and trouble swallowing that started this morning at 0800. Family called EMS and they transported patient to the ED.   This RN made aware of potential stroke and came to evaluate the patient. The patient only speaks [place] and was unable to fully communicate. Through pantomime, this patient was noted to have some right arm weakness and unable to cross midline to her right with eyes. Dr. Rory Percy notified and Code Stroke called.   Dr. Rory Percy met patient in the hallway and able to speak Hindi. Through his exam, he noted that patient was slightly aphasic and dysarthric along with left gaze. The right arm "weakness" did not present with drift, but was noted to be baseline for patient's parkinsons with tremor.   Patient is outside window for tPA and is not a candidate for IR due to mRS of 4 with history of Parkinsons. Care Plan: Q2 mNIHSS/VS. Handoff given to RN Renee.

## 2019-12-09 NOTE — ED Notes (Signed)
Patient was brought in EMS d/t AMS that started around 8 am today. Per EMS patient LKW was 2200 12/1.  Per EMS family said they were trying to feed her around 8 am this morning and patient was having difficulty, patient was also having difficulty with speech.   Patient obeys basic commands. R sided weakness noted, no R sided gaze. Patient has hx of parkinsons.   No behavior for pain noted.   Taken to CT  18g in L [facility]

## 2019-12-09 NOTE — ED Notes (Addendum)
Returned from MRI 

## 2019-12-09 NOTE — Telephone Encounter (Signed)
Patient's son called in stating the patient is not talking this morning. She seems up and awake and will nod her head, but cannot speak.

## 2019-12-10 ENCOUNTER — Other Ambulatory Visit: Payer: Self-pay | Admitting: Physician Assistant

## 2019-12-10 ENCOUNTER — Observation Stay (HOSPITAL_BASED_OUTPATIENT_CLINIC_OR_DEPARTMENT_OTHER): Payer: Medicare Other

## 2019-12-10 DIAGNOSIS — I639 Cerebral infarction, unspecified: Secondary | ICD-10-CM

## 2019-12-10 DIAGNOSIS — I6389 Other cerebral infarction: Secondary | ICD-10-CM | POA: Diagnosis not present

## 2019-12-10 DIAGNOSIS — E7800 Pure hypercholesterolemia, unspecified: Secondary | ICD-10-CM

## 2019-12-10 DIAGNOSIS — G20 Parkinson's disease: Secondary | ICD-10-CM | POA: Diagnosis not present

## 2019-12-10 DIAGNOSIS — I340 Nonrheumatic mitral (valve) insufficiency: Secondary | ICD-10-CM

## 2019-12-10 DIAGNOSIS — R002 Palpitations: Secondary | ICD-10-CM

## 2019-12-10 LAB — URINALYSIS, ROUTINE W REFLEX MICROSCOPIC
Bacteria, UA: NONE SEEN
Bilirubin Urine: NEGATIVE
Glucose, UA: NEGATIVE mg/dL
Hgb urine dipstick: NEGATIVE
Ketones, ur: 5 mg/dL — AB
Nitrite: NEGATIVE
Protein, ur: NEGATIVE mg/dL
Specific Gravity, Urine: 1.012 (ref 1.005–1.030)
pH: 7 (ref 5.0–8.0)

## 2019-12-10 LAB — LIPID PANEL
Cholesterol: 196 mg/dL (ref 0–200)
HDL: 60 mg/dL (ref >40)
LDL Cholesterol: 112 mg/dL — ABNORMAL HIGH (ref 0–99)
Total CHOL/HDL Ratio: 3.3 RATIO
Triglycerides: 121 mg/dL (ref <150)
VLDL: 24 mg/dL (ref 0–40)

## 2019-12-10 LAB — ECHOCARDIOGRAM COMPLETE
Area-P 1/2: 3.99 cm2
Height: 59 in
P 1/2 time: 464 msec
S' Lateral: 2.5 cm
Weight: 2031.76 oz

## 2019-12-10 LAB — HEMOGLOBIN A1C
Hgb A1c MFr Bld: 6.3 % — ABNORMAL HIGH (ref 4.8–5.6)
Mean Plasma Glucose: 134.11 mg/dL

## 2019-12-10 MED ORDER — CLOPIDOGREL BISULFATE 75 MG PO TABS
75.0000 mg | ORAL_TABLET | Freq: Every day | ORAL | Status: DC
  Administered 2019-12-10: 75 mg via ORAL
  Filled 2019-12-10: qty 1

## 2019-12-10 MED ORDER — ASPIRIN 81 MG PO TBEC: 81.0000 mg | DELAYED_RELEASE_TABLET | Freq: Every day | ORAL | 11 refills | Status: AC

## 2019-12-10 MED ORDER — ROSUVASTATIN CALCIUM 10 MG PO TABS
10.0000 mg | ORAL_TABLET | Freq: Every day | ORAL | 3 refills | Status: DC
Start: 2019-12-11 — End: 2023-11-06

## 2019-12-10 MED ORDER — ASPIRIN EC 81 MG PO TBEC: 81.0000 mg | DELAYED_RELEASE_TABLET | Freq: Every day | ORAL | Status: DC

## 2019-12-10 MED ORDER — CLOPIDOGREL BISULFATE 75 MG PO TABS
75.0000 mg | ORAL_TABLET | Freq: Every day | ORAL | 3 refills | Status: DC
Start: 2019-12-10 — End: 2020-06-20

## 2019-12-10 NOTE — Discharge Instructions (Signed)
Take aspirin and Plavix daily for 21 days, then continue on Plavix alone (okay to stop aspirin at that time).  Follow-up with your neurologist

## 2019-12-10 NOTE — Progress Notes (Signed)
Responded to request to provide patient with Advance Directives.  Visited with patient. Pt.  does'nt speak [place].  Son at bedside. Discussed AD with them both. Provided emotional and spiritual support. Will follow as needed

## 2019-12-10 NOTE — Progress Notes (Addendum)
CSW spoke with Baker Janus of [place] to inform her of the family's request for assistance completing a living will.  Madilyn Fireman, MSW, LCSW-A Transitions of Care  Clinical Social Worker I [facility] Emergency Departments  Medical ICU [phone]

## 2019-12-10 NOTE — ED Notes (Addendum)
Attempted report 

## 2019-12-10 NOTE — ED Notes (Signed)
Breakfast at bedside.

## 2019-12-10 NOTE — ED Notes (Signed)
Breakfast Ordered 

## 2019-12-10 NOTE — Progress Notes (Signed)
STROKE TEAM PROGRESS NOTE   INTERVAL HISTORY Son and RN are at the bedside. Pt son, pt speech pretty much back to her baseline. No weakness now. MRI showed left insular and left frontal small infarcts. MRA head and  Neck neg. Concerning for occult Afib, will need cardiac event monitoring as outpt.  Vitals:   12/10/19 1045 12/10/19 1100 12/10/19 1115 12/10/19 1301  BP:  121/68  132/75  Pulse: 75 72 72 71  Resp: 16 13 12 16   Temp:    97.6 F (36.4 C)  TempSrc:    Oral  SpO2: 99% 99% 98% 97%  Weight:      Height:       CBC:  Recent Labs  Lab 12/09/19 1605  WBC 5.4  NEUTROABS 3.1  HGB 10.8*  HCT 36.7  MCV 86.4  PLT 798   Basic Metabolic Panel:  Recent Labs  Lab 12/09/19 1605  NA 143  K 3.8  CL 105  CO2 28  GLUCOSE 103*  BUN 8  CREATININE 0.68  CALCIUM 9.5   Lipid Panel:  Recent Labs  Lab 12/10/19 0326  CHOL 196  TRIG 121  HDL 60  CHOLHDL 3.3  VLDL 24  LDLCALC 112*   HgbA1c:  Recent Labs  Lab 12/10/19 0326  HGBA1C 6.3*   Urine Drug Screen: No results for input(s): LABOPIA, COCAINSCRNUR, LABBENZ, AMPHETMU, THCU, LABBARB in the last 168 hours.  Alcohol Level No results for input(s): ETH in the last 168 hours.  IMAGING past 24 hours MR ANGIO HEAD WO CONTRAST  Result Date: 12/09/2019 CLINICAL DATA:  Stroke follow-up EXAM: MRI HEAD WITHOUT CONTRAST MRA HEAD WITHOUT CONTRAST MRA NECK WITHOUT CONTRAST TECHNIQUE: Multiplanar, multiecho pulse sequences of the brain and surrounding structures were obtained without intravenous contrast. Angiographic images of the Circle of Willis were obtained using MRA technique without intravenous contrast. Angiographic images of the neck were obtained using MRA technique without intravenous contrast. Carotid stenosis measurements (when applicable) are obtained utilizing NASCET criteria, using the distal internal carotid diameter as the denominator. COMPARISON:  None. FINDINGS: MRI HEAD FINDINGS Brain: There is a small focus of  abnormal diffusion restriction within the left frontal white matter. No other diffusion abnormality. Early confluent hyperintense T2-weighted signal of the periventricular and deep white matter. Generalized volume loss. Old right cerebellar infarcts. No chronic microhemorrhage. Normal midline structures. Vascular: Major flow voids are preserved. Skull and upper cervical spine: Normal calvarium and skull base. Visualized upper cervical spine and soft tissues are normal. Sinuses/Orbits:No paranasal sinus fluid levels or advanced mucosal thickening. No mastoid or middle ear effusion. Normal orbits. MRA HEAD FINDINGS POSTERIOR CIRCULATION: --Vertebral arteries: Normal --Inferior cerebellar arteries: Normal. --Basilar artery: Normal. --Superior cerebellar arteries: Normal. --Posterior cerebral arteries: Normal. ANTERIOR CIRCULATION: --Intracranial internal carotid arteries: Normal. --Anterior cerebral arteries (ACA): Normal. --Middle cerebral arteries (MCA): Irregularity of the right MCA is likely exaggerated by motion. Normal left MCA. ANATOMIC VARIANTS: None MRA NECK FINDINGS No carotid stenosis. The visualized portions of the vertebral arteries are patent. IMPRESSION: 1. Small focus of acute ischemia within the left frontal white matter. No hemorrhage or mass effect. 2. No emergent large vessel occlusion or high-grade stenosis in the head or neck. Electronically Signed   By: Ulyses Jarred M.D.   On: 12/09/2019 19:29   MR ANGIO NECK WO CONTRAST  Result Date: 12/09/2019 CLINICAL DATA:  Stroke follow-up EXAM: MRI HEAD WITHOUT CONTRAST MRA HEAD WITHOUT CONTRAST MRA NECK WITHOUT CONTRAST TECHNIQUE: Multiplanar, multiecho pulse sequences of the brain and  surrounding structures were obtained without intravenous contrast. Angiographic images of the Circle of Willis were obtained using MRA technique without intravenous contrast. Angiographic images of the neck were obtained using MRA technique without intravenous contrast.  Carotid stenosis measurements (when applicable) are obtained utilizing NASCET criteria, using the distal internal carotid diameter as the denominator. COMPARISON:  None. FINDINGS: MRI HEAD FINDINGS Brain: There is a small focus of abnormal diffusion restriction within the left frontal white matter. No other diffusion abnormality. Early confluent hyperintense T2-weighted signal of the periventricular and deep white matter. Generalized volume loss. Old right cerebellar infarcts. No chronic microhemorrhage. Normal midline structures. Vascular: Major flow voids are preserved. Skull and upper cervical spine: Normal calvarium and skull base. Visualized upper cervical spine and soft tissues are normal. Sinuses/Orbits:No paranasal sinus fluid levels or advanced mucosal thickening. No mastoid or middle ear effusion. Normal orbits. MRA HEAD FINDINGS POSTERIOR CIRCULATION: --Vertebral arteries: Normal --Inferior cerebellar arteries: Normal. --Basilar artery: Normal. --Superior cerebellar arteries: Normal. --Posterior cerebral arteries: Normal. ANTERIOR CIRCULATION: --Intracranial internal carotid arteries: Normal. --Anterior cerebral arteries (ACA): Normal. --Middle cerebral arteries (MCA): Irregularity of the right MCA is likely exaggerated by motion. Normal left MCA. ANATOMIC VARIANTS: None MRA NECK FINDINGS No carotid stenosis. The visualized portions of the vertebral arteries are patent. IMPRESSION: 1. Small focus of acute ischemia within the left frontal white matter. No hemorrhage or mass effect. 2. No emergent large vessel occlusion or high-grade stenosis in the head or neck. Electronically Signed   By: Ulyses Jarred M.D.   On: 12/09/2019 19:29   MR BRAIN WO CONTRAST  Result Date: 12/09/2019 CLINICAL DATA:  Stroke follow-up EXAM: MRI HEAD WITHOUT CONTRAST MRA HEAD WITHOUT CONTRAST MRA NECK WITHOUT CONTRAST TECHNIQUE: Multiplanar, multiecho pulse sequences of the brain and surrounding structures were obtained without  intravenous contrast. Angiographic images of the Circle of Willis were obtained using MRA technique without intravenous contrast. Angiographic images of the neck were obtained using MRA technique without intravenous contrast. Carotid stenosis measurements (when applicable) are obtained utilizing NASCET criteria, using the distal internal carotid diameter as the denominator. COMPARISON:  None. FINDINGS: MRI HEAD FINDINGS Brain: There is a small focus of abnormal diffusion restriction within the left frontal white matter. No other diffusion abnormality. Early confluent hyperintense T2-weighted signal of the periventricular and deep white matter. Generalized volume loss. Old right cerebellar infarcts. No chronic microhemorrhage. Normal midline structures. Vascular: Major flow voids are preserved. Skull and upper cervical spine: Normal calvarium and skull base. Visualized upper cervical spine and soft tissues are normal. Sinuses/Orbits:No paranasal sinus fluid levels or advanced mucosal thickening. No mastoid or middle ear effusion. Normal orbits. MRA HEAD FINDINGS POSTERIOR CIRCULATION: --Vertebral arteries: Normal --Inferior cerebellar arteries: Normal. --Basilar artery: Normal. --Superior cerebellar arteries: Normal. --Posterior cerebral arteries: Normal. ANTERIOR CIRCULATION: --Intracranial internal carotid arteries: Normal. --Anterior cerebral arteries (ACA): Normal. --Middle cerebral arteries (MCA): Irregularity of the right MCA is likely exaggerated by motion. Normal left MCA. ANATOMIC VARIANTS: None MRA NECK FINDINGS No carotid stenosis. The visualized portions of the vertebral arteries are patent. IMPRESSION: 1. Small focus of acute ischemia within the left frontal white matter. No hemorrhage or mass effect. 2. No emergent large vessel occlusion or high-grade stenosis in the head or neck. Electronically Signed   By: Ulyses Jarred M.D.   On: 12/09/2019 19:29   ECHOCARDIOGRAM COMPLETE  Result Date:  12/10/2019    ECHOCARDIOGRAM REPORT   Patient Name:   Katherine Novak Date of Exam: 12/10/2019 Medical Rec #:  [id number]  Height:       59.0 in Accession #:    [id number]      Weight:       127.0 lb Date of Birth:  [birth year]       BSA:          1.521 m Patient Age:    13 years        BP:           111/80 mmHg Patient Gender: F               HR:           81 bpm. Exam Location:  Inpatient Procedure: 2D Echo, Cardiac Doppler and Color Doppler Indications:    CVA  History:        Patient has no prior history of Echocardiogram examinations.                 Stroke; Risk Factors:Hypertension and Dyslipidemia. Parkinsons                 disease.  Sonographer:    Dustin Flock Referring Phys: [id number] Candace Gallus MELVIN  Sonographer Comments: Technically difficult study due to poor echo windows and suboptimal parasternal window. IMPRESSIONS  1. Left ventricular ejection fraction, by estimation, is 60 to 65%. The left ventricle has normal function. The left ventricle has no regional wall motion abnormalities. Left ventricular diastolic parameters are consistent with Grade I diastolic dysfunction (impaired relaxation).  2. Right ventricular systolic function is normal. The right ventricular size is normal.  3. The mitral valve is normal in structure. Trivial mitral valve regurgitation.  4. The aortic valve is normal in structure. Aortic valve regurgitation is mild. FINDINGS  Left Ventricle: Left ventricular ejection fraction, by estimation, is 60 to 65%. The left ventricle has normal function. The left ventricle has no regional wall motion abnormalities. The left ventricular internal cavity size was normal in size. There is  no left ventricular hypertrophy. Left ventricular diastolic parameters are consistent with Grade I diastolic dysfunction (impaired relaxation). Right Ventricle: The right ventricular size is normal. No increase in right ventricular wall thickness. Right ventricular systolic function is normal.  Left Atrium: Left atrial size was normal in size. Right Atrium: Right atrial size was normal in size. Pericardium: Trivial pericardial effusion is present. Mitral Valve: The mitral valve is normal in structure. Trivial mitral valve regurgitation. Tricuspid Valve: The tricuspid valve is grossly normal. Tricuspid valve regurgitation is trivial. Aortic Valve: The aortic valve is normal in structure. Aortic valve regurgitation is mild. Aortic regurgitation PHT measures 464 msec. Pulmonic Valve: The pulmonic valve was normal in structure. Pulmonic valve regurgitation is not visualized. Aorta: The aortic root and ascending aorta are structurally normal, with no evidence of dilitation. IAS/Shunts: The atrial septum is grossly normal.  LEFT VENTRICLE PLAX 2D LVIDd:         3.50 cm  Diastology LVIDs:         2.50 cm  LV e' medial:    4.90 cm/s LV PW:         1.00 cm  LV E/e' medial:  13.3 LV IVS:        1.00 cm  LV e' lateral:   4.57 cm/s LVOT diam:     2.10 cm  LV E/e' lateral: 14.2 LV SV:         66 LV SV Index:   44 LVOT Area:     3.46 cm  RIGHT VENTRICLE RV Basal diam:  2.30 cm RV S prime:     4.68 cm/s TAPSE (M-mode): 2.1 cm LEFT ATRIUM             Index       RIGHT ATRIUM          Index LA diam:        2.90 cm 1.91 cm/m  RA Area:     7.40 cm LA Vol (A2C):   31.3 ml 20.58 ml/m RA Volume:   12.60 ml 8.29 ml/m LA Vol (A4C):   37.6 ml 24.73 ml/m LA Biplane Vol: 34.4 ml 22.62 ml/m  AORTIC VALVE LVOT Vmax:   102.00 cm/s LVOT Vmean:  63.800 cm/s LVOT VTI:    0.191 m AI PHT:      464 msec  AORTA Ao Root diam: 2.30 cm MITRAL VALVE MV Area (PHT): 3.99 cm     SHUNTS MV Decel Time: 190 msec     Systemic VTI:  0.19 m MV E velocity: 65.10 cm/s   Systemic Diam: 2.10 cm MV A velocity: 119.00 cm/s MV E/A ratio:  0.55 Mertie Moores MD Electronically signed by Mertie Moores MD Signature Date/Time: 12/10/2019/12:32:00 PM    Final    CT HEAD CODE STROKE WO CONTRAST`  Result Date: 12/09/2019 CLINICAL DATA:  Code stroke.  Acute  neuro deficit. EXAM: CT HEAD WITHOUT CONTRAST TECHNIQUE: Contiguous axial images were obtained from the base of the skull through the vertex without intravenous contrast. COMPARISON:  CT head 02/28/2009 FINDINGS: Brain: Generalized atrophy with progression. Moderate to extensive white matter disease bilaterally with progression. Small chronic infarct right occipital lobe has developed since the prior study. Negative for hydrocephalus. Negative for acute infarct, hemorrhage, mass. Vascular: Negative for hyperdense vessel Skull: Negative Sinuses/Orbits: Mild mucosal edema paranasal sinuses. Bilateral cataract extraction Other: None ASPECTS ([place] Stroke Program Early CT Score) - Ganglionic level infarction (caudate, lentiform nuclei, internal capsule, insula, M1-M3 cortex): 7 - Supraganglionic infarction (M4-M6 cortex): 3 Total score (0-10 with 10 being normal): 10 IMPRESSION: 1. No acute intracranial abnormality 2. ASPECTS is 10 3. Atrophy and chronic ischemic changes with progression since 2011 4. Code stroke imaging results were communicated on 12/09/2019 at 4:08 pm to provider Rory Percy via [place] Electronically Signed   By: Franchot Gallo M.D.   On: 12/09/2019 16:09    PHYSICAL EXAM  Temp:  [97.6 F (36.4 C)] 97.6 F (36.4 C) (12/03 1301) Pulse Rate:  [65-84] 71 (12/03 1301) Resp:  [11-25] 16 (12/03 1301) BP: (107-179)/(47-100) 132/75 (12/03 1301) SpO2:  [94 %-100 %] 97 % (12/03 1301)  General - Well nourished, well developed, in no apparent distress.  Ophthalmologic - fundi not visualized due to noncooperation.  Cardiovascular - Regular rhythm and rate.  Mental Status -  Level of arousal and orientation to place, and person were intact. However, she is not orientated to [place] year or month, but she is orientated to [place] religious year and month per son. Language including expression, naming, repetition, comprehension was assessed and found intact.  Cranial Nerves II - XII - II - Visual  field intact OU. III, IV, VI - Extraocular movements intact. V - Facial sensation intact bilaterally. VII - Facial movement intact bilaterally. VIII - Hearing & vestibular intact bilaterally. X - Palate elevates symmetrically. XI - Chin turning & shoulder shrug intact bilaterally. XII - Tongue protrusion intact.  Motor Strength - The patient's strength was symmetrical in all extremities and pronator drift was absent.  Bulk was normal and fasciculations were absent.  Motor Tone - Muscle tone was assessed at the neck and appendages and was normal.  Reflexes - The patient's reflexes were symmetrical in all extremities and she had no pathological reflexes.  Sensory - Light touch, temperature/pinprick were assessed and were symmetrical.    Coordination - The patient had normal movements in the hands with no ataxia or dysmetria.  Tremor was absent.  Gait and Station - deferred.   ASSESSMENT/PLAN Ms. Katherine Novak is a 85 y.o. female with history of Barrett's esophagus, Parkinson's disease on Sinemet with worsening symptoms over the past few weeks to months, RBD, depression, presenting with garbled speech and some right-sided weakness.    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 Progressive Small vessel disease & Atrophy. ASPECTS 10.     MRI  Small L frontal WM and insular cortex infarcts  MRA head & neck no LVO or high-grade stenosis   2D Echo EF 60-65%. No source of embolus   Recommend 30 day cardiac event monitoring to rule out afib. If neg, may consider loop recorder insertion.   LDL 112  HgbA1c 6.3  VTE prophylaxis - Lovenox 40 mg sq daily   No antithrombotic prior to admission, now on ASA 81 and plavix 75 DAPT for 3 weeks and then ASA alone.  Therapy recommendations:  HH PT  Disposition:  Return home  Palpitation   Pt reported infrequent heart palpitation,  lasting 92min each  Son not sure if pt really understand what exactly palpitation means  Recommend 30 day cardiac event monitoring to rule out afib. If neg, may consider loop recorder insertion.  Hypertension  Home meds:  Metoprolol 25 bid  Stable . Long-term BP goal normotensive  Hyperlipidemia  Home meds:  crestor 5  LDL 112, goal < 70  Now on crestor 10  Continue statin at discharge  Other Stroke Risk Factors  Advanced Age >/= 44   Other Active Problems  CLL (Mohammad)  Parkinsons Disease with RBD on sinemet - following with Dr. Carles Collet  Osteoporosis on Fosamax  Barrett's esophagus  Hospital day # 1  Neurology will sign off. Please call with questions. Pt will follow up with Dr. Carles Collet at [facility] in about 4 weeks. Thanks for the consult.  Rosalin Hawking, MD PhD Stroke Neurology 12/10/2019 5:46 PM    To contact Stroke Continuity provider, please refer to [email or web address] After hours, contact General Neurology

## 2019-12-10 NOTE — ED Notes (Signed)
Pt transported to echo 

## 2019-12-10 NOTE — Evaluation (Signed)
Physical Therapy Evaluation Patient Details Name: ZILDA NO MRN: [id number] DOB: 1934-12-05 Today's Date: 12/10/2019   History of Present Illness  Katherine Novak is a 85 y.o. female with medical history significant of Parkinson's disease, Barrett's esophagus, CLL, GERD, AAA, HLD who presents following episodes of garbled speech and right-sided weakness.  Clinical Impression  Pt admitted with/for s/s of stroke.  Pt needing minimal assist for OOB mobility in this environment without use of a RW.  Pt currently limited functionally due to the problems listed below.  (see problems list.)  Pt will benefit from PT to maximize function and safety to be able to get home safely with available assist.     Follow Up Recommendations Home health PT;Supervision - Intermittent    Equipment Recommendations  None recommended by PT    Recommendations for Other Services       Precautions / Restrictions Precautions Precautions: Fall      Mobility  Bed Mobility Overal bed mobility: Needs Assistance Bed Mobility: Supine to Sit;Sit to Supine     Supine to sit: Min guard Sit to supine: Min assist   General bed mobility comments: difficulty with the height of the stretcher    Transfers Overall transfer level: Needs assistance   Transfers: Sit to/from Stand Sit to Stand: Min guard            Ambulation/Gait Ambulation/Gait assistance: Min assist Gait Distance (Feet): 120 Feet Assistive device: IV Pole (no RW present) Gait Pattern/deviations: Step-through pattern   Gait velocity interpretation: <1.8 ft/sec, indicate of risk for recurrent falls General Gait Details: short mildly halted steps.  Pt states fairly close to normal lately  Stairs            Wheelchair Mobility    Modified Rankin (Stroke Patients Only) Modified Rankin (Stroke Patients Only) Pre-Morbid Rankin Score: No significant disability Modified Rankin: No significant disability     Balance Overall  balance assessment: Needs assistance   Sitting balance-Leahy Scale: Fair       Standing balance-Leahy Scale: Fair                               Pertinent Vitals/Pain Pain Assessment: No/denies pain    Home Living Family/patient expects to be discharged to:: Private residence Living Arrangements: Children (son/dtr in law and 1 grand child in high school) Available Help at Discharge: Family;Available 24 hours/day Type of Home: House Home Access: Stairs to enter Entrance Stairs-Rails: Psychiatric nurse of Steps: 4 Home Layout: One level Home Equipment: Walker - 2 wheels      Prior Function Level of Independence: Independent with assistive device(s)         Comments: needs help for showering     Hand Dominance        Extremity/Trunk Assessment   Upper Extremity Assessment Upper Extremity Assessment: Overall WFL for tasks assessed (general proximal weakness bil, non focal, but functional)    Lower Extremity Assessment Lower Extremity Assessment: Overall WFL for tasks assessed (L LE mildly weaker than right, mild incoordination bil)       Communication   Communication: Prefers language other than [place];Other (comment) (friend of the family states she is becoming more intelligibl)  Cognition Arousal/Alertness: Awake/alert Behavior During Therapy: WFL for tasks assessed/performed Overall Cognitive Status: Difficult to assess  General Comments: family friend interpreting states she appears cognitively intact      General Comments General comments (skin integrity, edema, etc.): vss    Exercises     Assessment/Plan    PT Assessment Patient needs continued PT services  PT Problem List Decreased strength;Decreased activity tolerance;Decreased balance;Decreased mobility       PT Treatment Interventions Gait training;Functional mobility training;Balance training;Patient/family  education;DME instruction;Therapeutic activities    PT Goals (Current goals can be found in the Care Plan section)  Acute Rehab PT Goals Patient Stated Goal: home soon, pt's son want HHPT PT Goal Formulation: With patient Time For Goal Achievement: 12/17/19 Potential to Achieve Goals: Good    Frequency Min 3X/week   Barriers to discharge        Co-evaluation               AM-PAC PT "6 Clicks" Mobility  Outcome Measure Help needed turning from your back to your side while in a flat bed without using bedrails?: A Little Help needed moving from lying on your back to sitting on the side of a flat bed without using bedrails?: A Little Help needed moving to and from a bed to a chair (including a wheelchair)?: A Little Help needed standing up from a chair using your arms (e.g., wheelchair or bedside chair)?: A Little Help needed to walk in hospital room?: A Little Help needed climbing 3-5 steps with a railing? : A Lot 6 Click Score: 17    End of Session   Activity Tolerance: Patient tolerated treatment well Patient left: in bed;with call bell/phone within reach;with family/visitor present Nurse Communication: Mobility status PT Visit Diagnosis: Unsteadiness on feet (R26.81);Other symptoms and signs involving the nervous system (R29.898)    Time: 3943-2003 PT Time Calculation (min) (ACUTE ONLY): 37 min   Charges:   PT Evaluation $PT Eval Moderate Complexity: 1 Mod PT Treatments $Gait Training: 8-22 mins        12/10/2019  Ginger Carne., PT Acute Rehabilitation Services [phone]  (pager) [phone]  (office)  Katherine Novak 12/10/2019, 12:49 PM

## 2019-12-10 NOTE — Progress Notes (Signed)
Wheelchair:  Patient suffers from weakness which impairs their ability to perform daily activities like ambulating and dressing in the home. A walker will not resolve  issue with performing activities of daily living. A wheelchair will allow patient to safely perform daily activities. Patient is not able to propel themselves in the home using a standard weight wheelchair due to weakness. Patient can self propel in the lightweight wheelchair. Length of need lifetime.  Accessories: elevating leg rests (ELRs), wheel locks, extensions and anti-tippers.

## 2019-12-10 NOTE — Discharge Summary (Signed)
Physician Discharge Summary   Katherine Novak OAC:[id number] DOB: 11/08/1934 DOA: 12/09/2019  PCP: [name], MD  Admit date: 12/09/2019 Discharge date: 12/10/2019  Admitted From: home Disposition:  home Admitting physician: Dr. Trilby Drummer Discharging physician: Dwyane Dee, MD  Recommendations for Outpatient Follow-up:  1. Follow-up with neurology  Home Health: Physical therapy Equipment/Devices: Wheelchair  Patient discharged to home in Discharge Condition: stable CODE STATUS: Full Diet recommendation:  Diet Orders (From admission, onward)    Start     Ordered   12/10/19 0000  Diet - low sodium heart healthy        12/10/19 1428   12/09/19 1949  DIET SOFT Room service appropriate? Yes; Fluid consistency: Thin  Diet effective now       Question Answer Comment  Room service appropriate? Yes   Fluid consistency: Thin      12/09/19 1948          Hospital Course:  Katherine Novak is a 85 y.o. female with medical history significant of Parkinson's disease, Barrett's esophagus, CLL, GERD, AAA, HLD who presented with complaints of dysarthria and right-sided weakness. Patient's family members utilized for translation due to language barrier of [place].  Did have some worsening of her tremors from her underlying Parkinson's over the past several months and her medications had also been recently adjusted.  The night prior to admission she began having change in her speech with right-sided weakness requiring more assistance.  Due to this, family discussed her symptoms with her neurologist who also recommended patient present to the ER for further evaluation.  CT head was unremarkable with no acute findings.  MRI brain revealed a small acute stroke in the left frontal white matter.  MRA head/neck was negative for large vessel occlusion or high-grade stenosis in the head or neck. Neurology was also consulted and patient was recommended to continue on aspirin and Plavix for 3  weeks then Plavix alone.  She will follow up with outpatient neurology also. She was evaluated by physical therapy and was recommended for home health PT at discharge.   The patient's chronic medical conditions were treated accordingly per the patient's home medication regimen except as noted.  On day of discharge, patient was felt deemed stable for discharge. Patient/family member advised to call PCP or come back to ER if needed.   Principal Diagnosis: Acute CVA (cerebrovascular accident) [facility])  Discharge Diagnoses: Active Hospital Problems   Diagnosis Date Noted  . Acute CVA (cerebrovascular accident) ([place]) 12/09/2019  . Parkinson's disease ([place]) 01/22/2017  . Hypercholesterolemia     Resolved Hospital Problems  No resolved problems to display.    Discharge Instructions    Diet - low sodium heart healthy   Complete by: As directed    Increase activity slowly   Complete by: As directed      Allergies as of 12/10/2019      Reactions   Sulfa Antibiotics Swelling   Angioedema of lips , throat      Medication List    STOP taking these medications   HYDROcodone-acetaminophen 10-325 MG tablet Commonly known as: NORCO     TAKE these medications   acetaminophen 325 MG tablet Commonly known as: TYLENOL Take 650 mg by mouth every 6 (six) hours as needed for mild pain.   alendronate 70 MG tablet Commonly known as: FOSAMAX Take 70 mg by mouth every Wednesday.   aspirin 81 MG EC tablet Take 1 tablet (81 mg total) by mouth daily for 21 days.  Swallow whole. Start taking on: December 11, 2019   carbidopa-levodopa 50-200 MG tablet Commonly known as: SINEMET CR TAKE 1 TABLET BY MOUTH EVERYDAY AT BEDTIME What changed:   how much to take  how to take this  when to take this  additional instructions   carbidopa-levodopa 25-100 MG tablet Commonly known as: SINEMET IR Take 2 tablets by mouth 4 (four) times daily. 2 tablets at 6 AM/2 tablet at 10 AM/2 tablets at 2 PM/2  tablet at 6 PM What changed:   when to take this  additional instructions   clopidogrel 75 MG tablet Commonly known as: PLAVIX Take 1 tablet (75 mg total) by mouth daily.   furosemide 20 MG tablet Commonly known as: LASIX Take 20 mg by mouth daily as needed for fluid.   metoprolol tartrate 25 MG tablet Commonly known as: LOPRESSOR Take 25 mg by mouth 2 (two) times daily.   mirtazapine 15 MG tablet Commonly known as: Remeron Take 1 tablet (15 mg total) by mouth at bedtime.   omeprazole 20 MG capsule Commonly known as: PRILOSEC Take 20 mg by mouth daily.   rosuvastatin 10 MG tablet Commonly known as: CRESTOR Take 1 tablet (10 mg total) by mouth daily. Start taking on: December 11, 2019 What changed:   medication strength  how much to take            Durable Medical Equipment  (From admission, onward)         Start     Ordered   12/10/19 1446  For home use only DME lightweight manual wheelchair with seat cushion  Once       Comments: Patient suffers from weakness which impairs their ability to perform daily activities like ambulating and dressing in the home.  A walker will not resolve  issue with performing activities of daily living. A wheelchair will allow patient to safely perform daily activities. Patient is not able to propel themselves in the home using a standard weight wheelchair due to weakness. Patient can self propel in the lightweight wheelchair. Length of need lifetime. Accessories: elevating leg rests (ELRs), wheel locks, extensions and anti-tippers.   12/10/19 1446          Follow-up Information    Tat, Eustace Quail, DO. Schedule an appointment as soon as possible for a visit in 4 week(s).   Specialty: Neurology Contact information: [place] [address] [address] Follow up.   Why: The home health agency will contact you for the first home visit Contact  information: [phone]             Allergies  Allergen Reactions  . Sulfa Antibiotics Swelling    Angioedema of lips , throat    Consultations: Neurology  Discharge Exam: BP 132/75 (BP Location: Right Arm)   Pulse 71   Temp 97.6 F (36.4 C) (Oral)   Resp 16   Ht 4\' 11"  (1.499 m)   Wt 57.6 kg   SpO2 97%   BMI 25.65 kg/m  General appearance: alert, cooperative and no distress Head: Normocephalic, without obvious abnormality, atraumatic Eyes: EOMI Lungs: clear to auscultation bilaterally Heart: regular rate and rhythm and S1, S2 normal Abdomen: normal findings: bowel sounds normal and soft, non-tender Extremities: No edema Skin: mobility and turgor normal Neurologic: No focal deficits, global weakness appreciated but mostly symmetric.  Sensation intact throughout  The results of significant diagnostics from this  hospitalization (including imaging, microbiology, ancillary and laboratory) are listed below for reference.   Microbiology: Recent Results (from the past 240 hour(s))  Resp Panel by RT-PCR (Flu A&B, Covid) Nasopharyngeal Swab     Status: None   Collection Time: 12/09/19  7:08 PM   Specimen: Nasopharyngeal Swab; Nasopharyngeal(NP) swabs in vial transport medium  Result Value Ref Range Status   SARS Coronavirus 2 by RT PCR NEGATIVE NEGATIVE Final    Comment: (NOTE) SARS-CoV-2 target nucleic acids are NOT DETECTED.  The SARS-CoV-2 RNA is generally detectable in upper respiratory specimens during the acute phase of infection. The lowest concentration of SARS-CoV-2 viral copies this assay can detect is 138 copies/mL. A negative result does not preclude SARS-Cov-2 infection and should not be used as the sole basis for treatment or other patient management decisions. A negative result may occur with  improper specimen collection/handling, submission of specimen other than nasopharyngeal swab, presence of viral mutation(s) within the areas targeted by this  assay, and inadequate number of viral copies(<138 copies/mL). A negative result must be combined with clinical observations, patient history, and epidemiological information. The expected result is Negative.  Fact Sheet for Patients:  EntrepreneurPulse.com.au  Fact Sheet for Healthcare Providers:  IncredibleEmployment.be  This test is no t yet approved or cleared by the [place] FDA and  has been authorized for detection and/or diagnosis of SARS-CoV-2 by FDA under an Emergency Use Authorization (EUA). This EUA will remain  in effect (meaning this test can be used) for the duration of the COVID-19 declaration under Section 564(b)(1) of the Act, 21 U.S.C.section 360bbb-3(b)(1), unless the authorization is terminated  or revoked sooner.       Influenza A by PCR NEGATIVE NEGATIVE Final   Influenza B by PCR NEGATIVE NEGATIVE Final    Comment: (NOTE) The Xpert Xpress SARS-CoV-2/FLU/RSV plus assay is intended as an aid in the diagnosis of influenza from Nasopharyngeal swab specimens and should not be used as a sole basis for treatment. Nasal washings and aspirates are unacceptable for Xpert Xpress SARS-CoV-2/FLU/RSV testing.  Fact Sheet for Patients: EntrepreneurPulse.com.au  Fact Sheet for Healthcare Providers: IncredibleEmployment.be  This test is not yet approved or cleared by the [place] FDA and has been authorized for detection and/or diagnosis of SARS-CoV-2 by FDA under an Emergency Use Authorization (EUA). This EUA will remain in effect (meaning this test can be used) for the duration of the COVID-19 declaration under Section 564(b)(1) of the Act, 21 U.S.C. section 360bbb-3(b)(1), unless the authorization is terminated or revoked.  Performed at [facility], [place] [address] [place], [place] [address]      Labs: BNP (last 3 results) No results for input(s): BNP in the last 8760  hours. Basic Metabolic Panel: Recent Labs  Lab 12/09/19 1605  NA 143  K 3.8  CL 105  CO2 28  GLUCOSE 103*  BUN 8  CREATININE 0.68  CALCIUM 9.5   Liver Function Tests: Recent Labs  Lab 12/09/19 1605  AST 13*  ALT <5  ALKPHOS 60  BILITOT 0.5  PROT 6.0*  ALBUMIN 3.8   No results for input(s): LIPASE, AMYLASE in the last 168 hours. No results for input(s): AMMONIA in the last 168 hours. CBC: Recent Labs  Lab 12/09/19 1605  WBC 5.4  NEUTROABS 3.1  HGB 10.8*  HCT 36.7  MCV 86.4  PLT 168   Cardiac Enzymes: No results for input(s): CKTOTAL, CKMB, CKMBINDEX, TROPONINI in the last 168 hours. BNP: Invalid input(s): POCBNP CBG:  No results for input(s): GLUCAP in the last 168 hours. D-Dimer No results for input(s): DDIMER in the last 72 hours. Hgb A1c Recent Labs    12/10/19 0326  HGBA1C 6.3*   Lipid Profile Recent Labs    12/10/19 0326  CHOL 196  HDL 60  LDLCALC 112*  TRIG 121  CHOLHDL 3.3   Thyroid function studies No results for input(s): TSH, T4TOTAL, T3FREE, THYROIDAB in the last 72 hours.  Invalid input(s): FREET3 Anemia work up No results for input(s): VITAMINB12, FOLATE, FERRITIN, TIBC, IRON, RETICCTPCT in the last 72 hours. Urinalysis    Component Value Date/Time   COLORURINE YELLOW 12/10/2019 0707   APPEARANCEUR CLEAR 12/10/2019 0707   LABSPEC 1.012 12/10/2019 0707   PHURINE 7.0 12/10/2019 0707   GLUCOSEU NEGATIVE 12/10/2019 0707   HGBUR NEGATIVE 12/10/2019 0707   BILIRUBINUR NEGATIVE 12/10/2019 0707   KETONESUR 5 (A) 12/10/2019 0707   PROTEINUR NEGATIVE 12/10/2019 0707   UROBILINOGEN 0.2 07/28/2013 1013   NITRITE NEGATIVE 12/10/2019 0707   LEUKOCYTESUR TRACE (A) 12/10/2019 0707   Sepsis Labs Invalid input(s): PROCALCITONIN,  WBC,  LACTICIDVEN Microbiology Recent Results (from the past 240 hour(s))  Resp Panel by RT-PCR (Flu A&B, Covid) Nasopharyngeal Swab     Status: None   Collection Time: 12/09/19  7:08 PM   Specimen:  Nasopharyngeal Swab; Nasopharyngeal(NP) swabs in vial transport medium  Result Value Ref Range Status   SARS Coronavirus 2 by RT PCR NEGATIVE NEGATIVE Final    Comment: (NOTE) SARS-CoV-2 target nucleic acids are NOT DETECTED.  The SARS-CoV-2 RNA is generally detectable in upper respiratory specimens during the acute phase of infection. The lowest concentration of SARS-CoV-2 viral copies this assay can detect is 138 copies/mL. A negative result does not preclude SARS-Cov-2 infection and should not be used as the sole basis for treatment or other patient management decisions. A negative result may occur with  improper specimen collection/handling, submission of specimen other than nasopharyngeal swab, presence of viral mutation(s) within the areas targeted by this assay, and inadequate number of viral copies(<138 copies/mL). A negative result must be combined with clinical observations, patient history, and epidemiological information. The expected result is Negative.  Fact Sheet for Patients:  EntrepreneurPulse.com.au  Fact Sheet for Healthcare Providers:  IncredibleEmployment.be  This test is no t yet approved or cleared by the [place] FDA and  has been authorized for detection and/or diagnosis of SARS-CoV-2 by FDA under an Emergency Use Authorization (EUA). This EUA will remain  in effect (meaning this test can be used) for the duration of the COVID-19 declaration under Section 564(b)(1) of the Act, 21 U.S.C.section 360bbb-3(b)(1), unless the authorization is terminated  or revoked sooner.       Influenza A by PCR NEGATIVE NEGATIVE Final   Influenza B by PCR NEGATIVE NEGATIVE Final    Comment: (NOTE) The Xpert Xpress SARS-CoV-2/FLU/RSV plus assay is intended as an aid in the diagnosis of influenza from Nasopharyngeal swab specimens and should not be used as a sole basis for treatment. Nasal washings and aspirates are unacceptable for  Xpert Xpress SARS-CoV-2/FLU/RSV testing.  Fact Sheet for Patients: EntrepreneurPulse.com.au  Fact Sheet for Healthcare Providers: IncredibleEmployment.be  This test is not yet approved or cleared by the [place] FDA and has been authorized for detection and/or diagnosis of SARS-CoV-2 by FDA under an Emergency Use Authorization (EUA). This EUA will remain in effect (meaning this test can be used) for the duration of the COVID-19 declaration under Section 564(b)(1) of the Act,  21 U.S.C. section 360bbb-3(b)(1), unless the authorization is terminated or revoked.  Performed at [facility], [place] [address] [place], [place] [address]     Procedures/Studies: MR ANGIO HEAD WO CONTRAST  Result Date: 12/09/2019 CLINICAL DATA:  Stroke follow-up EXAM: MRI HEAD WITHOUT CONTRAST MRA HEAD WITHOUT CONTRAST MRA NECK WITHOUT CONTRAST TECHNIQUE: Multiplanar, multiecho pulse sequences of the brain and surrounding structures were obtained without intravenous contrast. Angiographic images of the Circle of Willis were obtained using MRA technique without intravenous contrast. Angiographic images of the neck were obtained using MRA technique without intravenous contrast. Carotid stenosis measurements (when applicable) are obtained utilizing NASCET criteria, using the distal internal carotid diameter as the denominator. COMPARISON:  None. FINDINGS: MRI HEAD FINDINGS Brain: There is a small focus of abnormal diffusion restriction within the left frontal white matter. No other diffusion abnormality. Early confluent hyperintense T2-weighted signal of the periventricular and deep white matter. Generalized volume loss. Old right cerebellar infarcts. No chronic microhemorrhage. Normal midline structures. Vascular: Major flow voids are preserved. Skull and upper cervical spine: Normal calvarium and skull base. Visualized upper cervical spine and soft tissues are normal.  Sinuses/Orbits:No paranasal sinus fluid levels or advanced mucosal thickening. No mastoid or middle ear effusion. Normal orbits. MRA HEAD FINDINGS POSTERIOR CIRCULATION: --Vertebral arteries: Normal --Inferior cerebellar arteries: Normal. --Basilar artery: Normal. --Superior cerebellar arteries: Normal. --Posterior cerebral arteries: Normal. ANTERIOR CIRCULATION: --Intracranial internal carotid arteries: Normal. --Anterior cerebral arteries (ACA): Normal. --Middle cerebral arteries (MCA): Irregularity of the right MCA is likely exaggerated by motion. Normal left MCA. ANATOMIC VARIANTS: None MRA NECK FINDINGS No carotid stenosis. The visualized portions of the vertebral arteries are patent. IMPRESSION: 1. Small focus of acute ischemia within the left frontal white matter. No hemorrhage or mass effect. 2. No emergent large vessel occlusion or high-grade stenosis in the head or neck. Electronically Signed   By: Ulyses Jarred M.D.   On: 12/09/2019 19:29   MR ANGIO NECK WO CONTRAST  Result Date: 12/09/2019 CLINICAL DATA:  Stroke follow-up EXAM: MRI HEAD WITHOUT CONTRAST MRA HEAD WITHOUT CONTRAST MRA NECK WITHOUT CONTRAST TECHNIQUE: Multiplanar, multiecho pulse sequences of the brain and surrounding structures were obtained without intravenous contrast. Angiographic images of the Circle of Willis were obtained using MRA technique without intravenous contrast. Angiographic images of the neck were obtained using MRA technique without intravenous contrast. Carotid stenosis measurements (when applicable) are obtained utilizing NASCET criteria, using the distal internal carotid diameter as the denominator. COMPARISON:  None. FINDINGS: MRI HEAD FINDINGS Brain: There is a small focus of abnormal diffusion restriction within the left frontal white matter. No other diffusion abnormality. Early confluent hyperintense T2-weighted signal of the periventricular and deep white matter. Generalized volume loss. Old right cerebellar  infarcts. No chronic microhemorrhage. Normal midline structures. Vascular: Major flow voids are preserved. Skull and upper cervical spine: Normal calvarium and skull base. Visualized upper cervical spine and soft tissues are normal. Sinuses/Orbits:No paranasal sinus fluid levels or advanced mucosal thickening. No mastoid or middle ear effusion. Normal orbits. MRA HEAD FINDINGS POSTERIOR CIRCULATION: --Vertebral arteries: Normal --Inferior cerebellar arteries: Normal. --Basilar artery: Normal. --Superior cerebellar arteries: Normal. --Posterior cerebral arteries: Normal. ANTERIOR CIRCULATION: --Intracranial internal carotid arteries: Normal. --Anterior cerebral arteries (ACA): Normal. --Middle cerebral arteries (MCA): Irregularity of the right MCA is likely exaggerated by motion. Normal left MCA. ANATOMIC VARIANTS: None MRA NECK FINDINGS No carotid stenosis. The visualized portions of the vertebral arteries are patent. IMPRESSION: 1. Small focus of acute ischemia within the left frontal white  matter. No hemorrhage or mass effect. 2. No emergent large vessel occlusion or high-grade stenosis in the head or neck. Electronically Signed   By: Ulyses Jarred M.D.   On: 12/09/2019 19:29   MR BRAIN WO CONTRAST  Result Date: 12/09/2019 CLINICAL DATA:  Stroke follow-up EXAM: MRI HEAD WITHOUT CONTRAST MRA HEAD WITHOUT CONTRAST MRA NECK WITHOUT CONTRAST TECHNIQUE: Multiplanar, multiecho pulse sequences of the brain and surrounding structures were obtained without intravenous contrast. Angiographic images of the Circle of Willis were obtained using MRA technique without intravenous contrast. Angiographic images of the neck were obtained using MRA technique without intravenous contrast. Carotid stenosis measurements (when applicable) are obtained utilizing NASCET criteria, using the distal internal carotid diameter as the denominator. COMPARISON:  None. FINDINGS: MRI HEAD FINDINGS Brain: There is a small focus of abnormal  diffusion restriction within the left frontal white matter. No other diffusion abnormality. Early confluent hyperintense T2-weighted signal of the periventricular and deep white matter. Generalized volume loss. Old right cerebellar infarcts. No chronic microhemorrhage. Normal midline structures. Vascular: Major flow voids are preserved. Skull and upper cervical spine: Normal calvarium and skull base. Visualized upper cervical spine and soft tissues are normal. Sinuses/Orbits:No paranasal sinus fluid levels or advanced mucosal thickening. No mastoid or middle ear effusion. Normal orbits. MRA HEAD FINDINGS POSTERIOR CIRCULATION: --Vertebral arteries: Normal --Inferior cerebellar arteries: Normal. --Basilar artery: Normal. --Superior cerebellar arteries: Normal. --Posterior cerebral arteries: Normal. ANTERIOR CIRCULATION: --Intracranial internal carotid arteries: Normal. --Anterior cerebral arteries (ACA): Normal. --Middle cerebral arteries (MCA): Irregularity of the right MCA is likely exaggerated by motion. Normal left MCA. ANATOMIC VARIANTS: None MRA NECK FINDINGS No carotid stenosis. The visualized portions of the vertebral arteries are patent. IMPRESSION: 1. Small focus of acute ischemia within the left frontal white matter. No hemorrhage or mass effect. 2. No emergent large vessel occlusion or high-grade stenosis in the head or neck. Electronically Signed   By: Ulyses Jarred M.D.   On: 12/09/2019 19:29   ECHOCARDIOGRAM COMPLETE  Result Date: 12/10/2019    ECHOCARDIOGRAM REPORT   Patient Name:   Katherine Novak Date of Exam: 12/10/2019 Medical Rec #:  [id number]       Height:       59.0 in Accession #:    [id number]      Weight:       127.0 lb Date of Birth:  Jul 21, 1934       BSA:          1.521 m Patient Age:    35 years        BP:           111/80 mmHg Patient Gender: F               HR:           81 bpm. Exam Location:  Inpatient Procedure: 2D Echo, Cardiac Doppler and Color Doppler Indications:    CVA   History:        Patient has no prior history of Echocardiogram examinations.                 Stroke; Risk Factors:Hypertension and Dyslipidemia. Parkinsons                 disease.  Sonographer:    Dustin Flock Referring Phys: [id number] Candace Gallus MELVIN  Sonographer Comments: Technically difficult study due to poor echo windows and suboptimal parasternal window. IMPRESSIONS  1. Left ventricular ejection fraction, by estimation, is 60 to 65%. The left ventricle  has normal function. The left ventricle has no regional wall motion abnormalities. Left ventricular diastolic parameters are consistent with Grade I diastolic dysfunction (impaired relaxation).  2. Right ventricular systolic function is normal. The right ventricular size is normal.  3. The mitral valve is normal in structure. Trivial mitral valve regurgitation.  4. The aortic valve is normal in structure. Aortic valve regurgitation is mild. FINDINGS  Left Ventricle: Left ventricular ejection fraction, by estimation, is 60 to 65%. The left ventricle has normal function. The left ventricle has no regional wall motion abnormalities. The left ventricular internal cavity size was normal in size. There is  no left ventricular hypertrophy. Left ventricular diastolic parameters are consistent with Grade I diastolic dysfunction (impaired relaxation). Right Ventricle: The right ventricular size is normal. No increase in right ventricular wall thickness. Right ventricular systolic function is normal. Left Atrium: Left atrial size was normal in size. Right Atrium: Right atrial size was normal in size. Pericardium: Trivial pericardial effusion is present. Mitral Valve: The mitral valve is normal in structure. Trivial mitral valve regurgitation. Tricuspid Valve: The tricuspid valve is grossly normal. Tricuspid valve regurgitation is trivial. Aortic Valve: The aortic valve is normal in structure. Aortic valve regurgitation is mild. Aortic regurgitation PHT measures 464  msec. Pulmonic Valve: The pulmonic valve was normal in structure. Pulmonic valve regurgitation is not visualized. Aorta: The aortic root and ascending aorta are structurally normal, with no evidence of dilitation. IAS/Shunts: The atrial septum is grossly normal.  LEFT VENTRICLE PLAX 2D LVIDd:         3.50 cm  Diastology LVIDs:         2.50 cm  LV e' medial:    4.90 cm/s LV PW:         1.00 cm  LV E/e' medial:  13.3 LV IVS:        1.00 cm  LV e' lateral:   4.57 cm/s LVOT diam:     2.10 cm  LV E/e' lateral: 14.2 LV SV:         66 LV SV Index:   44 LVOT Area:     3.46 cm  RIGHT VENTRICLE RV Basal diam:  2.30 cm RV S prime:     4.68 cm/s TAPSE (M-mode): 2.1 cm LEFT ATRIUM             Index       RIGHT ATRIUM          Index LA diam:        2.90 cm 1.91 cm/m  RA Area:     7.40 cm LA Vol (A2C):   31.3 ml 20.58 ml/m RA Volume:   12.60 ml 8.29 ml/m LA Vol (A4C):   37.6 ml 24.73 ml/m LA Biplane Vol: 34.4 ml 22.62 ml/m  AORTIC VALVE LVOT Vmax:   102.00 cm/s LVOT Vmean:  63.800 cm/s LVOT VTI:    0.191 m AI PHT:      464 msec  AORTA Ao Root diam: 2.30 cm MITRAL VALVE MV Area (PHT): 3.99 cm     SHUNTS MV Decel Time: 190 msec     Systemic VTI:  0.19 m MV E velocity: 65.10 cm/s   Systemic Diam: 2.10 cm MV A velocity: 119.00 cm/s MV E/A ratio:  0.55 Mertie Moores MD Electronically signed by Mertie Moores MD Signature Date/Time: 12/10/2019/12:32:00 PM    Final    CT HEAD CODE STROKE WO CONTRAST`  Result Date: 12/09/2019 CLINICAL DATA:  Code stroke.  Acute neuro deficit.  EXAM: CT HEAD WITHOUT CONTRAST TECHNIQUE: Contiguous axial images were obtained from the base of the skull through the vertex without intravenous contrast. COMPARISON:  CT head 02/28/2009 FINDINGS: Brain: Generalized atrophy with progression. Moderate to extensive white matter disease bilaterally with progression. Small chronic infarct right occipital lobe has developed since the prior study. Negative for hydrocephalus. Negative for acute infarct,  hemorrhage, mass. Vascular: Negative for hyperdense vessel Skull: Negative Sinuses/Orbits: Mild mucosal edema paranasal sinuses. Bilateral cataract extraction Other: None ASPECTS ([place] Stroke Program Early CT Score) - Ganglionic level infarction (caudate, lentiform nuclei, internal capsule, insula, M1-M3 cortex): 7 - Supraganglionic infarction (M4-M6 cortex): 3 Total score (0-10 with 10 being normal): 10 IMPRESSION: 1. No acute intracranial abnormality 2. ASPECTS is 10 3. Atrophy and chronic ischemic changes with progression since 2011 4. Code stroke imaging results were communicated on 12/09/2019 at 4:08 pm to provider Rory Percy via [facility] Electronically Signed   By: Franchot Gallo M.D.   On: 12/09/2019 16:09     Time coordinating discharge: Over 30 minutes    Dwyane Dee, MD  [facility] 12/10/2019, 4:15 PM

## 2019-12-10 NOTE — Care Management CC44 (Signed)
Condition Code 44 Documentation Completed  Patient Details  Name: KNIYAH KHUN MRN: [id number] Date of Birth: 1934-09-11   Condition Code 44 given:  Yes Patient signature on Condition Code 44 notice:  Yes Documentation of 2 MD's agreement:  Yes Code 44 added to claim:  Yes    Pollie Friar, RN 12/10/2019, 2:45 PM

## 2019-12-10 NOTE — Care Management Obs Status (Signed)
[facility] NOTIFICATION   Patient Details  Name: MARENE GILLIAM MRN: [id number] Date of Birth: October 04, 1934   Medicare Observation Status Notification Given:  Yes    Pollie Friar, RN 12/10/2019, 2:45 PM

## 2019-12-10 NOTE — Progress Notes (Signed)
  Echocardiogram 2D Echocardiogram has been performed.  Katherine Novak 12/10/2019, 10:23 AM

## 2019-12-13 ENCOUNTER — Telehealth: Payer: Self-pay | Admitting: Neurology

## 2019-12-13 ENCOUNTER — Telehealth: Payer: Self-pay

## 2019-12-13 NOTE — Telephone Encounter (Signed)
TCM completed and has follow up Wednesday 12/15/2019. FYI

## 2019-12-13 NOTE — Telephone Encounter (Signed)
Patient is scheduled on 12/15/19 at 10:15.

## 2019-12-13 NOTE — Telephone Encounter (Addendum)
Transition Care Management Phone Call:  How have you been since you were released from the hospital? no  Do you understand why you were in the hospital? CVA  Do you understanding the discharge instructions? Yes  Items reviewed: Medications: Asa 81mg  1 tab po qd, simvastatin 10mg  1 tab po qd, plavix 75mg  1 tab po qd new medication added  Allergies:Sulfa Dietary changes:no Referrals reviewed:Home health is in home  Functional questionare: ADLs (independent/dependent) yes  Ambulation?yes  Bathing/hygiene? yes  Feeding/Food prep? yes  Continence? no  Grooming/dressing? yes  Transportation Any patient concerns? no  Confirmed appointments and set up follow up with PCP:  Confirmed with patient if condition begins to worsen call PCP or go to the ER. Pt. Was given the office number and encouraged to call back with questions or concerns.

## 2019-12-13 NOTE — Telephone Encounter (Signed)
Please call pt/son today and do a post hospital TCM note and document in chart and make TCM appointment with me on wed at 10:15

## 2019-12-13 NOTE — Progress Notes (Signed)
TRANSITION CARE MANAGEMENT NOTE Virtual Visit Via Video   The purpose of this virtual visit is to provide medical care while limiting exposure to the novel coronavirus.    Consent was obtained for video visit:  Yes.   Answered questions that patient had about telehealth interaction:  Yes.   I discussed the limitations, risks, security and privacy concerns of performing an evaluation and management service by telemedicine. I also discussed with the patient that there may be a patient responsible charge related to this service. The patient expressed understanding and agreed to proceed.  Pt location: Home Physician Location: office Name of referring provider:  [name], MD I connected with Katherine Novak at patients initiation/request on 12/15/2019 at 10:15 AM EST by video enabled telemedicine application and verified that I am speaking with the correct person using two identifiers. Pt MRN:  [id number] Pt DOB:  Sep 10, 1934 Video Participants:  Katherine Novak;  Son; medical interpreter   Assessment/Plan:   1.  Cerebral infarction  -Likely embolic.  There was small left insular and frontal white matter infarcts -concern is for atrial fibrillation-patient saw Dr. Erlinda Hong in hospital  -Patient will stay on Plavix and aspirin together until the end of the month and will then discontinue the Plavix and stay on aspirin alone.  -We discussed the diagnosis as well as pathophysiology of the disease.  We discussed signs and sx's of stroke and importance of calling 911 immediately should she have any of these.  Patient education was provided.    -Talked about stroke risk factors   -Talked about importance of blood pressure control with a goal <130/80 mm Hg.   -Talked about importance of lipid control and proper diet.  Lipids should be managed intensively, with a goal LDL < 70 mg/dL.  Her current LDL was 112.  Crestor increased from 5 mg to 10 mg  -Discussed recommendation of referring to cardiology  for evaluation of possible atrial fibrillation.  Would recommend 30-day cardiac event monitor and if negative, loop recorder insertion.  Son states that a 30-day monitor has actually already been sent to them.  I will refer to cardiology for further evaluation.   2.  Parkinsons Disease  -Continue carbidopa/levodopa 25/100, 2 tablets at 6 AM/2 tablet at 10 AM/2 tablets at 2 PM/2 tablet at 6 PM  -Continue carbidopa/levodopa 50/200 at bedtime  -home PT. 3. RBD  -Bedroom safety discussed. Wants no medication. Now has bedrails  -Son asked about pathophysiology and discussed that today.  4. Depression  -Continue mirtazapine, 15 mg nightly, which was just started about a week ago.  This has helped per son.   Subjective:   Katherine Novak was seen today in follow up for Parkinsons disease.  My previous records were reviewed prior to todays visit as well as outside records available to me.  Pt originally scheduled for in person visit but son called prior to visit to change to VV as unable to get patient to office.  Medical interpreter is present.  Patient was just in the office November 30.  Son called December 2 stating that the patient was having difficulty speaking.  It was advised that she go to the emergency room.  Son advised that they were not going to do that, but instead were going to call the primary care physician.  We suggested that they go to the emergency room, especially given the fact that neither primary care nor myself could assess speech change accurately, given the fact that patient  does not speak [place].  Ultimately patient was seen in the emergency room on December 2.  MRI brain was completed and patient was admitted to the hospital.  MRI brain demonstrated acute ischemic event in the left frontal white matter.  There was confluent hyperintense T2 hyper intensities in the left periventricular deep white matter.  There was chronic small vessel disease/white matter disease.  I personally  reviewed this.  MRA head and neck were normal.  Pt states today that she is having trouble speaking and trouble with slurred speech.  She is feeling weak and having trouble getting OOB.  She describes a generalized weakness (not localized).  Cannot walk with the walker due to weakness.  She feels restless when she sits.  Son does state that the mirtazapine has helped her sleep much better.   Current prescribed movement disorder medications: Carbidopa/levodopa 25/100, 2 tablets at 6 AM/1 tablet at 10 AM/2 tablets at 2 PM/2 tablet at 6 PM (just increased about a week ago) Carbidopa/levodopa 50/200 at bedtime  Mirtazapine, 15 mg daily (just started about a week ago). Plavix, 75 mg daily Aspirin, 81 mg daily    ALLERGIES:   Allergies  Allergen Reactions  . Sulfa Antibiotics Swelling    Angioedema of lips , throat    CURRENT MEDICATIONS:  Outpatient Encounter Medications as of 12/15/2019  Medication Sig  . acetaminophen (TYLENOL) 325 MG tablet Take 650 mg by mouth every 6 (six) hours as needed for mild pain.   Marland Kitchen alendronate (FOSAMAX) 70 MG tablet Take 70 mg by mouth every Wednesday.   Marland Kitchen aspirin EC 81 MG EC tablet Take 1 tablet (81 mg total) by mouth daily for 21 days. Swallow whole.  . carbidopa-levodopa (SINEMET CR) 50-200 MG tablet TAKE 1 TABLET BY MOUTH EVERYDAY AT BEDTIME (Patient taking differently: Take 1 tablet by mouth at bedtime. )  . carbidopa-levodopa (SINEMET IR) 25-100 MG tablet Take 2 tablets by mouth 4 (four) times daily. 2 tablets at 6 AM/2 tablet at 10 AM/2 tablets at 2 PM/2 tablet at 6 PM (Patient taking differently: Take 2 tablets by mouth See admin instructions. Take 2 tablets by mouth 4 times daily @ 6 AM; 10 AM;  2 PM; and 6 PM)  . clopidogrel (PLAVIX) 75 MG tablet Take 1 tablet (75 mg total) by mouth daily.  . furosemide (LASIX) 20 MG tablet Take 20 mg by mouth daily as needed for fluid.   . metoprolol tartrate (LOPRESSOR) 25 MG tablet Take 25 mg by mouth 2 (two) times  daily.  . mirtazapine (REMERON) 15 MG tablet Take 1 tablet (15 mg total) by mouth at bedtime.  Marland Kitchen omeprazole (PRILOSEC) 20 MG capsule Take 20 mg by mouth daily.  . rosuvastatin (CRESTOR) 10 MG tablet Take 1 tablet (10 mg total) by mouth daily.   No facility-administered encounter medications on file as of 12/15/2019.    Objective:   PHYSICAL EXAMINATION:    VITALS:   There were no vitals filed for this visit.  GEN:  The patient appears stated age and is in NAD.   Neurological examination:  Orientation: The patient is alert and oriented x3. Cranial nerves: There is good facial symmetry with facial hypomimia.  Patient does not speak my native language, so unable to assess fluency/clarity.  However, the interpreter states that she is generally pretty clear, but slow (she thinks that it is so the patient is not slurring).     I have reviewed and interpreted the following labs independently  Chemistry      Component Value Date/Time   NA 143 12/09/2019 1605   NA 143 02/01/2013 0911   K 3.8 12/09/2019 1605   K 4.0 02/01/2013 0911   CL 105 12/09/2019 1605   CO2 28 12/09/2019 1605   CO2 24 02/01/2013 0911   BUN 8 12/09/2019 1605   BUN 11.5 02/01/2013 0911   CREATININE 0.68 12/09/2019 1605   CREATININE 0.7 02/01/2013 0911      Component Value Date/Time   CALCIUM 9.5 12/09/2019 1605   CALCIUM 9.4 02/01/2013 0911   ALKPHOS 60 12/09/2019 1605   ALKPHOS 80 02/01/2013 0911   AST 13 (L) 12/09/2019 1605   AST 19 02/01/2013 0911   ALT <5 12/09/2019 1605   ALT 14 02/01/2013 0911   BILITOT 0.5 12/09/2019 1605   BILITOT 0.28 02/01/2013 0911       Lab Results  Component Value Date   WBC 5.4 12/09/2019   HGB 10.8 (L) 12/09/2019   HCT 36.7 12/09/2019   MCV 86.4 12/09/2019   PLT 168 12/09/2019    No results found for: TSH    Cc:  [name], MD

## 2019-12-15 ENCOUNTER — Telehealth: Payer: Self-pay | Admitting: *Deleted

## 2019-12-15 ENCOUNTER — Other Ambulatory Visit: Payer: Self-pay

## 2019-12-15 ENCOUNTER — Telehealth: Payer: Medicare Other | Admitting: Neurology

## 2019-12-15 DIAGNOSIS — G20 Parkinson's disease: Secondary | ICD-10-CM

## 2019-12-15 DIAGNOSIS — I639 Cerebral infarction, unspecified: Secondary | ICD-10-CM

## 2019-12-15 DIAGNOSIS — I63412 Cerebral infarction due to embolism of left middle cerebral artery: Secondary | ICD-10-CM

## 2019-12-15 NOTE — Telephone Encounter (Signed)
Patient enrolled for [employer] to ship a 30 day cardiac event monitor to her home.  Instructions sent via My Chart.  Mr. Caldwell given My Chart Help desk number to assist in accessing My Chart.

## 2019-12-19 ENCOUNTER — Ambulatory Visit (INDEPENDENT_AMBULATORY_CARE_PROVIDER_SITE_OTHER): Payer: Medicare Other

## 2019-12-19 DIAGNOSIS — I4891 Unspecified atrial fibrillation: Secondary | ICD-10-CM

## 2019-12-19 DIAGNOSIS — I639 Cerebral infarction, unspecified: Secondary | ICD-10-CM

## 2020-01-09 ENCOUNTER — Other Ambulatory Visit: Payer: Self-pay | Admitting: Neurology

## 2020-01-27 ENCOUNTER — Telehealth: Payer: Self-pay | Admitting: Neurology

## 2020-01-27 NOTE — Telephone Encounter (Signed)
Patient's son called and said he is returning the call from [facility] to go over heart monitor test results.

## 2020-01-28 NOTE — Telephone Encounter (Signed)
Spoke with son an advised.

## 2020-02-08 DIAGNOSIS — I639 Cerebral infarction, unspecified: Secondary | ICD-10-CM | POA: Diagnosis not present

## 2020-02-08 DIAGNOSIS — G20 Parkinson's disease: Secondary | ICD-10-CM | POA: Diagnosis not present

## 2020-02-13 DIAGNOSIS — R21 Rash and other nonspecific skin eruption: Secondary | ICD-10-CM | POA: Diagnosis not present

## 2020-03-02 DIAGNOSIS — I639 Cerebral infarction, unspecified: Secondary | ICD-10-CM | POA: Diagnosis not present

## 2020-03-02 DIAGNOSIS — G20 Parkinson's disease: Secondary | ICD-10-CM | POA: Diagnosis not present

## 2020-03-02 DIAGNOSIS — R5381 Other malaise: Secondary | ICD-10-CM | POA: Diagnosis not present

## 2020-03-02 DIAGNOSIS — K1379 Other lesions of oral mucosa: Secondary | ICD-10-CM | POA: Diagnosis not present

## 2020-03-09 DIAGNOSIS — G20 Parkinson's disease: Secondary | ICD-10-CM | POA: Diagnosis not present

## 2020-03-09 DIAGNOSIS — I639 Cerebral infarction, unspecified: Secondary | ICD-10-CM | POA: Diagnosis not present

## 2020-03-27 DIAGNOSIS — Z9181 History of falling: Secondary | ICD-10-CM | POA: Diagnosis not present

## 2020-03-27 DIAGNOSIS — H9190 Unspecified hearing loss, unspecified ear: Secondary | ICD-10-CM | POA: Diagnosis not present

## 2020-03-27 DIAGNOSIS — G20 Parkinson's disease: Secondary | ICD-10-CM | POA: Diagnosis not present

## 2020-03-27 DIAGNOSIS — E042 Nontoxic multinodular goiter: Secondary | ICD-10-CM | POA: Diagnosis not present

## 2020-03-27 DIAGNOSIS — Z96653 Presence of artificial knee joint, bilateral: Secondary | ICD-10-CM | POA: Diagnosis not present

## 2020-03-27 DIAGNOSIS — K2270 Barrett's esophagus without dysplasia: Secondary | ICD-10-CM | POA: Diagnosis not present

## 2020-03-27 DIAGNOSIS — Z856 Personal history of leukemia: Secondary | ICD-10-CM | POA: Diagnosis not present

## 2020-03-27 DIAGNOSIS — K219 Gastro-esophageal reflux disease without esophagitis: Secondary | ICD-10-CM | POA: Diagnosis not present

## 2020-03-27 DIAGNOSIS — Z7902 Long term (current) use of antithrombotics/antiplatelets: Secondary | ICD-10-CM | POA: Diagnosis not present

## 2020-03-27 DIAGNOSIS — I10 Essential (primary) hypertension: Secondary | ICD-10-CM | POA: Diagnosis not present

## 2020-03-27 DIAGNOSIS — K1379 Other lesions of oral mucosa: Secondary | ICD-10-CM | POA: Diagnosis not present

## 2020-03-27 DIAGNOSIS — Z8673 Personal history of transient ischemic attack (TIA), and cerebral infarction without residual deficits: Secondary | ICD-10-CM | POA: Diagnosis not present

## 2020-03-27 DIAGNOSIS — M810 Age-related osteoporosis without current pathological fracture: Secondary | ICD-10-CM | POA: Diagnosis not present

## 2020-03-27 DIAGNOSIS — I714 Abdominal aortic aneurysm, without rupture: Secondary | ICD-10-CM | POA: Diagnosis not present

## 2020-03-27 DIAGNOSIS — E7800 Pure hypercholesterolemia, unspecified: Secondary | ICD-10-CM | POA: Diagnosis not present

## 2020-03-29 DIAGNOSIS — Z856 Personal history of leukemia: Secondary | ICD-10-CM | POA: Diagnosis not present

## 2020-03-29 DIAGNOSIS — Z7902 Long term (current) use of antithrombotics/antiplatelets: Secondary | ICD-10-CM | POA: Diagnosis not present

## 2020-03-29 DIAGNOSIS — E042 Nontoxic multinodular goiter: Secondary | ICD-10-CM | POA: Diagnosis not present

## 2020-03-29 DIAGNOSIS — M810 Age-related osteoporosis without current pathological fracture: Secondary | ICD-10-CM | POA: Diagnosis not present

## 2020-03-29 DIAGNOSIS — Z96653 Presence of artificial knee joint, bilateral: Secondary | ICD-10-CM | POA: Diagnosis not present

## 2020-03-29 DIAGNOSIS — E7800 Pure hypercholesterolemia, unspecified: Secondary | ICD-10-CM | POA: Diagnosis not present

## 2020-03-29 DIAGNOSIS — I714 Abdominal aortic aneurysm, without rupture: Secondary | ICD-10-CM | POA: Diagnosis not present

## 2020-03-29 DIAGNOSIS — I10 Essential (primary) hypertension: Secondary | ICD-10-CM | POA: Diagnosis not present

## 2020-03-29 DIAGNOSIS — H9190 Unspecified hearing loss, unspecified ear: Secondary | ICD-10-CM | POA: Diagnosis not present

## 2020-03-29 DIAGNOSIS — G20 Parkinson's disease: Secondary | ICD-10-CM | POA: Diagnosis not present

## 2020-03-29 DIAGNOSIS — K2270 Barrett's esophagus without dysplasia: Secondary | ICD-10-CM | POA: Diagnosis not present

## 2020-03-29 DIAGNOSIS — Z9181 History of falling: Secondary | ICD-10-CM | POA: Diagnosis not present

## 2020-03-29 DIAGNOSIS — K1379 Other lesions of oral mucosa: Secondary | ICD-10-CM | POA: Diagnosis not present

## 2020-03-29 DIAGNOSIS — K219 Gastro-esophageal reflux disease without esophagitis: Secondary | ICD-10-CM | POA: Diagnosis not present

## 2020-03-29 DIAGNOSIS — Z8673 Personal history of transient ischemic attack (TIA), and cerebral infarction without residual deficits: Secondary | ICD-10-CM | POA: Diagnosis not present

## 2020-03-31 DIAGNOSIS — Z7902 Long term (current) use of antithrombotics/antiplatelets: Secondary | ICD-10-CM | POA: Diagnosis not present

## 2020-03-31 DIAGNOSIS — H9190 Unspecified hearing loss, unspecified ear: Secondary | ICD-10-CM | POA: Diagnosis not present

## 2020-03-31 DIAGNOSIS — G20 Parkinson's disease: Secondary | ICD-10-CM | POA: Diagnosis not present

## 2020-03-31 DIAGNOSIS — Z9181 History of falling: Secondary | ICD-10-CM | POA: Diagnosis not present

## 2020-03-31 DIAGNOSIS — E7800 Pure hypercholesterolemia, unspecified: Secondary | ICD-10-CM | POA: Diagnosis not present

## 2020-03-31 DIAGNOSIS — I10 Essential (primary) hypertension: Secondary | ICD-10-CM | POA: Diagnosis not present

## 2020-03-31 DIAGNOSIS — Z96653 Presence of artificial knee joint, bilateral: Secondary | ICD-10-CM | POA: Diagnosis not present

## 2020-03-31 DIAGNOSIS — Z856 Personal history of leukemia: Secondary | ICD-10-CM | POA: Diagnosis not present

## 2020-03-31 DIAGNOSIS — Z8673 Personal history of transient ischemic attack (TIA), and cerebral infarction without residual deficits: Secondary | ICD-10-CM | POA: Diagnosis not present

## 2020-03-31 DIAGNOSIS — E042 Nontoxic multinodular goiter: Secondary | ICD-10-CM | POA: Diagnosis not present

## 2020-03-31 DIAGNOSIS — M810 Age-related osteoporosis without current pathological fracture: Secondary | ICD-10-CM | POA: Diagnosis not present

## 2020-03-31 DIAGNOSIS — K219 Gastro-esophageal reflux disease without esophagitis: Secondary | ICD-10-CM | POA: Diagnosis not present

## 2020-03-31 DIAGNOSIS — K2270 Barrett's esophagus without dysplasia: Secondary | ICD-10-CM | POA: Diagnosis not present

## 2020-03-31 DIAGNOSIS — K1379 Other lesions of oral mucosa: Secondary | ICD-10-CM | POA: Diagnosis not present

## 2020-03-31 DIAGNOSIS — I714 Abdominal aortic aneurysm, without rupture: Secondary | ICD-10-CM | POA: Diagnosis not present

## 2020-04-03 ENCOUNTER — Other Ambulatory Visit: Payer: Self-pay | Admitting: Neurology

## 2020-04-03 DIAGNOSIS — I10 Essential (primary) hypertension: Secondary | ICD-10-CM | POA: Diagnosis not present

## 2020-04-03 DIAGNOSIS — K219 Gastro-esophageal reflux disease without esophagitis: Secondary | ICD-10-CM | POA: Diagnosis not present

## 2020-04-03 DIAGNOSIS — Z856 Personal history of leukemia: Secondary | ICD-10-CM | POA: Diagnosis not present

## 2020-04-03 DIAGNOSIS — K1379 Other lesions of oral mucosa: Secondary | ICD-10-CM | POA: Diagnosis not present

## 2020-04-03 DIAGNOSIS — G20 Parkinson's disease: Secondary | ICD-10-CM | POA: Diagnosis not present

## 2020-04-03 DIAGNOSIS — Z9181 History of falling: Secondary | ICD-10-CM | POA: Diagnosis not present

## 2020-04-03 DIAGNOSIS — E7800 Pure hypercholesterolemia, unspecified: Secondary | ICD-10-CM | POA: Diagnosis not present

## 2020-04-03 DIAGNOSIS — I714 Abdominal aortic aneurysm, without rupture: Secondary | ICD-10-CM | POA: Diagnosis not present

## 2020-04-03 DIAGNOSIS — M810 Age-related osteoporosis without current pathological fracture: Secondary | ICD-10-CM | POA: Diagnosis not present

## 2020-04-03 DIAGNOSIS — K2270 Barrett's esophagus without dysplasia: Secondary | ICD-10-CM | POA: Diagnosis not present

## 2020-04-03 DIAGNOSIS — Z8673 Personal history of transient ischemic attack (TIA), and cerebral infarction without residual deficits: Secondary | ICD-10-CM | POA: Diagnosis not present

## 2020-04-03 DIAGNOSIS — H9190 Unspecified hearing loss, unspecified ear: Secondary | ICD-10-CM | POA: Diagnosis not present

## 2020-04-03 DIAGNOSIS — E042 Nontoxic multinodular goiter: Secondary | ICD-10-CM | POA: Diagnosis not present

## 2020-04-03 DIAGNOSIS — Z7902 Long term (current) use of antithrombotics/antiplatelets: Secondary | ICD-10-CM | POA: Diagnosis not present

## 2020-04-03 DIAGNOSIS — Z96653 Presence of artificial knee joint, bilateral: Secondary | ICD-10-CM | POA: Diagnosis not present

## 2020-04-05 DIAGNOSIS — I714 Abdominal aortic aneurysm, without rupture: Secondary | ICD-10-CM | POA: Diagnosis not present

## 2020-04-05 DIAGNOSIS — Z856 Personal history of leukemia: Secondary | ICD-10-CM | POA: Diagnosis not present

## 2020-04-05 DIAGNOSIS — K2270 Barrett's esophagus without dysplasia: Secondary | ICD-10-CM | POA: Diagnosis not present

## 2020-04-05 DIAGNOSIS — H9190 Unspecified hearing loss, unspecified ear: Secondary | ICD-10-CM | POA: Diagnosis not present

## 2020-04-05 DIAGNOSIS — E042 Nontoxic multinodular goiter: Secondary | ICD-10-CM | POA: Diagnosis not present

## 2020-04-05 DIAGNOSIS — E7800 Pure hypercholesterolemia, unspecified: Secondary | ICD-10-CM | POA: Diagnosis not present

## 2020-04-05 DIAGNOSIS — G20 Parkinson's disease: Secondary | ICD-10-CM | POA: Diagnosis not present

## 2020-04-05 DIAGNOSIS — M810 Age-related osteoporosis without current pathological fracture: Secondary | ICD-10-CM | POA: Diagnosis not present

## 2020-04-05 DIAGNOSIS — I10 Essential (primary) hypertension: Secondary | ICD-10-CM | POA: Diagnosis not present

## 2020-04-05 DIAGNOSIS — Z7902 Long term (current) use of antithrombotics/antiplatelets: Secondary | ICD-10-CM | POA: Diagnosis not present

## 2020-04-05 DIAGNOSIS — Z9181 History of falling: Secondary | ICD-10-CM | POA: Diagnosis not present

## 2020-04-05 DIAGNOSIS — K1379 Other lesions of oral mucosa: Secondary | ICD-10-CM | POA: Diagnosis not present

## 2020-04-05 DIAGNOSIS — Z8673 Personal history of transient ischemic attack (TIA), and cerebral infarction without residual deficits: Secondary | ICD-10-CM | POA: Diagnosis not present

## 2020-04-05 DIAGNOSIS — K219 Gastro-esophageal reflux disease without esophagitis: Secondary | ICD-10-CM | POA: Diagnosis not present

## 2020-04-05 DIAGNOSIS — Z96653 Presence of artificial knee joint, bilateral: Secondary | ICD-10-CM | POA: Diagnosis not present

## 2020-04-06 DIAGNOSIS — M810 Age-related osteoporosis without current pathological fracture: Secondary | ICD-10-CM | POA: Diagnosis not present

## 2020-04-06 DIAGNOSIS — Z96653 Presence of artificial knee joint, bilateral: Secondary | ICD-10-CM | POA: Diagnosis not present

## 2020-04-06 DIAGNOSIS — Z856 Personal history of leukemia: Secondary | ICD-10-CM | POA: Diagnosis not present

## 2020-04-06 DIAGNOSIS — K1379 Other lesions of oral mucosa: Secondary | ICD-10-CM | POA: Diagnosis not present

## 2020-04-06 DIAGNOSIS — G20 Parkinson's disease: Secondary | ICD-10-CM | POA: Diagnosis not present

## 2020-04-06 DIAGNOSIS — H9190 Unspecified hearing loss, unspecified ear: Secondary | ICD-10-CM | POA: Diagnosis not present

## 2020-04-06 DIAGNOSIS — Z7902 Long term (current) use of antithrombotics/antiplatelets: Secondary | ICD-10-CM | POA: Diagnosis not present

## 2020-04-06 DIAGNOSIS — E042 Nontoxic multinodular goiter: Secondary | ICD-10-CM | POA: Diagnosis not present

## 2020-04-06 DIAGNOSIS — Z9181 History of falling: Secondary | ICD-10-CM | POA: Diagnosis not present

## 2020-04-06 DIAGNOSIS — K219 Gastro-esophageal reflux disease without esophagitis: Secondary | ICD-10-CM | POA: Diagnosis not present

## 2020-04-06 DIAGNOSIS — E7800 Pure hypercholesterolemia, unspecified: Secondary | ICD-10-CM | POA: Diagnosis not present

## 2020-04-06 DIAGNOSIS — K2270 Barrett's esophagus without dysplasia: Secondary | ICD-10-CM | POA: Diagnosis not present

## 2020-04-06 DIAGNOSIS — Z8673 Personal history of transient ischemic attack (TIA), and cerebral infarction without residual deficits: Secondary | ICD-10-CM | POA: Diagnosis not present

## 2020-04-06 DIAGNOSIS — I10 Essential (primary) hypertension: Secondary | ICD-10-CM | POA: Diagnosis not present

## 2020-04-06 DIAGNOSIS — I714 Abdominal aortic aneurysm, without rupture: Secondary | ICD-10-CM | POA: Diagnosis not present

## 2020-04-09 DIAGNOSIS — G20 Parkinson's disease: Secondary | ICD-10-CM | POA: Diagnosis not present

## 2020-04-09 DIAGNOSIS — I639 Cerebral infarction, unspecified: Secondary | ICD-10-CM | POA: Diagnosis not present

## 2020-04-10 DIAGNOSIS — G20 Parkinson's disease: Secondary | ICD-10-CM | POA: Diagnosis not present

## 2020-04-10 DIAGNOSIS — Z856 Personal history of leukemia: Secondary | ICD-10-CM | POA: Diagnosis not present

## 2020-04-10 DIAGNOSIS — I714 Abdominal aortic aneurysm, without rupture: Secondary | ICD-10-CM | POA: Diagnosis not present

## 2020-04-10 DIAGNOSIS — K219 Gastro-esophageal reflux disease without esophagitis: Secondary | ICD-10-CM | POA: Diagnosis not present

## 2020-04-10 DIAGNOSIS — M810 Age-related osteoporosis without current pathological fracture: Secondary | ICD-10-CM | POA: Diagnosis not present

## 2020-04-10 DIAGNOSIS — E7800 Pure hypercholesterolemia, unspecified: Secondary | ICD-10-CM | POA: Diagnosis not present

## 2020-04-10 DIAGNOSIS — K1379 Other lesions of oral mucosa: Secondary | ICD-10-CM | POA: Diagnosis not present

## 2020-04-10 DIAGNOSIS — H9190 Unspecified hearing loss, unspecified ear: Secondary | ICD-10-CM | POA: Diagnosis not present

## 2020-04-10 DIAGNOSIS — E042 Nontoxic multinodular goiter: Secondary | ICD-10-CM | POA: Diagnosis not present

## 2020-04-10 DIAGNOSIS — I10 Essential (primary) hypertension: Secondary | ICD-10-CM | POA: Diagnosis not present

## 2020-04-10 DIAGNOSIS — Z7902 Long term (current) use of antithrombotics/antiplatelets: Secondary | ICD-10-CM | POA: Diagnosis not present

## 2020-04-10 DIAGNOSIS — Z9181 History of falling: Secondary | ICD-10-CM | POA: Diagnosis not present

## 2020-04-10 DIAGNOSIS — K2270 Barrett's esophagus without dysplasia: Secondary | ICD-10-CM | POA: Diagnosis not present

## 2020-04-10 DIAGNOSIS — Z96653 Presence of artificial knee joint, bilateral: Secondary | ICD-10-CM | POA: Diagnosis not present

## 2020-04-10 DIAGNOSIS — Z8673 Personal history of transient ischemic attack (TIA), and cerebral infarction without residual deficits: Secondary | ICD-10-CM | POA: Diagnosis not present

## 2020-04-11 DIAGNOSIS — I10 Essential (primary) hypertension: Secondary | ICD-10-CM | POA: Diagnosis not present

## 2020-04-11 DIAGNOSIS — Z96653 Presence of artificial knee joint, bilateral: Secondary | ICD-10-CM | POA: Diagnosis not present

## 2020-04-11 DIAGNOSIS — Z7902 Long term (current) use of antithrombotics/antiplatelets: Secondary | ICD-10-CM | POA: Diagnosis not present

## 2020-04-11 DIAGNOSIS — Z9181 History of falling: Secondary | ICD-10-CM | POA: Diagnosis not present

## 2020-04-11 DIAGNOSIS — M810 Age-related osteoporosis without current pathological fracture: Secondary | ICD-10-CM | POA: Diagnosis not present

## 2020-04-11 DIAGNOSIS — E7800 Pure hypercholesterolemia, unspecified: Secondary | ICD-10-CM | POA: Diagnosis not present

## 2020-04-11 DIAGNOSIS — G20 Parkinson's disease: Secondary | ICD-10-CM | POA: Diagnosis not present

## 2020-04-11 DIAGNOSIS — K1379 Other lesions of oral mucosa: Secondary | ICD-10-CM | POA: Diagnosis not present

## 2020-04-11 DIAGNOSIS — Z856 Personal history of leukemia: Secondary | ICD-10-CM | POA: Diagnosis not present

## 2020-04-11 DIAGNOSIS — I714 Abdominal aortic aneurysm, without rupture: Secondary | ICD-10-CM | POA: Diagnosis not present

## 2020-04-11 DIAGNOSIS — Z8673 Personal history of transient ischemic attack (TIA), and cerebral infarction without residual deficits: Secondary | ICD-10-CM | POA: Diagnosis not present

## 2020-04-11 DIAGNOSIS — E042 Nontoxic multinodular goiter: Secondary | ICD-10-CM | POA: Diagnosis not present

## 2020-04-11 DIAGNOSIS — K219 Gastro-esophageal reflux disease without esophagitis: Secondary | ICD-10-CM | POA: Diagnosis not present

## 2020-04-11 DIAGNOSIS — H9190 Unspecified hearing loss, unspecified ear: Secondary | ICD-10-CM | POA: Diagnosis not present

## 2020-04-11 DIAGNOSIS — K2270 Barrett's esophagus without dysplasia: Secondary | ICD-10-CM | POA: Diagnosis not present

## 2020-04-14 DIAGNOSIS — Z96653 Presence of artificial knee joint, bilateral: Secondary | ICD-10-CM | POA: Diagnosis not present

## 2020-04-14 DIAGNOSIS — I10 Essential (primary) hypertension: Secondary | ICD-10-CM | POA: Diagnosis not present

## 2020-04-14 DIAGNOSIS — G20 Parkinson's disease: Secondary | ICD-10-CM | POA: Diagnosis not present

## 2020-04-14 DIAGNOSIS — E7800 Pure hypercholesterolemia, unspecified: Secondary | ICD-10-CM | POA: Diagnosis not present

## 2020-04-14 DIAGNOSIS — H9190 Unspecified hearing loss, unspecified ear: Secondary | ICD-10-CM | POA: Diagnosis not present

## 2020-04-14 DIAGNOSIS — I714 Abdominal aortic aneurysm, without rupture: Secondary | ICD-10-CM | POA: Diagnosis not present

## 2020-04-14 DIAGNOSIS — K1379 Other lesions of oral mucosa: Secondary | ICD-10-CM | POA: Diagnosis not present

## 2020-04-14 DIAGNOSIS — K219 Gastro-esophageal reflux disease without esophagitis: Secondary | ICD-10-CM | POA: Diagnosis not present

## 2020-04-14 DIAGNOSIS — Z856 Personal history of leukemia: Secondary | ICD-10-CM | POA: Diagnosis not present

## 2020-04-14 DIAGNOSIS — M810 Age-related osteoporosis without current pathological fracture: Secondary | ICD-10-CM | POA: Diagnosis not present

## 2020-04-14 DIAGNOSIS — Z9181 History of falling: Secondary | ICD-10-CM | POA: Diagnosis not present

## 2020-04-14 DIAGNOSIS — K2270 Barrett's esophagus without dysplasia: Secondary | ICD-10-CM | POA: Diagnosis not present

## 2020-04-14 DIAGNOSIS — E042 Nontoxic multinodular goiter: Secondary | ICD-10-CM | POA: Diagnosis not present

## 2020-04-14 DIAGNOSIS — Z7902 Long term (current) use of antithrombotics/antiplatelets: Secondary | ICD-10-CM | POA: Diagnosis not present

## 2020-04-14 DIAGNOSIS — Z8673 Personal history of transient ischemic attack (TIA), and cerebral infarction without residual deficits: Secondary | ICD-10-CM | POA: Diagnosis not present

## 2020-04-17 DIAGNOSIS — Z96653 Presence of artificial knee joint, bilateral: Secondary | ICD-10-CM | POA: Diagnosis not present

## 2020-04-17 DIAGNOSIS — K1379 Other lesions of oral mucosa: Secondary | ICD-10-CM | POA: Diagnosis not present

## 2020-04-17 DIAGNOSIS — K219 Gastro-esophageal reflux disease without esophagitis: Secondary | ICD-10-CM | POA: Diagnosis not present

## 2020-04-17 DIAGNOSIS — Z7902 Long term (current) use of antithrombotics/antiplatelets: Secondary | ICD-10-CM | POA: Diagnosis not present

## 2020-04-17 DIAGNOSIS — M810 Age-related osteoporosis without current pathological fracture: Secondary | ICD-10-CM | POA: Diagnosis not present

## 2020-04-17 DIAGNOSIS — Z9181 History of falling: Secondary | ICD-10-CM | POA: Diagnosis not present

## 2020-04-17 DIAGNOSIS — I714 Abdominal aortic aneurysm, without rupture: Secondary | ICD-10-CM | POA: Diagnosis not present

## 2020-04-17 DIAGNOSIS — H9190 Unspecified hearing loss, unspecified ear: Secondary | ICD-10-CM | POA: Diagnosis not present

## 2020-04-17 DIAGNOSIS — K2270 Barrett's esophagus without dysplasia: Secondary | ICD-10-CM | POA: Diagnosis not present

## 2020-04-17 DIAGNOSIS — Z8673 Personal history of transient ischemic attack (TIA), and cerebral infarction without residual deficits: Secondary | ICD-10-CM | POA: Diagnosis not present

## 2020-04-17 DIAGNOSIS — Z856 Personal history of leukemia: Secondary | ICD-10-CM | POA: Diagnosis not present

## 2020-04-17 DIAGNOSIS — E7800 Pure hypercholesterolemia, unspecified: Secondary | ICD-10-CM | POA: Diagnosis not present

## 2020-04-17 DIAGNOSIS — G20 Parkinson's disease: Secondary | ICD-10-CM | POA: Diagnosis not present

## 2020-04-17 DIAGNOSIS — I10 Essential (primary) hypertension: Secondary | ICD-10-CM | POA: Diagnosis not present

## 2020-04-17 DIAGNOSIS — E042 Nontoxic multinodular goiter: Secondary | ICD-10-CM | POA: Diagnosis not present

## 2020-04-19 DIAGNOSIS — I714 Abdominal aortic aneurysm, without rupture: Secondary | ICD-10-CM | POA: Diagnosis not present

## 2020-04-19 DIAGNOSIS — Z8673 Personal history of transient ischemic attack (TIA), and cerebral infarction without residual deficits: Secondary | ICD-10-CM | POA: Diagnosis not present

## 2020-04-19 DIAGNOSIS — M810 Age-related osteoporosis without current pathological fracture: Secondary | ICD-10-CM | POA: Diagnosis not present

## 2020-04-19 DIAGNOSIS — G20 Parkinson's disease: Secondary | ICD-10-CM | POA: Diagnosis not present

## 2020-04-19 DIAGNOSIS — E7800 Pure hypercholesterolemia, unspecified: Secondary | ICD-10-CM | POA: Diagnosis not present

## 2020-04-19 DIAGNOSIS — Z856 Personal history of leukemia: Secondary | ICD-10-CM | POA: Diagnosis not present

## 2020-04-19 DIAGNOSIS — Z7902 Long term (current) use of antithrombotics/antiplatelets: Secondary | ICD-10-CM | POA: Diagnosis not present

## 2020-04-19 DIAGNOSIS — K1379 Other lesions of oral mucosa: Secondary | ICD-10-CM | POA: Diagnosis not present

## 2020-04-19 DIAGNOSIS — E042 Nontoxic multinodular goiter: Secondary | ICD-10-CM | POA: Diagnosis not present

## 2020-04-19 DIAGNOSIS — Z96653 Presence of artificial knee joint, bilateral: Secondary | ICD-10-CM | POA: Diagnosis not present

## 2020-04-19 DIAGNOSIS — K2270 Barrett's esophagus without dysplasia: Secondary | ICD-10-CM | POA: Diagnosis not present

## 2020-04-19 DIAGNOSIS — I10 Essential (primary) hypertension: Secondary | ICD-10-CM | POA: Diagnosis not present

## 2020-04-19 DIAGNOSIS — H9190 Unspecified hearing loss, unspecified ear: Secondary | ICD-10-CM | POA: Diagnosis not present

## 2020-04-19 DIAGNOSIS — Z9181 History of falling: Secondary | ICD-10-CM | POA: Diagnosis not present

## 2020-04-19 DIAGNOSIS — K219 Gastro-esophageal reflux disease without esophagitis: Secondary | ICD-10-CM | POA: Diagnosis not present

## 2020-04-24 ENCOUNTER — Other Ambulatory Visit: Payer: Self-pay | Admitting: Neurology

## 2020-04-24 DIAGNOSIS — Z9181 History of falling: Secondary | ICD-10-CM | POA: Diagnosis not present

## 2020-04-24 DIAGNOSIS — Z7902 Long term (current) use of antithrombotics/antiplatelets: Secondary | ICD-10-CM | POA: Diagnosis not present

## 2020-04-24 DIAGNOSIS — G20 Parkinson's disease: Secondary | ICD-10-CM | POA: Diagnosis not present

## 2020-04-24 DIAGNOSIS — I10 Essential (primary) hypertension: Secondary | ICD-10-CM | POA: Diagnosis not present

## 2020-04-24 DIAGNOSIS — E042 Nontoxic multinodular goiter: Secondary | ICD-10-CM | POA: Diagnosis not present

## 2020-04-24 DIAGNOSIS — E7800 Pure hypercholesterolemia, unspecified: Secondary | ICD-10-CM | POA: Diagnosis not present

## 2020-04-24 DIAGNOSIS — H9190 Unspecified hearing loss, unspecified ear: Secondary | ICD-10-CM | POA: Diagnosis not present

## 2020-04-24 DIAGNOSIS — Z8673 Personal history of transient ischemic attack (TIA), and cerebral infarction without residual deficits: Secondary | ICD-10-CM | POA: Diagnosis not present

## 2020-04-24 DIAGNOSIS — Z96653 Presence of artificial knee joint, bilateral: Secondary | ICD-10-CM | POA: Diagnosis not present

## 2020-04-24 DIAGNOSIS — Z856 Personal history of leukemia: Secondary | ICD-10-CM | POA: Diagnosis not present

## 2020-04-24 DIAGNOSIS — K1379 Other lesions of oral mucosa: Secondary | ICD-10-CM | POA: Diagnosis not present

## 2020-04-24 DIAGNOSIS — M810 Age-related osteoporosis without current pathological fracture: Secondary | ICD-10-CM | POA: Diagnosis not present

## 2020-04-24 DIAGNOSIS — K2270 Barrett's esophagus without dysplasia: Secondary | ICD-10-CM | POA: Diagnosis not present

## 2020-04-24 DIAGNOSIS — K219 Gastro-esophageal reflux disease without esophagitis: Secondary | ICD-10-CM | POA: Diagnosis not present

## 2020-04-24 DIAGNOSIS — I714 Abdominal aortic aneurysm, without rupture: Secondary | ICD-10-CM | POA: Diagnosis not present

## 2020-04-25 DIAGNOSIS — Z7902 Long term (current) use of antithrombotics/antiplatelets: Secondary | ICD-10-CM | POA: Diagnosis not present

## 2020-04-25 DIAGNOSIS — Z9181 History of falling: Secondary | ICD-10-CM | POA: Diagnosis not present

## 2020-04-25 DIAGNOSIS — G20 Parkinson's disease: Secondary | ICD-10-CM | POA: Diagnosis not present

## 2020-04-25 DIAGNOSIS — H9190 Unspecified hearing loss, unspecified ear: Secondary | ICD-10-CM | POA: Diagnosis not present

## 2020-04-25 DIAGNOSIS — K219 Gastro-esophageal reflux disease without esophagitis: Secondary | ICD-10-CM | POA: Diagnosis not present

## 2020-04-25 DIAGNOSIS — Z96653 Presence of artificial knee joint, bilateral: Secondary | ICD-10-CM | POA: Diagnosis not present

## 2020-04-25 DIAGNOSIS — M810 Age-related osteoporosis without current pathological fracture: Secondary | ICD-10-CM | POA: Diagnosis not present

## 2020-04-25 DIAGNOSIS — E042 Nontoxic multinodular goiter: Secondary | ICD-10-CM | POA: Diagnosis not present

## 2020-04-25 DIAGNOSIS — K1379 Other lesions of oral mucosa: Secondary | ICD-10-CM | POA: Diagnosis not present

## 2020-04-25 DIAGNOSIS — K2270 Barrett's esophagus without dysplasia: Secondary | ICD-10-CM | POA: Diagnosis not present

## 2020-04-25 DIAGNOSIS — I714 Abdominal aortic aneurysm, without rupture: Secondary | ICD-10-CM | POA: Diagnosis not present

## 2020-04-25 DIAGNOSIS — Z8673 Personal history of transient ischemic attack (TIA), and cerebral infarction without residual deficits: Secondary | ICD-10-CM | POA: Diagnosis not present

## 2020-04-25 DIAGNOSIS — E7800 Pure hypercholesterolemia, unspecified: Secondary | ICD-10-CM | POA: Diagnosis not present

## 2020-04-25 DIAGNOSIS — Z856 Personal history of leukemia: Secondary | ICD-10-CM | POA: Diagnosis not present

## 2020-04-25 DIAGNOSIS — I10 Essential (primary) hypertension: Secondary | ICD-10-CM | POA: Diagnosis not present

## 2020-05-01 DIAGNOSIS — E7800 Pure hypercholesterolemia, unspecified: Secondary | ICD-10-CM | POA: Diagnosis not present

## 2020-05-01 DIAGNOSIS — Z7902 Long term (current) use of antithrombotics/antiplatelets: Secondary | ICD-10-CM | POA: Diagnosis not present

## 2020-05-01 DIAGNOSIS — K2270 Barrett's esophagus without dysplasia: Secondary | ICD-10-CM | POA: Diagnosis not present

## 2020-05-01 DIAGNOSIS — K1379 Other lesions of oral mucosa: Secondary | ICD-10-CM | POA: Diagnosis not present

## 2020-05-01 DIAGNOSIS — E042 Nontoxic multinodular goiter: Secondary | ICD-10-CM | POA: Diagnosis not present

## 2020-05-01 DIAGNOSIS — Z9181 History of falling: Secondary | ICD-10-CM | POA: Diagnosis not present

## 2020-05-01 DIAGNOSIS — I10 Essential (primary) hypertension: Secondary | ICD-10-CM | POA: Diagnosis not present

## 2020-05-01 DIAGNOSIS — Z8673 Personal history of transient ischemic attack (TIA), and cerebral infarction without residual deficits: Secondary | ICD-10-CM | POA: Diagnosis not present

## 2020-05-01 DIAGNOSIS — K219 Gastro-esophageal reflux disease without esophagitis: Secondary | ICD-10-CM | POA: Diagnosis not present

## 2020-05-01 DIAGNOSIS — Z856 Personal history of leukemia: Secondary | ICD-10-CM | POA: Diagnosis not present

## 2020-05-01 DIAGNOSIS — H9190 Unspecified hearing loss, unspecified ear: Secondary | ICD-10-CM | POA: Diagnosis not present

## 2020-05-01 DIAGNOSIS — Z96653 Presence of artificial knee joint, bilateral: Secondary | ICD-10-CM | POA: Diagnosis not present

## 2020-05-01 DIAGNOSIS — I714 Abdominal aortic aneurysm, without rupture: Secondary | ICD-10-CM | POA: Diagnosis not present

## 2020-05-01 DIAGNOSIS — G20 Parkinson's disease: Secondary | ICD-10-CM | POA: Diagnosis not present

## 2020-05-01 DIAGNOSIS — M810 Age-related osteoporosis without current pathological fracture: Secondary | ICD-10-CM | POA: Diagnosis not present

## 2020-05-02 DIAGNOSIS — K219 Gastro-esophageal reflux disease without esophagitis: Secondary | ICD-10-CM | POA: Diagnosis not present

## 2020-05-02 DIAGNOSIS — Z9181 History of falling: Secondary | ICD-10-CM | POA: Diagnosis not present

## 2020-05-02 DIAGNOSIS — Z7902 Long term (current) use of antithrombotics/antiplatelets: Secondary | ICD-10-CM | POA: Diagnosis not present

## 2020-05-02 DIAGNOSIS — K1379 Other lesions of oral mucosa: Secondary | ICD-10-CM | POA: Diagnosis not present

## 2020-05-02 DIAGNOSIS — E042 Nontoxic multinodular goiter: Secondary | ICD-10-CM | POA: Diagnosis not present

## 2020-05-02 DIAGNOSIS — H9190 Unspecified hearing loss, unspecified ear: Secondary | ICD-10-CM | POA: Diagnosis not present

## 2020-05-02 DIAGNOSIS — E7800 Pure hypercholesterolemia, unspecified: Secondary | ICD-10-CM | POA: Diagnosis not present

## 2020-05-02 DIAGNOSIS — K2270 Barrett's esophagus without dysplasia: Secondary | ICD-10-CM | POA: Diagnosis not present

## 2020-05-02 DIAGNOSIS — I10 Essential (primary) hypertension: Secondary | ICD-10-CM | POA: Diagnosis not present

## 2020-05-02 DIAGNOSIS — Z8673 Personal history of transient ischemic attack (TIA), and cerebral infarction without residual deficits: Secondary | ICD-10-CM | POA: Diagnosis not present

## 2020-05-02 DIAGNOSIS — G20 Parkinson's disease: Secondary | ICD-10-CM | POA: Diagnosis not present

## 2020-05-02 DIAGNOSIS — I714 Abdominal aortic aneurysm, without rupture: Secondary | ICD-10-CM | POA: Diagnosis not present

## 2020-05-02 DIAGNOSIS — Z856 Personal history of leukemia: Secondary | ICD-10-CM | POA: Diagnosis not present

## 2020-05-02 DIAGNOSIS — M810 Age-related osteoporosis without current pathological fracture: Secondary | ICD-10-CM | POA: Diagnosis not present

## 2020-05-02 DIAGNOSIS — Z96653 Presence of artificial knee joint, bilateral: Secondary | ICD-10-CM | POA: Diagnosis not present

## 2020-05-08 DIAGNOSIS — Z856 Personal history of leukemia: Secondary | ICD-10-CM | POA: Diagnosis not present

## 2020-05-08 DIAGNOSIS — E7800 Pure hypercholesterolemia, unspecified: Secondary | ICD-10-CM | POA: Diagnosis not present

## 2020-05-08 DIAGNOSIS — K2270 Barrett's esophagus without dysplasia: Secondary | ICD-10-CM | POA: Diagnosis not present

## 2020-05-08 DIAGNOSIS — Z96653 Presence of artificial knee joint, bilateral: Secondary | ICD-10-CM | POA: Diagnosis not present

## 2020-05-08 DIAGNOSIS — Z8673 Personal history of transient ischemic attack (TIA), and cerebral infarction without residual deficits: Secondary | ICD-10-CM | POA: Diagnosis not present

## 2020-05-08 DIAGNOSIS — E042 Nontoxic multinodular goiter: Secondary | ICD-10-CM | POA: Diagnosis not present

## 2020-05-08 DIAGNOSIS — I10 Essential (primary) hypertension: Secondary | ICD-10-CM | POA: Diagnosis not present

## 2020-05-08 DIAGNOSIS — Z7902 Long term (current) use of antithrombotics/antiplatelets: Secondary | ICD-10-CM | POA: Diagnosis not present

## 2020-05-08 DIAGNOSIS — K1379 Other lesions of oral mucosa: Secondary | ICD-10-CM | POA: Diagnosis not present

## 2020-05-08 DIAGNOSIS — Z9181 History of falling: Secondary | ICD-10-CM | POA: Diagnosis not present

## 2020-05-08 DIAGNOSIS — I714 Abdominal aortic aneurysm, without rupture: Secondary | ICD-10-CM | POA: Diagnosis not present

## 2020-05-08 DIAGNOSIS — H9190 Unspecified hearing loss, unspecified ear: Secondary | ICD-10-CM | POA: Diagnosis not present

## 2020-05-08 DIAGNOSIS — M810 Age-related osteoporosis without current pathological fracture: Secondary | ICD-10-CM | POA: Diagnosis not present

## 2020-05-08 DIAGNOSIS — G20 Parkinson's disease: Secondary | ICD-10-CM | POA: Diagnosis not present

## 2020-05-08 DIAGNOSIS — K219 Gastro-esophageal reflux disease without esophagitis: Secondary | ICD-10-CM | POA: Diagnosis not present

## 2020-05-09 DIAGNOSIS — G20 Parkinson's disease: Secondary | ICD-10-CM | POA: Diagnosis not present

## 2020-05-09 DIAGNOSIS — I639 Cerebral infarction, unspecified: Secondary | ICD-10-CM | POA: Diagnosis not present

## 2020-05-10 DIAGNOSIS — G20 Parkinson's disease: Secondary | ICD-10-CM | POA: Diagnosis not present

## 2020-05-10 DIAGNOSIS — E7800 Pure hypercholesterolemia, unspecified: Secondary | ICD-10-CM | POA: Diagnosis not present

## 2020-05-10 DIAGNOSIS — Z9181 History of falling: Secondary | ICD-10-CM | POA: Diagnosis not present

## 2020-05-10 DIAGNOSIS — K219 Gastro-esophageal reflux disease without esophagitis: Secondary | ICD-10-CM | POA: Diagnosis not present

## 2020-05-10 DIAGNOSIS — Z856 Personal history of leukemia: Secondary | ICD-10-CM | POA: Diagnosis not present

## 2020-05-10 DIAGNOSIS — K2270 Barrett's esophagus without dysplasia: Secondary | ICD-10-CM | POA: Diagnosis not present

## 2020-05-10 DIAGNOSIS — Z96653 Presence of artificial knee joint, bilateral: Secondary | ICD-10-CM | POA: Diagnosis not present

## 2020-05-10 DIAGNOSIS — Z8673 Personal history of transient ischemic attack (TIA), and cerebral infarction without residual deficits: Secondary | ICD-10-CM | POA: Diagnosis not present

## 2020-05-10 DIAGNOSIS — E042 Nontoxic multinodular goiter: Secondary | ICD-10-CM | POA: Diagnosis not present

## 2020-05-10 DIAGNOSIS — M810 Age-related osteoporosis without current pathological fracture: Secondary | ICD-10-CM | POA: Diagnosis not present

## 2020-05-10 DIAGNOSIS — H9190 Unspecified hearing loss, unspecified ear: Secondary | ICD-10-CM | POA: Diagnosis not present

## 2020-05-10 DIAGNOSIS — K1379 Other lesions of oral mucosa: Secondary | ICD-10-CM | POA: Diagnosis not present

## 2020-05-10 DIAGNOSIS — Z7902 Long term (current) use of antithrombotics/antiplatelets: Secondary | ICD-10-CM | POA: Diagnosis not present

## 2020-05-10 DIAGNOSIS — I714 Abdominal aortic aneurysm, without rupture: Secondary | ICD-10-CM | POA: Diagnosis not present

## 2020-05-10 DIAGNOSIS — I10 Essential (primary) hypertension: Secondary | ICD-10-CM | POA: Diagnosis not present

## 2020-05-15 DIAGNOSIS — Z856 Personal history of leukemia: Secondary | ICD-10-CM | POA: Diagnosis not present

## 2020-05-15 DIAGNOSIS — K1379 Other lesions of oral mucosa: Secondary | ICD-10-CM | POA: Diagnosis not present

## 2020-05-15 DIAGNOSIS — I714 Abdominal aortic aneurysm, without rupture: Secondary | ICD-10-CM | POA: Diagnosis not present

## 2020-05-15 DIAGNOSIS — Z8673 Personal history of transient ischemic attack (TIA), and cerebral infarction without residual deficits: Secondary | ICD-10-CM | POA: Diagnosis not present

## 2020-05-15 DIAGNOSIS — K219 Gastro-esophageal reflux disease without esophagitis: Secondary | ICD-10-CM | POA: Diagnosis not present

## 2020-05-15 DIAGNOSIS — I10 Essential (primary) hypertension: Secondary | ICD-10-CM | POA: Diagnosis not present

## 2020-05-15 DIAGNOSIS — E7800 Pure hypercholesterolemia, unspecified: Secondary | ICD-10-CM | POA: Diagnosis not present

## 2020-05-15 DIAGNOSIS — Z9181 History of falling: Secondary | ICD-10-CM | POA: Diagnosis not present

## 2020-05-15 DIAGNOSIS — K2270 Barrett's esophagus without dysplasia: Secondary | ICD-10-CM | POA: Diagnosis not present

## 2020-05-15 DIAGNOSIS — H9190 Unspecified hearing loss, unspecified ear: Secondary | ICD-10-CM | POA: Diagnosis not present

## 2020-05-15 DIAGNOSIS — G20 Parkinson's disease: Secondary | ICD-10-CM | POA: Diagnosis not present

## 2020-05-15 DIAGNOSIS — Z96653 Presence of artificial knee joint, bilateral: Secondary | ICD-10-CM | POA: Diagnosis not present

## 2020-05-15 DIAGNOSIS — M810 Age-related osteoporosis without current pathological fracture: Secondary | ICD-10-CM | POA: Diagnosis not present

## 2020-05-15 DIAGNOSIS — E042 Nontoxic multinodular goiter: Secondary | ICD-10-CM | POA: Diagnosis not present

## 2020-05-15 DIAGNOSIS — Z7902 Long term (current) use of antithrombotics/antiplatelets: Secondary | ICD-10-CM | POA: Diagnosis not present

## 2020-05-19 DIAGNOSIS — I10 Essential (primary) hypertension: Secondary | ICD-10-CM | POA: Diagnosis not present

## 2020-05-19 DIAGNOSIS — K2270 Barrett's esophagus without dysplasia: Secondary | ICD-10-CM | POA: Diagnosis not present

## 2020-05-19 DIAGNOSIS — K219 Gastro-esophageal reflux disease without esophagitis: Secondary | ICD-10-CM | POA: Diagnosis not present

## 2020-05-19 DIAGNOSIS — I714 Abdominal aortic aneurysm, without rupture: Secondary | ICD-10-CM | POA: Diagnosis not present

## 2020-05-19 DIAGNOSIS — G20 Parkinson's disease: Secondary | ICD-10-CM | POA: Diagnosis not present

## 2020-05-19 DIAGNOSIS — H9190 Unspecified hearing loss, unspecified ear: Secondary | ICD-10-CM | POA: Diagnosis not present

## 2020-05-19 DIAGNOSIS — E7800 Pure hypercholesterolemia, unspecified: Secondary | ICD-10-CM | POA: Diagnosis not present

## 2020-05-19 DIAGNOSIS — Z7902 Long term (current) use of antithrombotics/antiplatelets: Secondary | ICD-10-CM | POA: Diagnosis not present

## 2020-05-19 DIAGNOSIS — B370 Candidal stomatitis: Secondary | ICD-10-CM | POA: Diagnosis not present

## 2020-05-19 DIAGNOSIS — Z856 Personal history of leukemia: Secondary | ICD-10-CM | POA: Diagnosis not present

## 2020-05-19 DIAGNOSIS — K1379 Other lesions of oral mucosa: Secondary | ICD-10-CM | POA: Diagnosis not present

## 2020-05-19 DIAGNOSIS — Z96653 Presence of artificial knee joint, bilateral: Secondary | ICD-10-CM | POA: Diagnosis not present

## 2020-05-19 DIAGNOSIS — Z9181 History of falling: Secondary | ICD-10-CM | POA: Diagnosis not present

## 2020-05-19 DIAGNOSIS — M810 Age-related osteoporosis without current pathological fracture: Secondary | ICD-10-CM | POA: Diagnosis not present

## 2020-05-19 DIAGNOSIS — Z8673 Personal history of transient ischemic attack (TIA), and cerebral infarction without residual deficits: Secondary | ICD-10-CM | POA: Diagnosis not present

## 2020-05-19 DIAGNOSIS — I639 Cerebral infarction, unspecified: Secondary | ICD-10-CM | POA: Diagnosis not present

## 2020-05-19 DIAGNOSIS — E042 Nontoxic multinodular goiter: Secondary | ICD-10-CM | POA: Diagnosis not present

## 2020-05-23 DIAGNOSIS — Z856 Personal history of leukemia: Secondary | ICD-10-CM | POA: Diagnosis not present

## 2020-05-23 DIAGNOSIS — R5381 Other malaise: Secondary | ICD-10-CM | POA: Diagnosis not present

## 2020-05-23 DIAGNOSIS — Z96653 Presence of artificial knee joint, bilateral: Secondary | ICD-10-CM | POA: Diagnosis not present

## 2020-05-23 DIAGNOSIS — I714 Abdominal aortic aneurysm, without rupture: Secondary | ICD-10-CM | POA: Diagnosis not present

## 2020-05-23 DIAGNOSIS — E042 Nontoxic multinodular goiter: Secondary | ICD-10-CM | POA: Diagnosis not present

## 2020-05-23 DIAGNOSIS — E7800 Pure hypercholesterolemia, unspecified: Secondary | ICD-10-CM | POA: Diagnosis not present

## 2020-05-23 DIAGNOSIS — M810 Age-related osteoporosis without current pathological fracture: Secondary | ICD-10-CM | POA: Diagnosis not present

## 2020-05-23 DIAGNOSIS — K2270 Barrett's esophagus without dysplasia: Secondary | ICD-10-CM | POA: Diagnosis not present

## 2020-05-23 DIAGNOSIS — G20 Parkinson's disease: Secondary | ICD-10-CM | POA: Diagnosis not present

## 2020-05-23 DIAGNOSIS — Z9181 History of falling: Secondary | ICD-10-CM | POA: Diagnosis not present

## 2020-05-23 DIAGNOSIS — I10 Essential (primary) hypertension: Secondary | ICD-10-CM | POA: Diagnosis not present

## 2020-05-23 DIAGNOSIS — Z7902 Long term (current) use of antithrombotics/antiplatelets: Secondary | ICD-10-CM | POA: Diagnosis not present

## 2020-05-23 DIAGNOSIS — K219 Gastro-esophageal reflux disease without esophagitis: Secondary | ICD-10-CM | POA: Diagnosis not present

## 2020-05-23 DIAGNOSIS — H9190 Unspecified hearing loss, unspecified ear: Secondary | ICD-10-CM | POA: Diagnosis not present

## 2020-05-23 DIAGNOSIS — I639 Cerebral infarction, unspecified: Secondary | ICD-10-CM | POA: Diagnosis not present

## 2020-05-23 DIAGNOSIS — Z8673 Personal history of transient ischemic attack (TIA), and cerebral infarction without residual deficits: Secondary | ICD-10-CM | POA: Diagnosis not present

## 2020-05-23 DIAGNOSIS — K1379 Other lesions of oral mucosa: Secondary | ICD-10-CM | POA: Diagnosis not present

## 2020-05-24 ENCOUNTER — Other Ambulatory Visit: Payer: Self-pay | Admitting: Neurology

## 2020-05-24 DIAGNOSIS — R5381 Other malaise: Secondary | ICD-10-CM | POA: Diagnosis not present

## 2020-05-24 DIAGNOSIS — I639 Cerebral infarction, unspecified: Secondary | ICD-10-CM | POA: Diagnosis not present

## 2020-05-24 DIAGNOSIS — G20 Parkinson's disease: Secondary | ICD-10-CM | POA: Diagnosis not present

## 2020-05-24 NOTE — Telephone Encounter (Signed)
Pt have an appt 06/06/20 with Dr. Carles Collet. Please advise

## 2020-05-25 DIAGNOSIS — R5381 Other malaise: Secondary | ICD-10-CM | POA: Diagnosis not present

## 2020-05-25 DIAGNOSIS — G20 Parkinson's disease: Secondary | ICD-10-CM | POA: Diagnosis not present

## 2020-05-25 DIAGNOSIS — I639 Cerebral infarction, unspecified: Secondary | ICD-10-CM | POA: Diagnosis not present

## 2020-05-27 DIAGNOSIS — G20 Parkinson's disease: Secondary | ICD-10-CM | POA: Diagnosis not present

## 2020-05-27 DIAGNOSIS — R5381 Other malaise: Secondary | ICD-10-CM | POA: Diagnosis not present

## 2020-05-27 DIAGNOSIS — I639 Cerebral infarction, unspecified: Secondary | ICD-10-CM | POA: Diagnosis not present

## 2020-05-29 DIAGNOSIS — R5381 Other malaise: Secondary | ICD-10-CM | POA: Diagnosis not present

## 2020-05-29 DIAGNOSIS — G20 Parkinson's disease: Secondary | ICD-10-CM | POA: Diagnosis not present

## 2020-05-29 DIAGNOSIS — I639 Cerebral infarction, unspecified: Secondary | ICD-10-CM | POA: Diagnosis not present

## 2020-05-30 DIAGNOSIS — R5381 Other malaise: Secondary | ICD-10-CM | POA: Diagnosis not present

## 2020-05-30 DIAGNOSIS — I639 Cerebral infarction, unspecified: Secondary | ICD-10-CM | POA: Diagnosis not present

## 2020-05-30 DIAGNOSIS — G20 Parkinson's disease: Secondary | ICD-10-CM | POA: Diagnosis not present

## 2020-05-31 DIAGNOSIS — R5381 Other malaise: Secondary | ICD-10-CM | POA: Diagnosis not present

## 2020-05-31 DIAGNOSIS — I639 Cerebral infarction, unspecified: Secondary | ICD-10-CM | POA: Diagnosis not present

## 2020-05-31 DIAGNOSIS — G20 Parkinson's disease: Secondary | ICD-10-CM | POA: Diagnosis not present

## 2020-06-01 ENCOUNTER — Telehealth: Payer: Self-pay | Admitting: Neurology

## 2020-06-01 DIAGNOSIS — R5381 Other malaise: Secondary | ICD-10-CM | POA: Diagnosis not present

## 2020-06-01 DIAGNOSIS — I639 Cerebral infarction, unspecified: Secondary | ICD-10-CM | POA: Diagnosis not present

## 2020-06-01 DIAGNOSIS — G20 Parkinson's disease: Secondary | ICD-10-CM | POA: Diagnosis not present

## 2020-06-01 NOTE — Telephone Encounter (Signed)
Patient's son called in wanting to find out if they could do a virtual visit for the 06/06/20 appointment? He stated it is hard to get her out of the house.

## 2020-06-01 NOTE — Telephone Encounter (Signed)
ok 

## 2020-06-02 NOTE — Progress Notes (Signed)
Virtual Visit Via Video   The purpose of this virtual visit is to provide medical care while limiting exposure to the novel coronavirus.    Consent was obtained for video visit:  Yes.   Answered questions that patient had about telehealth interaction:  Yes.   I discussed the limitations, risks, security and privacy concerns of performing an evaluation and management service by telemedicine. I also discussed with the patient that there may be a patient responsible charge related to this service. The patient expressed understanding and agreed to proceed.  Pt location: Home Physician Location: office Name of referring provider:  [name], MD I connected with Katherine Novak at patients initiation/request on 06/06/2020 at 11:15 AM EDT by video enabled telemedicine application and verified that I am speaking with the correct person using two identifiers. Pt MRN:  [id number] Pt DOB:  1934/09/12 Video Participants:  Katherine Novak;  Daughter in law  Assessment/Plan:   1.  Parkinson's disease  -Continue carbidopa/levodopa 25/100, 2 tablets at 6 AM/10 AM/2 PM/6 PM  -Continue carbidopa/levodopa 50/200 at bedtime  -discussed lift chair - will have son call back if they want  -discussed trapeze bar over the bed - want to see how railing works first that they just installed next to bed  2.  MDD  -On mirtazapine, 15 mg daily  -add melatonin, 3-9 mg q hs for sleep and RBD  3.  Probable embolic cerebral infarction  -On Crestor, 10 mg daily  -On aspirin, 81 mg daily, as unable to prove atrial fibrillation thus far  -will f/u on with cardiology on referral.  Not sure that family would even want loop recorder  4.  L hip/groin spasm  -f/u pcp   Subjective   Patient seen in follow-up today for her Parkinson's disease as well as her history of cerebral infarction, likely embolic.  I sent a referral to cardiology on December 8.  I noted in January that had not been contacted and did contact  the son, but couldn't reach him.  Called cardiology today and they said they didn't realize   unfortunately, she was not contacted to schedule the appointment.  I was wanting evaluation for atrial fibrillation and evaluation for potential loop recorder (if they want that).  Her 30-day cardiac monitor was negative.  She has been doing ok in that regard since last visit but overall really slowing down.  There have been no falls.  Trouble getting out of the bed.  Have a bed rail now attached to bed.  PT coming out and they are doing some bathing as well - just started last week.   Having L hip/groin spasms.  Current movement d/o meds:  Carbidopa/levodopa 25/100, 2 tablets at 6 AM/1 tablet at 10 AM/2 tablets at 2 PM/2 tablet at 6 PM  Carbidopa/levodopa 50/200 at bedtime  Mirtazapine, 15 mg daily Plavix, 75 mg daily Aspirin, 81 mg daily   Current Outpatient Medications on File Prior to Visit  Medication Sig Dispense Refill  . acetaminophen (TYLENOL) 325 MG tablet Take 650 mg by mouth every 6 (six) hours as needed for mild pain.     Marland Kitchen alendronate (FOSAMAX) 70 MG tablet Take 70 mg by mouth every Wednesday.     . carbidopa-levodopa (SINEMET CR) 50-200 MG tablet TAKE 1 TABLET BY MOUTH EVERYDAY AT BEDTIME 90 tablet 1  . carbidopa-levodopa (SINEMET IR) 25-100 MG tablet TAKE 2 TABLETS BY MOUTH AT 6AM, 1 TAB AT 10AM, 2 TABS AT 2PM, 1  TAB AT 6PM 540 tablet 0  . clopidogrel (PLAVIX) 75 MG tablet Take 1 tablet (75 mg total) by mouth daily. 30 tablet 3  . furosemide (LASIX) 20 MG tablet Take 20 mg by mouth daily as needed for fluid.     . metoprolol tartrate (LOPRESSOR) 25 MG tablet Take 25 mg by mouth 2 (two) times daily.    . mirtazapine (REMERON) 15 MG tablet Take 1 tablet (15 mg total) by mouth at bedtime. 90 tablet 1  . omeprazole (PRILOSEC) 20 MG capsule Take 20 mg by mouth daily.    . rosuvastatin (CRESTOR) 10 MG tablet Take 1 tablet (10 mg total) by mouth daily. 30 tablet 3   No current  facility-administered medications on file prior to visit.     Objective   Vitals:   06/06/20 0953  Weight: 120 lb (54.4 kg)   GEN:  The patient appears stated age and is in NAD.  Neurological examination:  Orientation: The patient is alert.   Cranial nerves: There is good facial symmetry. There is facial hypomimia.  Cannot assess fluency/clarity of speech - doesn't speak my language. Soft palate rises symmetrically and there is no tongue deviation. Hearing is intact to conversational tone. Motor: Strength is at least antigravity x 4.   Shoulder shrug is equal and symmetric.  There is no pronator drift.  Movement examination: Tone: unable Abnormal movements: didn't see hands a rest via video Coordination:  There is slowness with RAM's, in the UE but couldn't see the LE Gait and Station: The patient rocks out of the sofa to arise.  Slow with the walker.  Turns en bloc    Follow up Instructions      -I discussed the assessment and treatment plan with the patient. The patient was provided an opportunity to ask questions and all were answered. The patient agreed with the plan and demonstrated an understanding of the instructions.   The patient was advised to call back or seek an in-person evaluation if the symptoms worsen or if the condition fails to improve as anticipated.    Total time spent on today's visit was 7minutes, including both face-to-face time and nonface-to-face time.  Time included that spent on review of records (prior notes available to me/labs/imaging if pertinent), discussing treatment and goals, answering patient's questions and coordinating care.   Alonza Bogus, DO

## 2020-06-02 NOTE — Telephone Encounter (Signed)
Changed appt to a virtual visit for patient thank you

## 2020-06-05 ENCOUNTER — Other Ambulatory Visit: Payer: Self-pay | Admitting: Neurology

## 2020-06-06 ENCOUNTER — Other Ambulatory Visit: Payer: Self-pay

## 2020-06-06 ENCOUNTER — Telehealth (INDEPENDENT_AMBULATORY_CARE_PROVIDER_SITE_OTHER): Payer: Medicare Other | Admitting: Neurology

## 2020-06-06 ENCOUNTER — Encounter: Payer: Self-pay | Admitting: Neurology

## 2020-06-06 VITALS — Wt 120.0 lb

## 2020-06-06 DIAGNOSIS — I63412 Cerebral infarction due to embolism of left middle cerebral artery: Secondary | ICD-10-CM | POA: Diagnosis not present

## 2020-06-06 DIAGNOSIS — G20 Parkinson's disease: Secondary | ICD-10-CM

## 2020-06-09 DIAGNOSIS — G20 Parkinson's disease: Secondary | ICD-10-CM | POA: Diagnosis not present

## 2020-06-09 DIAGNOSIS — I639 Cerebral infarction, unspecified: Secondary | ICD-10-CM | POA: Diagnosis not present

## 2020-06-10 DIAGNOSIS — G20 Parkinson's disease: Secondary | ICD-10-CM | POA: Diagnosis not present

## 2020-06-10 DIAGNOSIS — R5381 Other malaise: Secondary | ICD-10-CM | POA: Diagnosis not present

## 2020-06-10 DIAGNOSIS — I639 Cerebral infarction, unspecified: Secondary | ICD-10-CM | POA: Diagnosis not present

## 2020-06-13 DIAGNOSIS — R5381 Other malaise: Secondary | ICD-10-CM | POA: Diagnosis not present

## 2020-06-13 DIAGNOSIS — I639 Cerebral infarction, unspecified: Secondary | ICD-10-CM | POA: Diagnosis not present

## 2020-06-13 DIAGNOSIS — G20 Parkinson's disease: Secondary | ICD-10-CM | POA: Diagnosis not present

## 2020-06-14 DIAGNOSIS — G20 Parkinson's disease: Secondary | ICD-10-CM | POA: Diagnosis not present

## 2020-06-14 DIAGNOSIS — R5381 Other malaise: Secondary | ICD-10-CM | POA: Diagnosis not present

## 2020-06-14 DIAGNOSIS — I639 Cerebral infarction, unspecified: Secondary | ICD-10-CM | POA: Diagnosis not present

## 2020-06-15 DIAGNOSIS — R5381 Other malaise: Secondary | ICD-10-CM | POA: Diagnosis not present

## 2020-06-15 DIAGNOSIS — I639 Cerebral infarction, unspecified: Secondary | ICD-10-CM | POA: Diagnosis not present

## 2020-06-15 DIAGNOSIS — G20 Parkinson's disease: Secondary | ICD-10-CM | POA: Diagnosis not present

## 2020-06-16 DIAGNOSIS — R5381 Other malaise: Secondary | ICD-10-CM | POA: Diagnosis not present

## 2020-06-16 DIAGNOSIS — I639 Cerebral infarction, unspecified: Secondary | ICD-10-CM | POA: Diagnosis not present

## 2020-06-16 DIAGNOSIS — G20 Parkinson's disease: Secondary | ICD-10-CM | POA: Diagnosis not present

## 2020-06-19 DIAGNOSIS — G20 Parkinson's disease: Secondary | ICD-10-CM | POA: Diagnosis not present

## 2020-06-19 DIAGNOSIS — R5381 Other malaise: Secondary | ICD-10-CM | POA: Diagnosis not present

## 2020-06-19 DIAGNOSIS — I639 Cerebral infarction, unspecified: Secondary | ICD-10-CM | POA: Diagnosis not present

## 2020-06-20 ENCOUNTER — Other Ambulatory Visit: Payer: Self-pay

## 2020-06-20 ENCOUNTER — Encounter: Payer: Self-pay | Admitting: Cardiology

## 2020-06-20 ENCOUNTER — Ambulatory Visit: Payer: Medicare Other | Admitting: Cardiology

## 2020-06-20 VITALS — BP 151/71 | HR 69 | Temp 98.2°F | Ht 59.0 in | Wt 114.0 lb

## 2020-06-20 DIAGNOSIS — I10 Essential (primary) hypertension: Secondary | ICD-10-CM | POA: Diagnosis not present

## 2020-06-20 DIAGNOSIS — I639 Cerebral infarction, unspecified: Secondary | ICD-10-CM | POA: Diagnosis not present

## 2020-06-20 DIAGNOSIS — G20 Parkinson's disease: Secondary | ICD-10-CM | POA: Diagnosis not present

## 2020-06-20 DIAGNOSIS — R5381 Other malaise: Secondary | ICD-10-CM | POA: Diagnosis not present

## 2020-06-20 DIAGNOSIS — Z8673 Personal history of transient ischemic attack (TIA), and cerebral infarction without residual deficits: Secondary | ICD-10-CM | POA: Insufficient documentation

## 2020-06-20 MED ORDER — VALSARTAN-HYDROCHLOROTHIAZIDE 80-12.5 MG PO TABS: 1.0000 | ORAL_TABLET | Freq: Every day | ORAL | 1 refills | Status: DC

## 2020-06-20 NOTE — Progress Notes (Signed)
Patient referred by [name], MD for stroke  Subjective:   Katherine Novak, female    DOB: 13-Jul-1934, 86 y.o.   MRN: [id number]   Chief Complaint  Patient presents with   Hypertension   New Patient (Initial Visit)     HPI  86 year old [place] female with hypertension, Parkinson's disease, h/o cerebral infarction, possibly embolic in etiology  Patient is here today with her son Katherine Novak, who interprets for the patient.  Patient was hospitalized in 10/2019 with small left insular and frontal white matter infarct secondary to embolic source, concerning for occult atrial fibrillation.  Patient more cardiac event monitor for 30 days that did not show any atrial fibrillation.  She is currently on Aspirin 81 mg, Plavix 75 mg, along with Crestor 10 mg daily.  Since her stroke, her overall functional capacity is going on.  She requires help with most ADLs.  She walks short distances with walker at home.  Her biggest complaint is generalized weakness.  Katherine Novak's biggest concern is her blood pressure spikes during the middle of the day.  She has had blood pressure numbers as high as SBP 238 mmHg during the daytime, which tends to improve with an additional dose of metoprolol tartrate 25 mg.   Past Medical History:  Diagnosis Date   Arthritis    Ascending aorta dilatation (HCC)    Barrett's esophagus    Blood dyscrasia    leukemia cll   GERD (gastroesophageal reflux disease)    History of recurrent UTIs    Hypercholesterolemia    Hypertension    Leukemia ([place])    CLL observation  dr Inda Merlin cone   Osteoporosis    Stroke ([place])    Vitamin D deficiency      Past Surgical History:  Procedure Laterality Date   carpel tunnel-left     CATARACT EXTRACTION  bilateral   TOTAL KNEE ARTHROPLASTY Left 06/23/2012   Procedure: TOTAL KNEE ARTHROPLASTY;  Surgeon: Hessie Dibble, MD;  Location: [facility];  Service: Orthopedics;  Laterality: Left;  DEPUY, RNFA   TOTAL KNEE  ARTHROPLASTY Right 08/10/2013   Procedure: TOTAL KNEE ARTHROPLASTY;  Surgeon: Hessie Dibble, MD;  Location: [facility];  Service: Orthopedics;  Laterality: Right;     Social History   Tobacco Use  Smoking Status Never  Smokeless Tobacco Never    Social History   Substance and Sexual Activity  Alcohol Use No     Family History  Problem Relation Age of Onset   Cancer Brother      Current Outpatient Medications on File Prior to Visit  Medication Sig Dispense Refill   acetaminophen (TYLENOL) 325 MG tablet Take 650 mg by mouth every 6 (six) hours as needed for mild pain.      alendronate (FOSAMAX) 70 MG tablet Take 70 mg by mouth every Wednesday.      aspirin EC 81 MG tablet Take 81 mg by mouth daily. Swallow whole.     carbidopa-levodopa (SINEMET CR) 50-200 MG tablet TAKE 1 TABLET BY MOUTH EVERYDAY AT BEDTIME 90 tablet 1   carbidopa-levodopa (SINEMET IR) 25-100 MG tablet TAKE 2 TABLETS BY MOUTH AT 6AM, 1 TAB AT 10AM, 2 TABS AT 2PM, 1 TAB AT 6PM 540 tablet 0   clopidogrel (PLAVIX) 75 MG tablet Take 1 tablet (75 mg total) by mouth daily. 30 tablet 3   furosemide (LASIX) 20 MG tablet Take 20 mg by mouth daily as needed for fluid.      metoprolol  tartrate (LOPRESSOR) 25 MG tablet Take 25 mg by mouth 2 (two) times daily.     mirtazapine (REMERON) 15 MG tablet TAKE 1 TABLET BY MOUTH EVERYDAY AT BEDTIME 90 tablet 1   omeprazole (PRILOSEC) 20 MG capsule Take 20 mg by mouth daily.     rosuvastatin (CRESTOR) 10 MG tablet Take 1 tablet (10 mg total) by mouth daily. 30 tablet 3   No current facility-administered medications on file prior to visit.    Cardiovascular and other pertinent studies:  EKG 06/20/2020: Sinus rhythm 67 bpm  Normal EKG  Echocardiogram 12/10/2019:  1. Left ventricular ejection fraction, by estimation, is 60 to 65%. The  left ventricle has normal function. The left ventricle has no regional  wall motion abnormalities. Left ventricular diastolic parameters are   consistent with Grade I diastolic  dysfunction (impaired relaxation).   2. Right ventricular systolic function is normal. The right ventricular  size is normal.   3. The mitral valve is normal in structure. Trivial mitral valve  regurgitation.   4. The aortic valve is normal in structure. Aortic valve regurgitation is  mild.   Brain MRI/MRA 12/2019: 1. Small focus of acute ischemia within the left frontal white matter. No hemorrhage or mass effect. 2. No emergent large vessel occlusion or high-grade stenosis in the head or neck.     Recent labs: 12/09/2019: Glucose 103, BUN/Cr 8/0.68. EGFR >60. Na/K 143/3.8. >60. Rest of the CMP normal H/H 10.8/36.7. MCV 86. Platelets 168 HbA1C 6.3% Chol 196, TG 121, HDL 60, LDL 112 TSH N/A    Review of Systems  Constitutional: Positive for malaise/fatigue.  Cardiovascular:  Negative for chest pain, dyspnea on exertion, leg swelling, palpitations and syncope.  Musculoskeletal:  Positive for falls (Infrequent, last fall over a month ago.  No major injuries.).  Neurological:  Positive for tremors.        Vitals:   06/20/20 1521 06/20/20 1522  BP: (!) 153/72 (!) 151/71  Pulse: 64 69  Temp: 98.2 F (36.8 C)      Body mass index is 23.03 kg/m. Filed Weights   06/20/20 1521  Weight: 114 lb (51.7 kg)     Objective:   Physical Exam Vitals and nursing note reviewed.  Constitutional:      General: She is not in acute distress. Neck:     Vascular: No JVD.  Cardiovascular:     Rate and Rhythm: Normal rate and regular rhythm.     Heart sounds: Normal heart sounds. No murmur heard. Pulmonary:     Effort: Pulmonary effort is normal.     Breath sounds: Normal breath sounds. No wheezing or rales.  Musculoskeletal:     Right lower leg: Edema (Trace) present.     Left lower leg: Edema (Trace) present.        Assessment & Recommendations:   86 year old [place] female with hypertension, Parkinson's disease, h/o cerebral  infarction, possibly embolic in etiology  Primary hypertension: Spikes of hypertension, mostly during middle of the day.  Blood pressure usually controlled early morning and late evening when she takes metoprolol. Recommend starting valsartan-hydrochlorothiazide 80/12.5 mg daily for better blood pressure control.  If systolic blood pressure <595 mmHg, she may skip her metoprolol.  Encourage hydration to avoid dehydration, AKI.  Check BMP in 1 week.  In addition, arranged for remote patient monitoring through pur pharmacist Manuela Schwartz.  Stroke: Suspected cardioembolic etiology.  No significant residual deficit. It appears that patient has been taking aspirin 81 mg, and  clopidogrel 75 mg daily since her stroke in 12/2019.  I do not see need to continue dual antiplatelet therapy at this time.  I stopped clopidogrel.  We discussed potential cardioembolic etiology for her stroke.  She did not have any EKG during hospitalization, or on 30-day event monitor worn in 01/2020.  He will for another 2-week external monitor lipid levels.  He unfortunately implantable loop recorder will be high.  However, given that she has not had any A. fib, her advanced age, PE mutually decided to hold off on loop recorder placement, unless there is any recurrent TIA/stroke.  In absence of documented A. fib, decided not to start anticoagulation at this time.  Continue aspirin 81 mg daily.  As requested by the patient, I spoke with patient's other son Dr. Raelene Bott, who is a gastroenterologist in [place].  He is in agreement with my recommendations.   Further recommendations after remote blood pressure monitoring.  Thank you for referring the patient to [place]. Please feel free to contact with any questions.  Time spent: 60 min   Nigel Mormon, MD Pager: [phone] Office: [phone]

## 2020-06-21 DIAGNOSIS — R5381 Other malaise: Secondary | ICD-10-CM | POA: Diagnosis not present

## 2020-06-21 DIAGNOSIS — G20 Parkinson's disease: Secondary | ICD-10-CM | POA: Diagnosis not present

## 2020-06-21 DIAGNOSIS — I639 Cerebral infarction, unspecified: Secondary | ICD-10-CM | POA: Diagnosis not present

## 2020-06-22 DIAGNOSIS — R5381 Other malaise: Secondary | ICD-10-CM | POA: Diagnosis not present

## 2020-06-22 DIAGNOSIS — I639 Cerebral infarction, unspecified: Secondary | ICD-10-CM | POA: Diagnosis not present

## 2020-06-22 DIAGNOSIS — G20 Parkinson's disease: Secondary | ICD-10-CM | POA: Diagnosis not present

## 2020-06-23 DIAGNOSIS — I639 Cerebral infarction, unspecified: Secondary | ICD-10-CM | POA: Diagnosis not present

## 2020-06-23 DIAGNOSIS — G20 Parkinson's disease: Secondary | ICD-10-CM | POA: Diagnosis not present

## 2020-06-23 DIAGNOSIS — R5381 Other malaise: Secondary | ICD-10-CM | POA: Diagnosis not present

## 2020-06-27 DIAGNOSIS — R5381 Other malaise: Secondary | ICD-10-CM | POA: Diagnosis not present

## 2020-06-27 DIAGNOSIS — I639 Cerebral infarction, unspecified: Secondary | ICD-10-CM | POA: Diagnosis not present

## 2020-06-27 DIAGNOSIS — G20 Parkinson's disease: Secondary | ICD-10-CM | POA: Diagnosis not present

## 2020-06-28 DIAGNOSIS — G20 Parkinson's disease: Secondary | ICD-10-CM | POA: Diagnosis not present

## 2020-06-28 DIAGNOSIS — R5381 Other malaise: Secondary | ICD-10-CM | POA: Diagnosis not present

## 2020-06-28 DIAGNOSIS — I639 Cerebral infarction, unspecified: Secondary | ICD-10-CM | POA: Diagnosis not present

## 2020-06-29 DIAGNOSIS — I639 Cerebral infarction, unspecified: Secondary | ICD-10-CM | POA: Diagnosis not present

## 2020-06-29 DIAGNOSIS — R5381 Other malaise: Secondary | ICD-10-CM | POA: Diagnosis not present

## 2020-06-29 DIAGNOSIS — G20 Parkinson's disease: Secondary | ICD-10-CM | POA: Diagnosis not present

## 2020-07-03 DIAGNOSIS — I639 Cerebral infarction, unspecified: Secondary | ICD-10-CM | POA: Diagnosis not present

## 2020-07-03 DIAGNOSIS — G20 Parkinson's disease: Secondary | ICD-10-CM | POA: Diagnosis not present

## 2020-07-03 DIAGNOSIS — R5381 Other malaise: Secondary | ICD-10-CM | POA: Diagnosis not present

## 2020-07-04 ENCOUNTER — Ambulatory Visit
Admission: RE | Admit: 2020-07-04 | Discharge: 2020-07-04 | Disposition: A | Discharge status: Home / Self Care | Payer: Medicare Other | Source: Ambulatory Visit | Attending: Family Medicine | Admitting: Family Medicine

## 2020-07-04 ENCOUNTER — Other Ambulatory Visit: Payer: Self-pay

## 2020-07-04 ENCOUNTER — Other Ambulatory Visit: Payer: Self-pay | Admitting: Family Medicine

## 2020-07-04 ENCOUNTER — Telehealth: Payer: Self-pay

## 2020-07-04 DIAGNOSIS — I878 Other specified disorders of veins: Secondary | ICD-10-CM | POA: Diagnosis not present

## 2020-07-04 DIAGNOSIS — W19XXXA Unspecified fall, initial encounter: Secondary | ICD-10-CM | POA: Diagnosis not present

## 2020-07-04 DIAGNOSIS — M25552 Pain in left hip: Secondary | ICD-10-CM | POA: Diagnosis not present

## 2020-07-04 DIAGNOSIS — R5381 Other malaise: Secondary | ICD-10-CM | POA: Diagnosis not present

## 2020-07-04 DIAGNOSIS — I639 Cerebral infarction, unspecified: Secondary | ICD-10-CM | POA: Diagnosis not present

## 2020-07-04 DIAGNOSIS — G20 Parkinson's disease: Secondary | ICD-10-CM | POA: Diagnosis not present

## 2020-07-04 DIAGNOSIS — M545 Low back pain, unspecified: Secondary | ICD-10-CM | POA: Diagnosis not present

## 2020-07-04 DIAGNOSIS — I10 Essential (primary) hypertension: Secondary | ICD-10-CM | POA: Diagnosis not present

## 2020-07-04 DIAGNOSIS — M47816 Spondylosis without myelopathy or radiculopathy, lumbar region: Secondary | ICD-10-CM | POA: Diagnosis not present

## 2020-07-04 IMAGING — CR DG HIP (WITH OR WITHOUT PELVIS) 2-3V*L*
3 series · 3 of 3 positions shown · non-contrast
Comparison: [REDACTED] [TW]

CLINICAL DATA: Pain, fall 3 days ago

EXAM:
DG HIP (WITH OR WITHOUT PELVIS) 2-3V LEFT

[t hip ap left (1 of 2)]
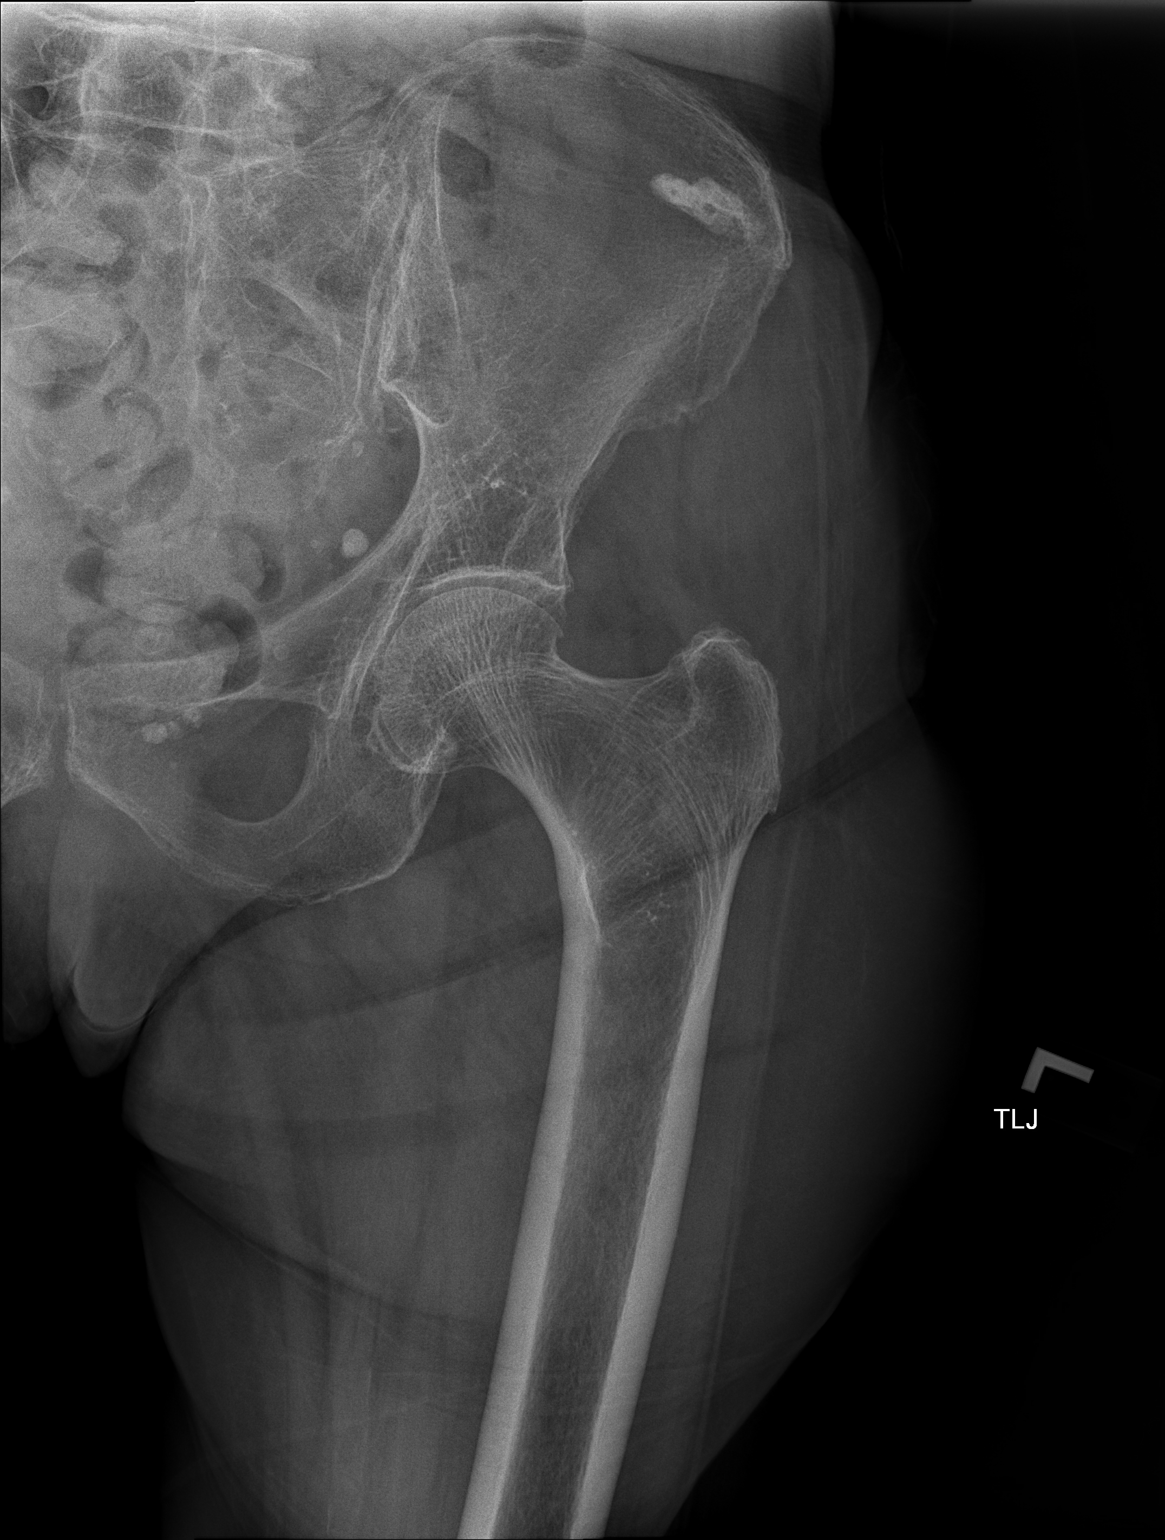

[t hip ap left (2 of 2)]
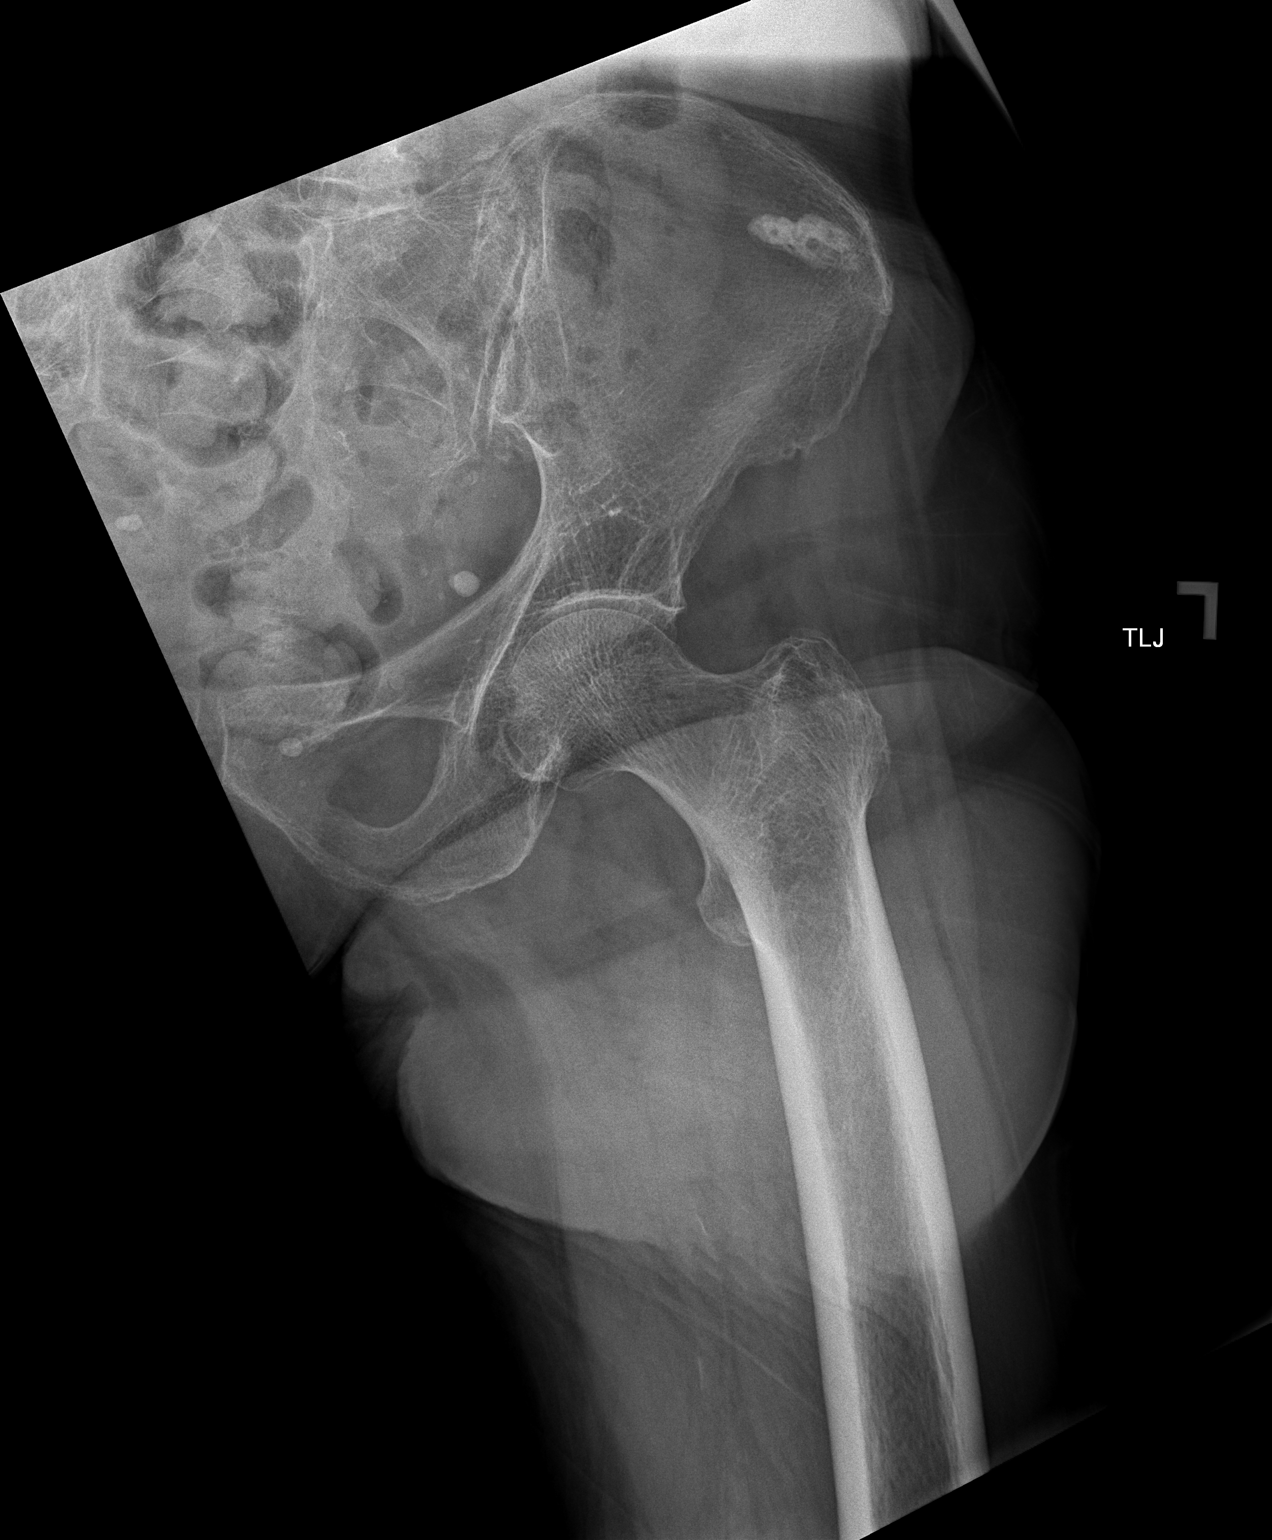

[t pelvis ap]
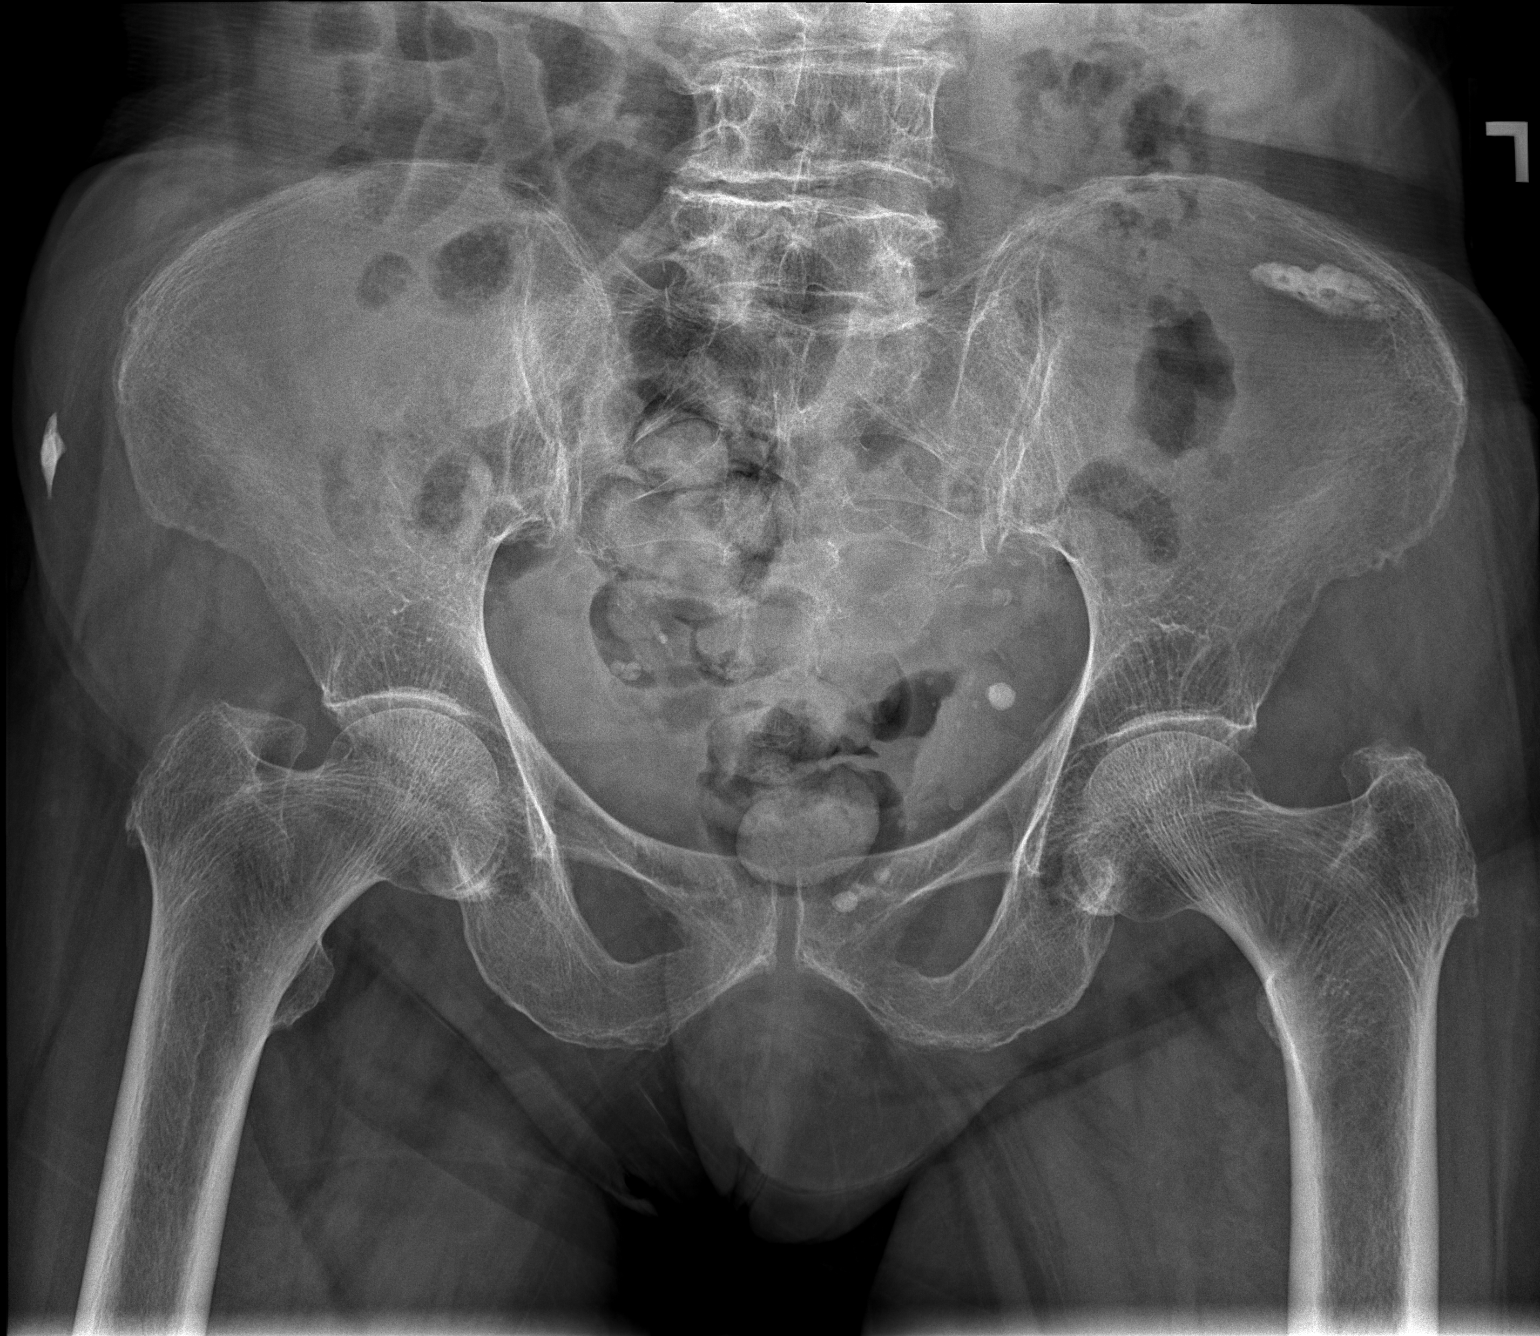

[3 of 3 positions shown; findings below may reference images not displayed]

FINDINGS: Osteopenia. No acute fracture or dislocation. Limited assessment of
the sacrum secondary to overlying bowel gas. Degenerative changes of
the lower lumbar spine. No area of erosion or osseous destruction.
No unexpected radiopaque foreign body. Calcific density of the LEFT
gluteal soft tissues unchanged and consistent with sequela of prior
trauma/fat necrosis. Pelvic phleboliths. Vascular calcifications.
IMPRESSION: No acute fracture or dislocation. If persistent concern for
nondisplaced hip or pelvic fracture, recommend dedicated pelvic MRI.

## 2020-07-04 NOTE — Telephone Encounter (Signed)
Agree with taking 1/2 pill for now. Recommend liberal hydration (2-3 liters/day). Continue monitoring blood pressure as you are doing.  Thanks MJP

## 2020-07-04 NOTE — Telephone Encounter (Signed)
Patient's son called back and he is aware

## 2020-07-04 NOTE — Telephone Encounter (Signed)
Tried calling patient no answer left a vm

## 2020-07-05 DIAGNOSIS — G20 Parkinson's disease: Secondary | ICD-10-CM | POA: Diagnosis not present

## 2020-07-05 DIAGNOSIS — R5381 Other malaise: Secondary | ICD-10-CM | POA: Diagnosis not present

## 2020-07-05 DIAGNOSIS — I639 Cerebral infarction, unspecified: Secondary | ICD-10-CM | POA: Diagnosis not present

## 2020-07-06 DIAGNOSIS — R5381 Other malaise: Secondary | ICD-10-CM | POA: Diagnosis not present

## 2020-07-06 DIAGNOSIS — I639 Cerebral infarction, unspecified: Secondary | ICD-10-CM | POA: Diagnosis not present

## 2020-07-06 DIAGNOSIS — I10 Essential (primary) hypertension: Secondary | ICD-10-CM | POA: Diagnosis not present

## 2020-07-06 DIAGNOSIS — G20 Parkinson's disease: Secondary | ICD-10-CM | POA: Diagnosis not present

## 2020-07-09 DIAGNOSIS — G20 Parkinson's disease: Secondary | ICD-10-CM | POA: Diagnosis not present

## 2020-07-09 DIAGNOSIS — I639 Cerebral infarction, unspecified: Secondary | ICD-10-CM | POA: Diagnosis not present

## 2020-07-10 DIAGNOSIS — I639 Cerebral infarction, unspecified: Secondary | ICD-10-CM | POA: Diagnosis not present

## 2020-07-10 DIAGNOSIS — G20 Parkinson's disease: Secondary | ICD-10-CM | POA: Diagnosis not present

## 2020-07-10 DIAGNOSIS — R5381 Other malaise: Secondary | ICD-10-CM | POA: Diagnosis not present

## 2020-07-11 ENCOUNTER — Other Ambulatory Visit: Payer: Self-pay | Admitting: Neurology

## 2020-07-11 DIAGNOSIS — R5381 Other malaise: Secondary | ICD-10-CM | POA: Diagnosis not present

## 2020-07-11 DIAGNOSIS — I639 Cerebral infarction, unspecified: Secondary | ICD-10-CM | POA: Diagnosis not present

## 2020-07-11 DIAGNOSIS — G20 Parkinson's disease: Secondary | ICD-10-CM | POA: Diagnosis not present

## 2020-07-12 ENCOUNTER — Other Ambulatory Visit: Payer: Self-pay | Admitting: Cardiology

## 2020-07-12 DIAGNOSIS — G20 Parkinson's disease: Secondary | ICD-10-CM | POA: Diagnosis not present

## 2020-07-12 DIAGNOSIS — I10 Essential (primary) hypertension: Secondary | ICD-10-CM

## 2020-07-12 DIAGNOSIS — R5381 Other malaise: Secondary | ICD-10-CM | POA: Diagnosis not present

## 2020-07-12 DIAGNOSIS — I639 Cerebral infarction, unspecified: Secondary | ICD-10-CM | POA: Diagnosis not present

## 2020-07-13 DIAGNOSIS — I639 Cerebral infarction, unspecified: Secondary | ICD-10-CM | POA: Diagnosis not present

## 2020-07-13 DIAGNOSIS — G20 Parkinson's disease: Secondary | ICD-10-CM | POA: Diagnosis not present

## 2020-07-13 DIAGNOSIS — R5381 Other malaise: Secondary | ICD-10-CM | POA: Diagnosis not present

## 2020-07-14 DIAGNOSIS — I639 Cerebral infarction, unspecified: Secondary | ICD-10-CM | POA: Diagnosis not present

## 2020-07-14 DIAGNOSIS — R5381 Other malaise: Secondary | ICD-10-CM | POA: Diagnosis not present

## 2020-07-14 DIAGNOSIS — G20 Parkinson's disease: Secondary | ICD-10-CM | POA: Diagnosis not present

## 2020-07-15 DIAGNOSIS — G20 Parkinson's disease: Secondary | ICD-10-CM | POA: Diagnosis not present

## 2020-07-15 DIAGNOSIS — I639 Cerebral infarction, unspecified: Secondary | ICD-10-CM | POA: Diagnosis not present

## 2020-07-15 DIAGNOSIS — R5381 Other malaise: Secondary | ICD-10-CM | POA: Diagnosis not present

## 2020-07-17 DIAGNOSIS — R5381 Other malaise: Secondary | ICD-10-CM | POA: Diagnosis not present

## 2020-07-17 DIAGNOSIS — I639 Cerebral infarction, unspecified: Secondary | ICD-10-CM | POA: Diagnosis not present

## 2020-07-17 DIAGNOSIS — G20 Parkinson's disease: Secondary | ICD-10-CM | POA: Diagnosis not present

## 2020-07-18 DIAGNOSIS — R5381 Other malaise: Secondary | ICD-10-CM | POA: Diagnosis not present

## 2020-07-18 DIAGNOSIS — I639 Cerebral infarction, unspecified: Secondary | ICD-10-CM | POA: Diagnosis not present

## 2020-07-18 DIAGNOSIS — G20 Parkinson's disease: Secondary | ICD-10-CM | POA: Diagnosis not present

## 2020-07-19 DIAGNOSIS — G20 Parkinson's disease: Secondary | ICD-10-CM | POA: Diagnosis not present

## 2020-07-19 DIAGNOSIS — R5381 Other malaise: Secondary | ICD-10-CM | POA: Diagnosis not present

## 2020-07-19 DIAGNOSIS — I639 Cerebral infarction, unspecified: Secondary | ICD-10-CM | POA: Diagnosis not present

## 2020-07-20 DIAGNOSIS — G20 Parkinson's disease: Secondary | ICD-10-CM | POA: Diagnosis not present

## 2020-07-20 DIAGNOSIS — I639 Cerebral infarction, unspecified: Secondary | ICD-10-CM | POA: Diagnosis not present

## 2020-07-20 DIAGNOSIS — R5381 Other malaise: Secondary | ICD-10-CM | POA: Diagnosis not present

## 2020-07-21 DIAGNOSIS — R5381 Other malaise: Secondary | ICD-10-CM | POA: Diagnosis not present

## 2020-07-21 DIAGNOSIS — I10 Essential (primary) hypertension: Secondary | ICD-10-CM | POA: Diagnosis not present

## 2020-07-21 DIAGNOSIS — I639 Cerebral infarction, unspecified: Secondary | ICD-10-CM | POA: Diagnosis not present

## 2020-07-21 DIAGNOSIS — G20 Parkinson's disease: Secondary | ICD-10-CM | POA: Diagnosis not present

## 2020-07-22 LAB — BASIC METABOLIC PANEL
BUN/Creatinine Ratio: 18 (ref 12–28)
BUN: 14 mg/dL (ref 8–27)
CO2: 24 mmol/L (ref 20–29)
Calcium: 9 mg/dL (ref 8.7–10.3)
Chloride: 105 mmol/L (ref 96–106)
Creatinine, Ser: 0.79 mg/dL (ref 0.57–1.00)
Glucose: 96 mg/dL (ref 65–99)
Potassium: 4.7 mmol/L (ref 3.5–5.2)
Sodium: 142 mmol/L (ref 134–144)
eGFR: 74 mL/min/{1.73_m2} (ref >59)

## 2020-07-24 DIAGNOSIS — I639 Cerebral infarction, unspecified: Secondary | ICD-10-CM | POA: Diagnosis not present

## 2020-07-24 DIAGNOSIS — G20 Parkinson's disease: Secondary | ICD-10-CM | POA: Diagnosis not present

## 2020-07-24 DIAGNOSIS — R5381 Other malaise: Secondary | ICD-10-CM | POA: Diagnosis not present

## 2020-07-25 DIAGNOSIS — I639 Cerebral infarction, unspecified: Secondary | ICD-10-CM | POA: Diagnosis not present

## 2020-07-25 DIAGNOSIS — R5381 Other malaise: Secondary | ICD-10-CM | POA: Diagnosis not present

## 2020-07-25 DIAGNOSIS — G20 Parkinson's disease: Secondary | ICD-10-CM | POA: Diagnosis not present

## 2020-07-26 DIAGNOSIS — I639 Cerebral infarction, unspecified: Secondary | ICD-10-CM | POA: Diagnosis not present

## 2020-07-26 DIAGNOSIS — G20 Parkinson's disease: Secondary | ICD-10-CM | POA: Diagnosis not present

## 2020-07-26 DIAGNOSIS — R5381 Other malaise: Secondary | ICD-10-CM | POA: Diagnosis not present

## 2020-07-27 ENCOUNTER — Telehealth: Payer: Self-pay | Admitting: Pharmacist

## 2020-07-27 ENCOUNTER — Other Ambulatory Visit: Payer: Self-pay | Admitting: Neurology

## 2020-07-27 DIAGNOSIS — I639 Cerebral infarction, unspecified: Secondary | ICD-10-CM | POA: Diagnosis not present

## 2020-07-27 DIAGNOSIS — G20 Parkinson's disease: Secondary | ICD-10-CM | POA: Diagnosis not present

## 2020-07-27 DIAGNOSIS — R5381 Other malaise: Secondary | ICD-10-CM | POA: Diagnosis not present

## 2020-07-28 DIAGNOSIS — G20 Parkinson's disease: Secondary | ICD-10-CM | POA: Diagnosis not present

## 2020-07-28 DIAGNOSIS — I639 Cerebral infarction, unspecified: Secondary | ICD-10-CM | POA: Diagnosis not present

## 2020-07-28 DIAGNOSIS — R5381 Other malaise: Secondary | ICD-10-CM | POA: Diagnosis not present

## 2020-07-31 DIAGNOSIS — G20 Parkinson's disease: Secondary | ICD-10-CM | POA: Diagnosis not present

## 2020-07-31 DIAGNOSIS — R5381 Other malaise: Secondary | ICD-10-CM | POA: Diagnosis not present

## 2020-07-31 DIAGNOSIS — I639 Cerebral infarction, unspecified: Secondary | ICD-10-CM | POA: Diagnosis not present

## 2020-08-01 DIAGNOSIS — I639 Cerebral infarction, unspecified: Secondary | ICD-10-CM | POA: Diagnosis not present

## 2020-08-01 DIAGNOSIS — G20 Parkinson's disease: Secondary | ICD-10-CM | POA: Diagnosis not present

## 2020-08-01 DIAGNOSIS — M545 Low back pain, unspecified: Secondary | ICD-10-CM | POA: Diagnosis not present

## 2020-08-01 DIAGNOSIS — R5381 Other malaise: Secondary | ICD-10-CM | POA: Diagnosis not present

## 2020-08-02 ENCOUNTER — Telehealth: Payer: Medicare Other | Admitting: Cardiology

## 2020-08-02 ENCOUNTER — Encounter: Payer: Self-pay | Admitting: Cardiology

## 2020-08-02 ENCOUNTER — Other Ambulatory Visit: Payer: Self-pay

## 2020-08-02 VITALS — BP 99/66 | HR 85 | Ht 59.0 in | Wt 114.0 lb

## 2020-08-02 DIAGNOSIS — M545 Low back pain, unspecified: Secondary | ICD-10-CM | POA: Diagnosis not present

## 2020-08-02 DIAGNOSIS — I639 Cerebral infarction, unspecified: Secondary | ICD-10-CM | POA: Diagnosis not present

## 2020-08-02 DIAGNOSIS — I10 Essential (primary) hypertension: Secondary | ICD-10-CM

## 2020-08-02 DIAGNOSIS — G20 Parkinson's disease: Secondary | ICD-10-CM | POA: Diagnosis not present

## 2020-08-02 DIAGNOSIS — R5381 Other malaise: Secondary | ICD-10-CM | POA: Diagnosis not present

## 2020-08-02 MED ORDER — VALSARTAN 80 MG PO TABS: 80.0000 mg | ORAL_TABLET | ORAL | 1 refills | Status: DC

## 2020-08-02 NOTE — Progress Notes (Signed)
Patient referred by Katherine Jordan, MD for stroke  Subjective:   Katherine Novak, female    DOB: [birth year], 86 y.o.   MRN: [id number]  I connected with the patient on 08/02/2020@TODAY @ by a telephone call and verified that I am speaking with the correct person using two identifiers.     I offered the patient a video enabled application for a virtual visit. Unfortunately, this could not be accomplished due to technical difficulties/lack of video enabled phone/computer. I discussed the limitations of evaluation and management by telemedicine and the availability of in person appointments. The patient expressed understanding and agreed to proceed.   This visit type was conducted due to national recommendations for restrictions regarding the COVID-19 Pandemic (e.g. social distancing).  This format is felt to be most appropriate for this patient at this time.  All issues noted in this document were discussed and addressed.  No physical exam was performed (except for noted visual exam findings with Tele health visits).  The patient has consented to conduct a Tele health visit and understands insurance will be billed.   Chief Complaint  Patient presents with   Follow-up    4 WEEKS   Hypertension     HPI  86 year old [place] female with hypertension, Parkinson's disease, h/o cerebral infarction, possibly embolic in etiology  I spoke with patient's son Katherine Novak, who manages patient's medications.  It appears that after starting valsartan-hydrochlorothiazide 80-12.5 mg daily, she had few episodes of lightheadedness and hypotension.  Therefore, this was reduced down to half tablet of valsartan-hydrochlorothiazide 80-12.5 mg every other day.  With this, blood pressure is reasonably well controlled, with the following exception.  On certain days, her morning blood pressure is around 110/70 mmHg.  If they hold her valsartan-hydrochlorothiazide dose, blood pressure increases to 443 systolic  later in the day.  Her episodes of lightheadedness have improved.  She has not been taking metoprolol for last several weeks.   Current Outpatient Medications on File Prior to Visit  Medication Sig Dispense Refill   acetaminophen (TYLENOL) 325 MG tablet Take 650 mg by mouth every 6 (six) hours as needed for mild pain.      alendronate (FOSAMAX) 70 MG tablet Take 70 mg by mouth every Wednesday.      aspirin EC 81 MG tablet Take 81 mg by mouth daily. Swallow whole.     carbidopa-levodopa (SINEMET CR) 50-200 MG tablet TAKE 1 TABLET BY MOUTH EVERYDAY AT BEDTIME 90 tablet 1   carbidopa-levodopa (SINEMET IR) 25-100 MG tablet TAKE 2 TABLETS BY MOUTH AT 6AM, 1 TAB AT 10AM, 2 TABS AT 2PM, 1 TAB AT 6PM 540 tablet 0   diclofenac Sodium (VOLTAREN) 1 % GEL Apply 4 g topically 4 (four) times daily.     furosemide (LASIX) 20 MG tablet Take 20 mg by mouth daily as needed for fluid.      metoprolol tartrate (LOPRESSOR) 25 MG tablet Take 25 mg by mouth 2 (two) times daily.     mirtazapine (REMERON) 15 MG tablet TAKE 1 TABLET BY MOUTH EVERYDAY AT BEDTIME 90 tablet 1   omeprazole (PRILOSEC) 20 MG capsule Take 20 mg by mouth daily.     rosuvastatin (CRESTOR) 10 MG tablet Take 1 tablet (10 mg total) by mouth daily. 30 tablet 3   traMADol (ULTRAM) 50 MG tablet Take 50 mg by mouth every 4 (four) hours as needed.     valsartan-hydrochlorothiazide (DIOVAN-HCT) 80-12.5 MG tablet TAKE 1 TABLET BY MOUTH EVERY DAY (  Patient taking differently: Take 0.5 tablets by mouth every other day.) 30 tablet 1   No current facility-administered medications on file prior to visit.    Cardiovascular and other pertinent studies:  EKG 06/20/2020: Sinus rhythm 67 bpm  Normal EKG  Echocardiogram 12/10/2019:  1. Left ventricular ejection fraction, by estimation, is 60 to 65%. The  left ventricle has normal function. The left ventricle has no regional  wall motion abnormalities. Left ventricular diastolic parameters are  consistent with  Grade I diastolic  dysfunction (impaired relaxation).   2. Right ventricular systolic function is normal. The right ventricular  size is normal.   3. The mitral valve is normal in structure. Trivial mitral valve  regurgitation.   4. The aortic valve is normal in structure. Aortic valve regurgitation is  mild.   Brain MRI/MRA 12/2019: 1. Small focus of acute ischemia within the left frontal white matter. No hemorrhage or mass effect. 2. No emergent large vessel occlusion or high-grade stenosis in the head or neck.     Recent labs: 07/21/2020: Glucose 96, BUN/Cr 14/0.79. EGFR 74. Na/K 142/4.7.   12/09/2019: Glucose 103, BUN/Cr 8/0.68. EGFR >60. Na/K 143/3.8. >60. Rest of the CMP normal H/H 10.8/36.7. MCV 86. Platelets 168 HbA1C 6.3% Chol 196, TG 121, HDL 60, LDL 112 TSH N/A    Review of Systems  Cardiovascular:  Negative for chest pain, dyspnea on exertion, leg swelling, palpitations and syncope.  Musculoskeletal:  Positive for falls.        Vitals:   08/02/20 0951  BP: 99/66  Pulse: 85     Body mass index is 23.03 kg/m. Filed Weights   08/02/20 0951  Weight: 114 lb (51.7 kg)     Objective:   Physical Exam Not performed. Telephone visit     Assessment & Recommendations:   86 year old Katherine Novak female with hypertension, Parkinson's disease, h/o cerebral infarction, possibly embolic in etiology  Primary hypertension: To avoid episodes of lightheadedness, and also avoid breaking in unscored tablet in half, I recommended the following. Stop valsartan-hydrochlorothiazide 80-12.5 mg half tablet every other day. Started valsartan 80 mg, take 1 tablet every other day. Continue home blood pressure monitoring. Renal function preserved, without any hyperkalemia.  I also recommended the following: If morning blood pressure shows SBP less than 100 mmHg on the day of her medication dosage, repeat blood pressure in 1 hour.  If still remains less than 100 mmHg,  repeated again in the afternoon.  Avoid skipping the dose altogether, as she does have spikes in her blood pressure without the medication.  Stroke: Suspected cardioembolic etiology.  No significant residual deficit. As previously described, patient and family have opted to hold off loop recorder placement, unless there is any recurrent TIA/stroke. In absence of documented A. fib, decided not to start anticoagulation at this time.  Continue aspirin 81 mg daily.  F/u in 4 weeks   Nigel Mormon, MD Pager: [phone] Office: [phone]

## 2020-08-03 DIAGNOSIS — I639 Cerebral infarction, unspecified: Secondary | ICD-10-CM | POA: Diagnosis not present

## 2020-08-03 DIAGNOSIS — R5381 Other malaise: Secondary | ICD-10-CM | POA: Diagnosis not present

## 2020-08-03 DIAGNOSIS — G20 Parkinson's disease: Secondary | ICD-10-CM | POA: Diagnosis not present

## 2020-08-04 DIAGNOSIS — S32040A Wedge compression fracture of fourth lumbar vertebra, initial encounter for closed fracture: Secondary | ICD-10-CM | POA: Diagnosis not present

## 2020-08-04 DIAGNOSIS — R5381 Other malaise: Secondary | ICD-10-CM | POA: Diagnosis not present

## 2020-08-04 DIAGNOSIS — I639 Cerebral infarction, unspecified: Secondary | ICD-10-CM | POA: Diagnosis not present

## 2020-08-04 DIAGNOSIS — G20 Parkinson's disease: Secondary | ICD-10-CM | POA: Diagnosis not present

## 2020-08-07 DIAGNOSIS — I639 Cerebral infarction, unspecified: Secondary | ICD-10-CM | POA: Diagnosis not present

## 2020-08-07 DIAGNOSIS — R5381 Other malaise: Secondary | ICD-10-CM | POA: Diagnosis not present

## 2020-08-07 DIAGNOSIS — G20 Parkinson's disease: Secondary | ICD-10-CM | POA: Diagnosis not present

## 2020-08-08 ENCOUNTER — Other Ambulatory Visit: Payer: Self-pay | Admitting: Cardiology

## 2020-08-08 DIAGNOSIS — G20 Parkinson's disease: Secondary | ICD-10-CM | POA: Diagnosis not present

## 2020-08-08 DIAGNOSIS — I10 Essential (primary) hypertension: Secondary | ICD-10-CM

## 2020-08-08 DIAGNOSIS — I639 Cerebral infarction, unspecified: Secondary | ICD-10-CM | POA: Diagnosis not present

## 2020-08-08 DIAGNOSIS — R5381 Other malaise: Secondary | ICD-10-CM | POA: Diagnosis not present

## 2020-08-09 DIAGNOSIS — G20 Parkinson's disease: Secondary | ICD-10-CM | POA: Diagnosis not present

## 2020-08-09 DIAGNOSIS — I639 Cerebral infarction, unspecified: Secondary | ICD-10-CM | POA: Diagnosis not present

## 2020-08-09 DIAGNOSIS — R5381 Other malaise: Secondary | ICD-10-CM | POA: Diagnosis not present

## 2020-08-11 DIAGNOSIS — I10 Essential (primary) hypertension: Secondary | ICD-10-CM | POA: Diagnosis not present

## 2020-08-11 DIAGNOSIS — I639 Cerebral infarction, unspecified: Secondary | ICD-10-CM | POA: Diagnosis not present

## 2020-08-11 DIAGNOSIS — G20 Parkinson's disease: Secondary | ICD-10-CM | POA: Diagnosis not present

## 2020-08-11 DIAGNOSIS — R5381 Other malaise: Secondary | ICD-10-CM | POA: Diagnosis not present

## 2020-08-11 NOTE — Telephone Encounter (Signed)
BP continues to remain soft despite transitioning from valsartan-HCTZ 80/12.5 mg to valsartan 80 mg QOD. Avg BP over the past week of 106 [75-140]/ 62  [41-81]. Son tried cutting the dose to 40 mg QOD without significant changes. Continues to hold metoprolol when SBP <110. Denies any recent lasix dose. Son would like to continue trial with holding valsartan completely for a few days and sees improvement in home BP readings. Pt denies any complains of lightheadedness, dizziness, syncope. Encouraged pt to increase her fluid and oral intake in  the meantime.

## 2020-08-12 DIAGNOSIS — I639 Cerebral infarction, unspecified: Secondary | ICD-10-CM | POA: Diagnosis not present

## 2020-08-12 DIAGNOSIS — G20 Parkinson's disease: Secondary | ICD-10-CM | POA: Diagnosis not present

## 2020-08-12 DIAGNOSIS — R5381 Other malaise: Secondary | ICD-10-CM | POA: Diagnosis not present

## 2020-08-14 DIAGNOSIS — I639 Cerebral infarction, unspecified: Secondary | ICD-10-CM | POA: Diagnosis not present

## 2020-08-14 DIAGNOSIS — R5381 Other malaise: Secondary | ICD-10-CM | POA: Diagnosis not present

## 2020-08-14 DIAGNOSIS — G20 Parkinson's disease: Secondary | ICD-10-CM | POA: Diagnosis not present

## 2020-08-15 DIAGNOSIS — G20 Parkinson's disease: Secondary | ICD-10-CM | POA: Diagnosis not present

## 2020-08-15 DIAGNOSIS — I639 Cerebral infarction, unspecified: Secondary | ICD-10-CM | POA: Diagnosis not present

## 2020-08-15 DIAGNOSIS — R5381 Other malaise: Secondary | ICD-10-CM | POA: Diagnosis not present

## 2020-08-16 DIAGNOSIS — G20 Parkinson's disease: Secondary | ICD-10-CM | POA: Diagnosis not present

## 2020-08-16 DIAGNOSIS — R5381 Other malaise: Secondary | ICD-10-CM | POA: Diagnosis not present

## 2020-08-16 DIAGNOSIS — I639 Cerebral infarction, unspecified: Secondary | ICD-10-CM | POA: Diagnosis not present

## 2020-08-17 DIAGNOSIS — G20 Parkinson's disease: Secondary | ICD-10-CM | POA: Diagnosis not present

## 2020-08-17 DIAGNOSIS — I639 Cerebral infarction, unspecified: Secondary | ICD-10-CM | POA: Diagnosis not present

## 2020-08-17 DIAGNOSIS — R5381 Other malaise: Secondary | ICD-10-CM | POA: Diagnosis not present

## 2020-08-18 ENCOUNTER — Telehealth: Payer: Self-pay | Admitting: Pharmacist

## 2020-08-18 DIAGNOSIS — G20 Parkinson's disease: Secondary | ICD-10-CM | POA: Diagnosis not present

## 2020-08-18 DIAGNOSIS — I639 Cerebral infarction, unspecified: Secondary | ICD-10-CM | POA: Diagnosis not present

## 2020-08-18 DIAGNOSIS — R5381 Other malaise: Secondary | ICD-10-CM | POA: Diagnosis not present

## 2020-08-18 NOTE — Telephone Encounter (Signed)
Remote BP continues to remain soft following holding valsartan. Avg BP over the past week of 112/64. Lowest BP this morning of 79/42. Per son, pt had mild episode of lightheadedness, dizziness. Denies syncope or falls. Continues to hold metoprolol tartrate when SBP <110. Denies any recent lasix use. Encouraged pt to continue increasing hydration levels and wearing compression stockings. Will continue to monitor home BP.

## 2020-08-19 DIAGNOSIS — R5381 Other malaise: Secondary | ICD-10-CM | POA: Diagnosis not present

## 2020-08-19 DIAGNOSIS — G20 Parkinson's disease: Secondary | ICD-10-CM | POA: Diagnosis not present

## 2020-08-19 DIAGNOSIS — I639 Cerebral infarction, unspecified: Secondary | ICD-10-CM | POA: Diagnosis not present

## 2020-08-21 DIAGNOSIS — I639 Cerebral infarction, unspecified: Secondary | ICD-10-CM | POA: Diagnosis not present

## 2020-08-21 DIAGNOSIS — G20 Parkinson's disease: Secondary | ICD-10-CM | POA: Diagnosis not present

## 2020-08-21 DIAGNOSIS — R5381 Other malaise: Secondary | ICD-10-CM | POA: Diagnosis not present

## 2020-08-22 DIAGNOSIS — G20 Parkinson's disease: Secondary | ICD-10-CM | POA: Diagnosis not present

## 2020-08-22 DIAGNOSIS — I639 Cerebral infarction, unspecified: Secondary | ICD-10-CM | POA: Diagnosis not present

## 2020-08-22 DIAGNOSIS — R5381 Other malaise: Secondary | ICD-10-CM | POA: Diagnosis not present

## 2020-08-23 DIAGNOSIS — I639 Cerebral infarction, unspecified: Secondary | ICD-10-CM | POA: Diagnosis not present

## 2020-08-23 DIAGNOSIS — G20 Parkinson's disease: Secondary | ICD-10-CM | POA: Diagnosis not present

## 2020-08-23 DIAGNOSIS — R5381 Other malaise: Secondary | ICD-10-CM | POA: Diagnosis not present

## 2020-08-24 DIAGNOSIS — R5381 Other malaise: Secondary | ICD-10-CM | POA: Diagnosis not present

## 2020-08-24 DIAGNOSIS — G20 Parkinson's disease: Secondary | ICD-10-CM | POA: Diagnosis not present

## 2020-08-24 DIAGNOSIS — I639 Cerebral infarction, unspecified: Secondary | ICD-10-CM | POA: Diagnosis not present

## 2020-08-26 DIAGNOSIS — R5381 Other malaise: Secondary | ICD-10-CM | POA: Diagnosis not present

## 2020-08-26 DIAGNOSIS — I639 Cerebral infarction, unspecified: Secondary | ICD-10-CM | POA: Diagnosis not present

## 2020-08-26 DIAGNOSIS — G20 Parkinson's disease: Secondary | ICD-10-CM | POA: Diagnosis not present

## 2020-08-28 DIAGNOSIS — G20 Parkinson's disease: Secondary | ICD-10-CM | POA: Diagnosis not present

## 2020-08-28 DIAGNOSIS — R5381 Other malaise: Secondary | ICD-10-CM | POA: Diagnosis not present

## 2020-08-28 DIAGNOSIS — I639 Cerebral infarction, unspecified: Secondary | ICD-10-CM | POA: Diagnosis not present

## 2020-08-29 DIAGNOSIS — G20 Parkinson's disease: Secondary | ICD-10-CM | POA: Diagnosis not present

## 2020-08-29 DIAGNOSIS — I639 Cerebral infarction, unspecified: Secondary | ICD-10-CM | POA: Diagnosis not present

## 2020-08-29 DIAGNOSIS — R5381 Other malaise: Secondary | ICD-10-CM | POA: Diagnosis not present

## 2020-08-30 DIAGNOSIS — G20 Parkinson's disease: Secondary | ICD-10-CM | POA: Diagnosis not present

## 2020-08-30 DIAGNOSIS — I639 Cerebral infarction, unspecified: Secondary | ICD-10-CM | POA: Diagnosis not present

## 2020-08-30 DIAGNOSIS — R5381 Other malaise: Secondary | ICD-10-CM | POA: Diagnosis not present

## 2020-08-31 ENCOUNTER — Other Ambulatory Visit: Payer: Self-pay | Admitting: Neurology

## 2020-08-31 DIAGNOSIS — R5381 Other malaise: Secondary | ICD-10-CM | POA: Diagnosis not present

## 2020-08-31 DIAGNOSIS — I639 Cerebral infarction, unspecified: Secondary | ICD-10-CM | POA: Diagnosis not present

## 2020-08-31 DIAGNOSIS — G20 Parkinson's disease: Secondary | ICD-10-CM | POA: Diagnosis not present

## 2020-09-01 DIAGNOSIS — G20 Parkinson's disease: Secondary | ICD-10-CM | POA: Diagnosis not present

## 2020-09-01 DIAGNOSIS — R5381 Other malaise: Secondary | ICD-10-CM | POA: Diagnosis not present

## 2020-09-01 DIAGNOSIS — I639 Cerebral infarction, unspecified: Secondary | ICD-10-CM | POA: Diagnosis not present

## 2020-09-02 DIAGNOSIS — R5381 Other malaise: Secondary | ICD-10-CM | POA: Diagnosis not present

## 2020-09-02 DIAGNOSIS — I639 Cerebral infarction, unspecified: Secondary | ICD-10-CM | POA: Diagnosis not present

## 2020-09-02 DIAGNOSIS — G20 Parkinson's disease: Secondary | ICD-10-CM | POA: Diagnosis not present

## 2020-09-04 DIAGNOSIS — I639 Cerebral infarction, unspecified: Secondary | ICD-10-CM | POA: Diagnosis not present

## 2020-09-04 DIAGNOSIS — G20 Parkinson's disease: Secondary | ICD-10-CM | POA: Diagnosis not present

## 2020-09-04 DIAGNOSIS — R5381 Other malaise: Secondary | ICD-10-CM | POA: Diagnosis not present

## 2020-09-06 DIAGNOSIS — R5381 Other malaise: Secondary | ICD-10-CM | POA: Diagnosis not present

## 2020-09-06 DIAGNOSIS — G20 Parkinson's disease: Secondary | ICD-10-CM | POA: Diagnosis not present

## 2020-09-06 DIAGNOSIS — I639 Cerebral infarction, unspecified: Secondary | ICD-10-CM | POA: Diagnosis not present

## 2020-09-07 DIAGNOSIS — G20 Parkinson's disease: Secondary | ICD-10-CM | POA: Diagnosis not present

## 2020-09-07 DIAGNOSIS — I639 Cerebral infarction, unspecified: Secondary | ICD-10-CM | POA: Diagnosis not present

## 2020-09-07 DIAGNOSIS — R5381 Other malaise: Secondary | ICD-10-CM | POA: Diagnosis not present

## 2020-09-08 DIAGNOSIS — R5381 Other malaise: Secondary | ICD-10-CM | POA: Diagnosis not present

## 2020-09-08 DIAGNOSIS — I639 Cerebral infarction, unspecified: Secondary | ICD-10-CM | POA: Diagnosis not present

## 2020-09-08 DIAGNOSIS — G20 Parkinson's disease: Secondary | ICD-10-CM | POA: Diagnosis not present

## 2020-09-09 DIAGNOSIS — I639 Cerebral infarction, unspecified: Secondary | ICD-10-CM | POA: Diagnosis not present

## 2020-09-09 DIAGNOSIS — G20 Parkinson's disease: Secondary | ICD-10-CM | POA: Diagnosis not present

## 2020-09-09 DIAGNOSIS — R5381 Other malaise: Secondary | ICD-10-CM | POA: Diagnosis not present

## 2020-09-11 DIAGNOSIS — I10 Essential (primary) hypertension: Secondary | ICD-10-CM | POA: Diagnosis not present

## 2020-09-11 DIAGNOSIS — I639 Cerebral infarction, unspecified: Secondary | ICD-10-CM | POA: Diagnosis not present

## 2020-09-11 DIAGNOSIS — R5381 Other malaise: Secondary | ICD-10-CM | POA: Diagnosis not present

## 2020-09-11 DIAGNOSIS — G20 Parkinson's disease: Secondary | ICD-10-CM | POA: Diagnosis not present

## 2020-09-12 DIAGNOSIS — G20 Parkinson's disease: Secondary | ICD-10-CM | POA: Diagnosis not present

## 2020-09-12 DIAGNOSIS — R5381 Other malaise: Secondary | ICD-10-CM | POA: Diagnosis not present

## 2020-09-12 DIAGNOSIS — I639 Cerebral infarction, unspecified: Secondary | ICD-10-CM | POA: Diagnosis not present

## 2020-09-13 DIAGNOSIS — R5381 Other malaise: Secondary | ICD-10-CM | POA: Diagnosis not present

## 2020-09-13 DIAGNOSIS — G20 Parkinson's disease: Secondary | ICD-10-CM | POA: Diagnosis not present

## 2020-09-13 DIAGNOSIS — I639 Cerebral infarction, unspecified: Secondary | ICD-10-CM | POA: Diagnosis not present

## 2020-09-14 DIAGNOSIS — I639 Cerebral infarction, unspecified: Secondary | ICD-10-CM | POA: Diagnosis not present

## 2020-09-14 DIAGNOSIS — R5381 Other malaise: Secondary | ICD-10-CM | POA: Diagnosis not present

## 2020-09-14 DIAGNOSIS — G20 Parkinson's disease: Secondary | ICD-10-CM | POA: Diagnosis not present

## 2020-09-15 DIAGNOSIS — I639 Cerebral infarction, unspecified: Secondary | ICD-10-CM | POA: Diagnosis not present

## 2020-09-15 DIAGNOSIS — R5381 Other malaise: Secondary | ICD-10-CM | POA: Diagnosis not present

## 2020-09-15 DIAGNOSIS — G20 Parkinson's disease: Secondary | ICD-10-CM | POA: Diagnosis not present

## 2020-09-16 DIAGNOSIS — G20 Parkinson's disease: Secondary | ICD-10-CM | POA: Diagnosis not present

## 2020-09-16 DIAGNOSIS — R5381 Other malaise: Secondary | ICD-10-CM | POA: Diagnosis not present

## 2020-09-16 DIAGNOSIS — I639 Cerebral infarction, unspecified: Secondary | ICD-10-CM | POA: Diagnosis not present

## 2020-09-18 DIAGNOSIS — R5381 Other malaise: Secondary | ICD-10-CM | POA: Diagnosis not present

## 2020-09-18 DIAGNOSIS — I639 Cerebral infarction, unspecified: Secondary | ICD-10-CM | POA: Diagnosis not present

## 2020-09-18 DIAGNOSIS — G20 Parkinson's disease: Secondary | ICD-10-CM | POA: Diagnosis not present

## 2020-09-19 DIAGNOSIS — G20 Parkinson's disease: Secondary | ICD-10-CM | POA: Diagnosis not present

## 2020-09-19 DIAGNOSIS — R5381 Other malaise: Secondary | ICD-10-CM | POA: Diagnosis not present

## 2020-09-19 DIAGNOSIS — I639 Cerebral infarction, unspecified: Secondary | ICD-10-CM | POA: Diagnosis not present

## 2020-09-20 DIAGNOSIS — I639 Cerebral infarction, unspecified: Secondary | ICD-10-CM | POA: Diagnosis not present

## 2020-09-20 DIAGNOSIS — G20 Parkinson's disease: Secondary | ICD-10-CM | POA: Diagnosis not present

## 2020-09-20 DIAGNOSIS — R5381 Other malaise: Secondary | ICD-10-CM | POA: Diagnosis not present

## 2020-09-21 DIAGNOSIS — R5381 Other malaise: Secondary | ICD-10-CM | POA: Diagnosis not present

## 2020-09-21 DIAGNOSIS — G20 Parkinson's disease: Secondary | ICD-10-CM | POA: Diagnosis not present

## 2020-09-21 DIAGNOSIS — I639 Cerebral infarction, unspecified: Secondary | ICD-10-CM | POA: Diagnosis not present

## 2020-09-22 DIAGNOSIS — G20 Parkinson's disease: Secondary | ICD-10-CM | POA: Diagnosis not present

## 2020-09-22 DIAGNOSIS — R5381 Other malaise: Secondary | ICD-10-CM | POA: Diagnosis not present

## 2020-09-22 DIAGNOSIS — I639 Cerebral infarction, unspecified: Secondary | ICD-10-CM | POA: Diagnosis not present

## 2020-09-23 DIAGNOSIS — R5381 Other malaise: Secondary | ICD-10-CM | POA: Diagnosis not present

## 2020-09-23 DIAGNOSIS — G20 Parkinson's disease: Secondary | ICD-10-CM | POA: Diagnosis not present

## 2020-09-23 DIAGNOSIS — I639 Cerebral infarction, unspecified: Secondary | ICD-10-CM | POA: Diagnosis not present

## 2020-09-25 DIAGNOSIS — I639 Cerebral infarction, unspecified: Secondary | ICD-10-CM | POA: Diagnosis not present

## 2020-09-25 DIAGNOSIS — M545 Low back pain, unspecified: Secondary | ICD-10-CM | POA: Diagnosis not present

## 2020-09-25 DIAGNOSIS — G20 Parkinson's disease: Secondary | ICD-10-CM | POA: Diagnosis not present

## 2020-09-25 DIAGNOSIS — R5381 Other malaise: Secondary | ICD-10-CM | POA: Diagnosis not present

## 2020-09-26 DIAGNOSIS — I639 Cerebral infarction, unspecified: Secondary | ICD-10-CM | POA: Diagnosis not present

## 2020-09-26 DIAGNOSIS — G20 Parkinson's disease: Secondary | ICD-10-CM | POA: Diagnosis not present

## 2020-09-26 DIAGNOSIS — R5381 Other malaise: Secondary | ICD-10-CM | POA: Diagnosis not present

## 2020-09-27 DIAGNOSIS — R5381 Other malaise: Secondary | ICD-10-CM | POA: Diagnosis not present

## 2020-09-27 DIAGNOSIS — I639 Cerebral infarction, unspecified: Secondary | ICD-10-CM | POA: Diagnosis not present

## 2020-09-27 DIAGNOSIS — G20 Parkinson's disease: Secondary | ICD-10-CM | POA: Diagnosis not present

## 2020-09-28 DIAGNOSIS — G20 Parkinson's disease: Secondary | ICD-10-CM | POA: Diagnosis not present

## 2020-09-28 DIAGNOSIS — R5381 Other malaise: Secondary | ICD-10-CM | POA: Diagnosis not present

## 2020-09-28 DIAGNOSIS — I639 Cerebral infarction, unspecified: Secondary | ICD-10-CM | POA: Diagnosis not present

## 2020-09-29 DIAGNOSIS — I639 Cerebral infarction, unspecified: Secondary | ICD-10-CM | POA: Diagnosis not present

## 2020-09-29 DIAGNOSIS — G20 Parkinson's disease: Secondary | ICD-10-CM | POA: Diagnosis not present

## 2020-10-02 DIAGNOSIS — G20 Parkinson's disease: Secondary | ICD-10-CM | POA: Diagnosis not present

## 2020-10-02 DIAGNOSIS — I639 Cerebral infarction, unspecified: Secondary | ICD-10-CM | POA: Diagnosis not present

## 2020-10-03 DIAGNOSIS — I639 Cerebral infarction, unspecified: Secondary | ICD-10-CM | POA: Diagnosis not present

## 2020-10-03 DIAGNOSIS — G20 Parkinson's disease: Secondary | ICD-10-CM | POA: Diagnosis not present

## 2020-10-04 DIAGNOSIS — I639 Cerebral infarction, unspecified: Secondary | ICD-10-CM | POA: Diagnosis not present

## 2020-10-04 DIAGNOSIS — R5381 Other malaise: Secondary | ICD-10-CM | POA: Diagnosis not present

## 2020-10-04 DIAGNOSIS — G20 Parkinson's disease: Secondary | ICD-10-CM | POA: Diagnosis not present

## 2020-10-05 DIAGNOSIS — G20 Parkinson's disease: Secondary | ICD-10-CM | POA: Diagnosis not present

## 2020-10-05 DIAGNOSIS — I639 Cerebral infarction, unspecified: Secondary | ICD-10-CM | POA: Diagnosis not present

## 2020-10-05 DIAGNOSIS — R5381 Other malaise: Secondary | ICD-10-CM | POA: Diagnosis not present

## 2020-10-09 DIAGNOSIS — I639 Cerebral infarction, unspecified: Secondary | ICD-10-CM | POA: Diagnosis not present

## 2020-10-09 DIAGNOSIS — R5381 Other malaise: Secondary | ICD-10-CM | POA: Diagnosis not present

## 2020-10-09 DIAGNOSIS — G20 Parkinson's disease: Secondary | ICD-10-CM | POA: Diagnosis not present

## 2020-10-10 DIAGNOSIS — G20 Parkinson's disease: Secondary | ICD-10-CM | POA: Diagnosis not present

## 2020-10-10 DIAGNOSIS — I639 Cerebral infarction, unspecified: Secondary | ICD-10-CM | POA: Diagnosis not present

## 2020-10-10 DIAGNOSIS — R5381 Other malaise: Secondary | ICD-10-CM | POA: Diagnosis not present

## 2020-10-11 DIAGNOSIS — I10 Essential (primary) hypertension: Secondary | ICD-10-CM | POA: Diagnosis not present

## 2020-10-11 DIAGNOSIS — I639 Cerebral infarction, unspecified: Secondary | ICD-10-CM | POA: Diagnosis not present

## 2020-10-11 DIAGNOSIS — R5381 Other malaise: Secondary | ICD-10-CM | POA: Diagnosis not present

## 2020-10-11 DIAGNOSIS — G20 Parkinson's disease: Secondary | ICD-10-CM | POA: Diagnosis not present

## 2020-10-12 DIAGNOSIS — G20 Parkinson's disease: Secondary | ICD-10-CM | POA: Diagnosis not present

## 2020-10-12 DIAGNOSIS — R5381 Other malaise: Secondary | ICD-10-CM | POA: Diagnosis not present

## 2020-10-12 DIAGNOSIS — I639 Cerebral infarction, unspecified: Secondary | ICD-10-CM | POA: Diagnosis not present

## 2020-10-13 DIAGNOSIS — R5381 Other malaise: Secondary | ICD-10-CM | POA: Diagnosis not present

## 2020-10-13 DIAGNOSIS — I639 Cerebral infarction, unspecified: Secondary | ICD-10-CM | POA: Diagnosis not present

## 2020-10-13 DIAGNOSIS — G20 Parkinson's disease: Secondary | ICD-10-CM | POA: Diagnosis not present

## 2020-10-14 DIAGNOSIS — R5381 Other malaise: Secondary | ICD-10-CM | POA: Diagnosis not present

## 2020-10-14 DIAGNOSIS — G20 Parkinson's disease: Secondary | ICD-10-CM | POA: Diagnosis not present

## 2020-10-14 DIAGNOSIS — I639 Cerebral infarction, unspecified: Secondary | ICD-10-CM | POA: Diagnosis not present

## 2020-10-16 DIAGNOSIS — I639 Cerebral infarction, unspecified: Secondary | ICD-10-CM | POA: Diagnosis not present

## 2020-10-16 DIAGNOSIS — R5381 Other malaise: Secondary | ICD-10-CM | POA: Diagnosis not present

## 2020-10-16 DIAGNOSIS — G20 Parkinson's disease: Secondary | ICD-10-CM | POA: Diagnosis not present

## 2020-10-17 DIAGNOSIS — I639 Cerebral infarction, unspecified: Secondary | ICD-10-CM | POA: Diagnosis not present

## 2020-10-17 DIAGNOSIS — R5381 Other malaise: Secondary | ICD-10-CM | POA: Diagnosis not present

## 2020-10-17 DIAGNOSIS — G20 Parkinson's disease: Secondary | ICD-10-CM | POA: Diagnosis not present

## 2020-10-18 DIAGNOSIS — R5381 Other malaise: Secondary | ICD-10-CM | POA: Diagnosis not present

## 2020-10-18 DIAGNOSIS — I639 Cerebral infarction, unspecified: Secondary | ICD-10-CM | POA: Diagnosis not present

## 2020-10-18 DIAGNOSIS — G20 Parkinson's disease: Secondary | ICD-10-CM | POA: Diagnosis not present

## 2020-10-19 DIAGNOSIS — G20 Parkinson's disease: Secondary | ICD-10-CM | POA: Diagnosis not present

## 2020-10-19 DIAGNOSIS — I639 Cerebral infarction, unspecified: Secondary | ICD-10-CM | POA: Diagnosis not present

## 2020-10-19 DIAGNOSIS — R5381 Other malaise: Secondary | ICD-10-CM | POA: Diagnosis not present

## 2020-10-20 DIAGNOSIS — G20 Parkinson's disease: Secondary | ICD-10-CM | POA: Diagnosis not present

## 2020-10-20 DIAGNOSIS — I639 Cerebral infarction, unspecified: Secondary | ICD-10-CM | POA: Diagnosis not present

## 2020-10-20 DIAGNOSIS — R5381 Other malaise: Secondary | ICD-10-CM | POA: Diagnosis not present

## 2020-10-21 DIAGNOSIS — G20 Parkinson's disease: Secondary | ICD-10-CM | POA: Diagnosis not present

## 2020-10-21 DIAGNOSIS — R5381 Other malaise: Secondary | ICD-10-CM | POA: Diagnosis not present

## 2020-10-21 DIAGNOSIS — I639 Cerebral infarction, unspecified: Secondary | ICD-10-CM | POA: Diagnosis not present

## 2020-10-23 DIAGNOSIS — I639 Cerebral infarction, unspecified: Secondary | ICD-10-CM | POA: Diagnosis not present

## 2020-10-23 DIAGNOSIS — G20 Parkinson's disease: Secondary | ICD-10-CM | POA: Diagnosis not present

## 2020-10-23 DIAGNOSIS — R5381 Other malaise: Secondary | ICD-10-CM | POA: Diagnosis not present

## 2020-10-24 DIAGNOSIS — I639 Cerebral infarction, unspecified: Secondary | ICD-10-CM | POA: Diagnosis not present

## 2020-10-24 DIAGNOSIS — R5381 Other malaise: Secondary | ICD-10-CM | POA: Diagnosis not present

## 2020-10-24 DIAGNOSIS — G20 Parkinson's disease: Secondary | ICD-10-CM | POA: Diagnosis not present

## 2020-10-25 DIAGNOSIS — R5381 Other malaise: Secondary | ICD-10-CM | POA: Diagnosis not present

## 2020-10-25 DIAGNOSIS — I639 Cerebral infarction, unspecified: Secondary | ICD-10-CM | POA: Diagnosis not present

## 2020-10-25 DIAGNOSIS — G20 Parkinson's disease: Secondary | ICD-10-CM | POA: Diagnosis not present

## 2020-10-26 DIAGNOSIS — R5381 Other malaise: Secondary | ICD-10-CM | POA: Diagnosis not present

## 2020-10-26 DIAGNOSIS — I639 Cerebral infarction, unspecified: Secondary | ICD-10-CM | POA: Diagnosis not present

## 2020-10-26 DIAGNOSIS — G20 Parkinson's disease: Secondary | ICD-10-CM | POA: Diagnosis not present

## 2020-10-27 ENCOUNTER — Other Ambulatory Visit: Payer: Self-pay | Admitting: Cardiology

## 2020-10-27 DIAGNOSIS — R5381 Other malaise: Secondary | ICD-10-CM | POA: Diagnosis not present

## 2020-10-27 DIAGNOSIS — G20 Parkinson's disease: Secondary | ICD-10-CM | POA: Diagnosis not present

## 2020-10-27 DIAGNOSIS — I639 Cerebral infarction, unspecified: Secondary | ICD-10-CM | POA: Diagnosis not present

## 2020-10-27 DIAGNOSIS — I10 Essential (primary) hypertension: Secondary | ICD-10-CM

## 2020-10-30 DIAGNOSIS — I639 Cerebral infarction, unspecified: Secondary | ICD-10-CM | POA: Diagnosis not present

## 2020-10-30 DIAGNOSIS — R5381 Other malaise: Secondary | ICD-10-CM | POA: Diagnosis not present

## 2020-10-30 DIAGNOSIS — G20 Parkinson's disease: Secondary | ICD-10-CM | POA: Diagnosis not present

## 2020-10-31 DIAGNOSIS — G20 Parkinson's disease: Secondary | ICD-10-CM | POA: Diagnosis not present

## 2020-10-31 DIAGNOSIS — R5381 Other malaise: Secondary | ICD-10-CM | POA: Diagnosis not present

## 2020-10-31 DIAGNOSIS — I639 Cerebral infarction, unspecified: Secondary | ICD-10-CM | POA: Diagnosis not present

## 2020-11-01 DIAGNOSIS — G20 Parkinson's disease: Secondary | ICD-10-CM | POA: Diagnosis not present

## 2020-11-01 DIAGNOSIS — I639 Cerebral infarction, unspecified: Secondary | ICD-10-CM | POA: Diagnosis not present

## 2020-11-01 DIAGNOSIS — R5381 Other malaise: Secondary | ICD-10-CM | POA: Diagnosis not present

## 2020-11-02 DIAGNOSIS — I639 Cerebral infarction, unspecified: Secondary | ICD-10-CM | POA: Diagnosis not present

## 2020-11-02 DIAGNOSIS — G20 Parkinson's disease: Secondary | ICD-10-CM | POA: Diagnosis not present

## 2020-11-02 DIAGNOSIS — R5381 Other malaise: Secondary | ICD-10-CM | POA: Diagnosis not present

## 2020-11-03 DIAGNOSIS — G20 Parkinson's disease: Secondary | ICD-10-CM | POA: Diagnosis not present

## 2020-11-03 DIAGNOSIS — I639 Cerebral infarction, unspecified: Secondary | ICD-10-CM | POA: Diagnosis not present

## 2020-11-03 DIAGNOSIS — R5381 Other malaise: Secondary | ICD-10-CM | POA: Diagnosis not present

## 2020-11-06 DIAGNOSIS — R5381 Other malaise: Secondary | ICD-10-CM | POA: Diagnosis not present

## 2020-11-06 DIAGNOSIS — I639 Cerebral infarction, unspecified: Secondary | ICD-10-CM | POA: Diagnosis not present

## 2020-11-06 DIAGNOSIS — G20 Parkinson's disease: Secondary | ICD-10-CM | POA: Diagnosis not present

## 2020-11-07 DIAGNOSIS — I639 Cerebral infarction, unspecified: Secondary | ICD-10-CM | POA: Diagnosis not present

## 2020-11-07 DIAGNOSIS — G20 Parkinson's disease: Secondary | ICD-10-CM | POA: Diagnosis not present

## 2020-11-07 DIAGNOSIS — R5381 Other malaise: Secondary | ICD-10-CM | POA: Diagnosis not present

## 2020-11-08 DIAGNOSIS — R5381 Other malaise: Secondary | ICD-10-CM | POA: Diagnosis not present

## 2020-11-08 DIAGNOSIS — G20 Parkinson's disease: Secondary | ICD-10-CM | POA: Diagnosis not present

## 2020-11-08 DIAGNOSIS — I639 Cerebral infarction, unspecified: Secondary | ICD-10-CM | POA: Diagnosis not present

## 2020-11-09 DIAGNOSIS — G20 Parkinson's disease: Secondary | ICD-10-CM | POA: Diagnosis not present

## 2020-11-09 DIAGNOSIS — I639 Cerebral infarction, unspecified: Secondary | ICD-10-CM | POA: Diagnosis not present

## 2020-11-09 DIAGNOSIS — R5381 Other malaise: Secondary | ICD-10-CM | POA: Diagnosis not present

## 2020-11-10 DIAGNOSIS — G20 Parkinson's disease: Secondary | ICD-10-CM | POA: Diagnosis not present

## 2020-11-10 DIAGNOSIS — R5381 Other malaise: Secondary | ICD-10-CM | POA: Diagnosis not present

## 2020-11-10 DIAGNOSIS — I639 Cerebral infarction, unspecified: Secondary | ICD-10-CM | POA: Diagnosis not present

## 2020-11-11 DIAGNOSIS — R5381 Other malaise: Secondary | ICD-10-CM | POA: Diagnosis not present

## 2020-11-11 DIAGNOSIS — G20 Parkinson's disease: Secondary | ICD-10-CM | POA: Diagnosis not present

## 2020-11-11 DIAGNOSIS — I10 Essential (primary) hypertension: Secondary | ICD-10-CM | POA: Diagnosis not present

## 2020-11-11 DIAGNOSIS — I639 Cerebral infarction, unspecified: Secondary | ICD-10-CM | POA: Diagnosis not present

## 2020-11-13 DIAGNOSIS — I639 Cerebral infarction, unspecified: Secondary | ICD-10-CM | POA: Diagnosis not present

## 2020-11-13 DIAGNOSIS — G20 Parkinson's disease: Secondary | ICD-10-CM | POA: Diagnosis not present

## 2020-11-13 DIAGNOSIS — R5381 Other malaise: Secondary | ICD-10-CM | POA: Diagnosis not present

## 2020-11-14 DIAGNOSIS — G20 Parkinson's disease: Secondary | ICD-10-CM | POA: Diagnosis not present

## 2020-11-14 DIAGNOSIS — I639 Cerebral infarction, unspecified: Secondary | ICD-10-CM | POA: Diagnosis not present

## 2020-11-14 DIAGNOSIS — R5381 Other malaise: Secondary | ICD-10-CM | POA: Diagnosis not present

## 2020-11-22 ENCOUNTER — Other Ambulatory Visit: Payer: Self-pay | Admitting: Pharmacist

## 2020-11-22 DIAGNOSIS — I10 Essential (primary) hypertension: Secondary | ICD-10-CM

## 2020-11-22 MED ORDER — VALSARTAN 40 MG PO TABS: 40.0000 mg | ORAL_TABLET | Freq: Every day | ORAL | 2 refills | Status: DC

## 2020-11-27 ENCOUNTER — Other Ambulatory Visit: Payer: Self-pay | Admitting: Neurology

## 2020-11-27 DIAGNOSIS — G20 Parkinson's disease: Secondary | ICD-10-CM

## 2020-12-07 ENCOUNTER — Other Ambulatory Visit: Payer: Self-pay | Admitting: Neurology

## 2020-12-09 DIAGNOSIS — G20 Parkinson's disease: Secondary | ICD-10-CM | POA: Diagnosis not present

## 2020-12-09 DIAGNOSIS — I639 Cerebral infarction, unspecified: Secondary | ICD-10-CM | POA: Diagnosis not present

## 2020-12-11 DIAGNOSIS — I10 Essential (primary) hypertension: Secondary | ICD-10-CM | POA: Diagnosis not present

## 2020-12-11 NOTE — Progress Notes (Signed)
Virtual Visit Via Video   The purpose of this virtual visit is to provide medical care while limiting exposure to the novel coronavirus.    Consent was obtained for video visit:  Yes.   Answered questions that patient had about telehealth interaction:  Yes.   I discussed the limitations, risks, security and privacy concerns of performing an evaluation and management service by telemedicine. I also discussed with the patient that there may be a patient responsible charge related to this service. The patient expressed understanding and agreed to proceed.  Pt location: Home Physician Location: office Name of referring provider:  [name], MD I connected with [name] at patients initiation/request on 12/13/2020 at 11:15 AM EST by video enabled telemedicine application and verified that I am speaking with the correct person using two identifiers. Pt MRN:  [id number] Pt DOB:  1934/10/17 Video Participants:  [name];  Daughter in law  Assessment/Plan:   1.  Parkinson's disease  -Continue carbidopa/levodopa 25/100, 2 tablets at 6 AM/1 tablet at 10 AM/2 tablets at 2 PM/2 tablet at 6 PM   -Patient is currently staying with other son/daughter-in-law.  They do not know her medicine well, but states that they have no carbidopa/levodopa 50/200 at their house.  I asked them to restart that at bedtime.   2.  MDD  -On mirtazapine, 15 mg daily  -She can try melatonin, 3 to 9 mg for sleep and RBD  3.  Probable embolic cerebral infarction  -On Crestor, 10 mg daily  -On aspirin, 81 mg daily, as unable to prove atrial fibrillation thus far  -Following with cardiology.  Patient/family declined loop recorder  4.  Hearing change, left ear  -Discussed with daughter-in-law that this is really out of my area of expertise, but she could follow-up with primary care.  My suspicion is that she likely has sensorineural hearing loss, but it is difficult to tell with just a video visit and the  fact that she does not speak my language.  Discussed audiology evaluation.  She does not need a referral for that.  5.  Discussed with patient that I really would like to see her in person next time.  It has been over 1 year since I have seen her at an in person visit.   Subjective   Patient seen in follow-up today for her Parkinson's disease.  No falls since last visit.  No lightheadedness or near syncope.  Noting that she did see cardiology since last visit.  I started her on valsartan, which caused lightheadedness and low blood pressure, so the dosage was reduced.  I thought she was off of it but she was   Current movement d/o meds:  Carbidopa/levodopa 25/100, 2 tablets at 6 AM/1 tablet at 10 AM/2 tablets at 2 PM/2 tablet at 6 PM  Carbidopa/levodopa 50/200 at bedtime (unclear if she is on it; family with her state that this particular medicine is not with them and she is staying with them right now but will be back in [place] next week) Mirtazapine, 15 mg daily Plavix, 75 mg daily Aspirin, 81 mg daily   Current Outpatient Medications on File Prior to Visit  Medication Sig Dispense Refill   acetaminophen (TYLENOL) 325 MG tablet Take 650 mg by mouth every 6 (six) hours as needed for mild pain.      alendronate (FOSAMAX) 70 MG tablet Take 70 mg by mouth every Wednesday.      aspirin EC 81 MG tablet  Take 81 mg by mouth daily. Swallow whole.     carbidopa-levodopa (SINEMET IR) 25-100 MG tablet 2 tablets at 6 AM/1 tablet at 10 AM/2 tablets at 2 PM/2 tablet at 6 PM 630 tablet 0   diclofenac Sodium (VOLTAREN) 1 % GEL Apply 4 g topically 4 (four) times daily.     furosemide (LASIX) 20 MG tablet Take 20 mg by mouth daily as needed for fluid.      mirtazapine (REMERON) 15 MG tablet TAKE 1 TABLET BY MOUTH EVERYDAY AT BEDTIME 90 tablet 1   omeprazole (PRILOSEC) 20 MG capsule Take 20 mg by mouth daily.     valsartan (DIOVAN) 40 MG tablet Take 1 tablet (40 mg total) by mouth daily. 90 tablet 2    carbidopa-levodopa (SINEMET CR) 50-200 MG tablet TAKE 1 TABLET BY MOUTH EVERYDAY AT BEDTIME (Patient not taking: Reported on 12/13/2020) 90 tablet 1   metoprolol tartrate (LOPRESSOR) 25 MG tablet Take 25 mg by mouth 2 (two) times daily. (Patient not taking: Reported on 12/13/2020)     rosuvastatin (CRESTOR) 10 MG tablet Take 1 tablet (10 mg total) by mouth daily. 30 tablet 3   traMADol (ULTRAM) 50 MG tablet Take 50 mg by mouth every 4 (four) hours as needed.     No current facility-administered medications on file prior to visit.     Objective   Vitals:   12/13/20 1102  Weight: 106 lb 9.6 oz (48.4 kg)    GEN:  The patient appears stated age and is in NAD.  Neurological examination:  Orientation: The patient is alert.   Cranial nerves: There is good facial symmetry. There is facial hypomimia.  Cannot assess fluency/clarity of speech - doesn't speak my language. Soft palate rises symmetrically and there is no tongue deviation. Hearing appears to be intact to conversational tone. Motor: Strength is at least antigravity x 4.   Shoulder shrug is equal and symmetric.  There is no pronator drift.  Movement examination: Tone: unable Abnormal movements: didn't see hands a rest via video Coordination: She had fairly good rapid alternating movements today in the upper extremities. Gait and Station: The patient ambulates fairly well with her walker in her home.    Follow up Instructions      -I discussed the assessment and treatment plan with the patient. The patient was provided an opportunity to ask questions and all were answered. The patient agreed with the plan and demonstrated an understanding of the instructions.   The patient was advised to call back or seek an in-person evaluation if the symptoms worsen or if the condition fails to improve as anticipated.     Katherine Bogus, DO

## 2020-12-13 ENCOUNTER — Telehealth: Payer: Self-pay | Admitting: Neurology

## 2020-12-13 ENCOUNTER — Other Ambulatory Visit: Payer: Self-pay

## 2020-12-13 ENCOUNTER — Telehealth (INDEPENDENT_AMBULATORY_CARE_PROVIDER_SITE_OTHER): Payer: Medicare Other | Admitting: Neurology

## 2020-12-13 VITALS — Wt 106.6 lb

## 2020-12-13 DIAGNOSIS — G20 Parkinson's disease: Secondary | ICD-10-CM

## 2020-12-13 NOTE — Telephone Encounter (Signed)
Called patient back in order to get her checked in for virtual appointment

## 2020-12-13 NOTE — Telephone Encounter (Signed)
Pt's family member called [place] after the pt got a call. She speaks english. Looks like the call was to go over the patient's chart before the virtual visit.

## 2021-01-09 DIAGNOSIS — G20 Parkinson's disease: Secondary | ICD-10-CM | POA: Diagnosis not present

## 2021-01-09 DIAGNOSIS — I639 Cerebral infarction, unspecified: Secondary | ICD-10-CM | POA: Diagnosis not present

## 2021-01-11 DIAGNOSIS — I10 Essential (primary) hypertension: Secondary | ICD-10-CM | POA: Diagnosis not present

## 2021-01-16 ENCOUNTER — Other Ambulatory Visit: Payer: Self-pay | Admitting: Neurology

## 2021-01-16 DIAGNOSIS — G20 Parkinson's disease: Secondary | ICD-10-CM

## 2021-01-16 DIAGNOSIS — G20A1 Parkinson's disease without dyskinesia, without mention of fluctuations: Secondary | ICD-10-CM

## 2021-01-29 DIAGNOSIS — E1169 Type 2 diabetes mellitus with other specified complication: Secondary | ICD-10-CM | POA: Diagnosis not present

## 2021-01-29 DIAGNOSIS — Z8673 Personal history of transient ischemic attack (TIA), and cerebral infarction without residual deficits: Secondary | ICD-10-CM | POA: Diagnosis not present

## 2021-01-29 DIAGNOSIS — D649 Anemia, unspecified: Secondary | ICD-10-CM | POA: Diagnosis not present

## 2021-01-29 DIAGNOSIS — G20 Parkinson's disease: Secondary | ICD-10-CM | POA: Diagnosis not present

## 2021-01-29 DIAGNOSIS — R54 Age-related physical debility: Secondary | ICD-10-CM | POA: Diagnosis not present

## 2021-01-29 DIAGNOSIS — K2270 Barrett's esophagus without dysplasia: Secondary | ICD-10-CM | POA: Diagnosis not present

## 2021-01-29 DIAGNOSIS — R112 Nausea with vomiting, unspecified: Secondary | ICD-10-CM | POA: Diagnosis not present

## 2021-01-29 DIAGNOSIS — Z79899 Other long term (current) drug therapy: Secondary | ICD-10-CM | POA: Diagnosis not present

## 2021-01-29 DIAGNOSIS — I10 Essential (primary) hypertension: Secondary | ICD-10-CM | POA: Diagnosis not present

## 2021-01-29 DIAGNOSIS — N390 Urinary tract infection, site not specified: Secondary | ICD-10-CM | POA: Diagnosis not present

## 2021-01-31 DIAGNOSIS — E1169 Type 2 diabetes mellitus with other specified complication: Secondary | ICD-10-CM | POA: Diagnosis not present

## 2021-01-31 DIAGNOSIS — R112 Nausea with vomiting, unspecified: Secondary | ICD-10-CM | POA: Diagnosis not present

## 2021-01-31 DIAGNOSIS — N390 Urinary tract infection, site not specified: Secondary | ICD-10-CM | POA: Diagnosis not present

## 2021-02-02 ENCOUNTER — Other Ambulatory Visit: Payer: Self-pay | Admitting: Family Medicine

## 2021-02-02 DIAGNOSIS — E2839 Other primary ovarian failure: Secondary | ICD-10-CM

## 2021-02-06 ENCOUNTER — Telehealth: Payer: Self-pay | Admitting: Neurology

## 2021-02-06 NOTE — Telephone Encounter (Signed)
Pt's son called in and left a message with the access nurse. The pt has been having a headache and has been having them more often.

## 2021-02-06 NOTE — Telephone Encounter (Signed)
How frequent or the headaches (on average, how many days a week/month are they occurring)?  Everyday on/off How long do the headaches last?  2-3 hours Verify what preventative medication and dose you are taking (e.g. topiramate, propranolol, amitriptyline, Emgality, etc)  none taken Verify which rescue medication you are taking (triptan, Advil, Excedrin, Aleve, Ubrelvy, etc)  Tylenol How often are you taking pain relievers/analgesics/rescue mediction?  Several times a day  I spoke to patients son. Whenever patient is having a headache he has been monitoring the blood pressure . Patient currently taking Valsartan and has been monitored by Cardiologist. Blood pressure usually around 130/80 but a couple of times it has risen to 150/90or 160/90 Patient has been having these headaches for around 3 months  Patient currently has a UTI and is taking antibiotics Patients son not sure if this is a side effect of Parkinson's

## 2021-02-07 DIAGNOSIS — E538 Deficiency of other specified B group vitamins: Secondary | ICD-10-CM | POA: Diagnosis not present

## 2021-02-07 NOTE — Telephone Encounter (Signed)
Patients son had called and left voicemail. I called him back

## 2021-02-07 NOTE — Telephone Encounter (Signed)
Called patients son and Left Voicemail message to cal lour office back

## 2021-02-07 NOTE — Telephone Encounter (Signed)
Patient's son called back.

## 2021-02-09 NOTE — Telephone Encounter (Signed)
Called patients son back left voicemail message

## 2021-02-10 ENCOUNTER — Encounter: Payer: Self-pay | Admitting: Student

## 2021-02-10 ENCOUNTER — Other Ambulatory Visit: Payer: Self-pay | Admitting: Student

## 2021-02-10 MED ORDER — METOPROLOL TARTRATE 25 MG PO TABS: 25.0000 mg | ORAL_TABLET | Freq: Two times a day (BID) | ORAL | 0 refills | Status: DC

## 2021-02-11 DIAGNOSIS — I10 Essential (primary) hypertension: Secondary | ICD-10-CM | POA: Diagnosis not present

## 2021-02-12 ENCOUNTER — Telehealth: Payer: Self-pay | Admitting: Student

## 2021-02-12 DIAGNOSIS — R54 Age-related physical debility: Secondary | ICD-10-CM | POA: Diagnosis not present

## 2021-02-12 DIAGNOSIS — K2270 Barrett's esophagus without dysplasia: Secondary | ICD-10-CM | POA: Diagnosis not present

## 2021-02-12 DIAGNOSIS — I10 Essential (primary) hypertension: Secondary | ICD-10-CM

## 2021-02-12 DIAGNOSIS — R112 Nausea with vomiting, unspecified: Secondary | ICD-10-CM | POA: Diagnosis not present

## 2021-02-12 DIAGNOSIS — Z8673 Personal history of transient ischemic attack (TIA), and cerebral infarction without residual deficits: Secondary | ICD-10-CM | POA: Diagnosis not present

## 2021-02-12 DIAGNOSIS — G20 Parkinson's disease: Secondary | ICD-10-CM | POA: Diagnosis not present

## 2021-02-12 NOTE — Telephone Encounter (Signed)
ON-CALL CARDIOLOGY 02/10/2021  Patient's name: Katherine Novak.   MRN: [id number].    DOB: 1934-10-04 Primary care provider: [name], MD. Primary cardiologist: Dr. Jorge Ny regarding this patient's care today: Patient's son called with concerns that patient has been having intermittent headache over the last 2 weeks.  Each time he checks her blood pressure during these episodes it is elevated, as high as 170/102 mmHg.  Patient is presently feeling well without headache and most recent blood pressure check was 129/72 mmHg.  Patient has been taking valsartan 40 mg daily.  She also has previously been prescribed Lopressor as well, but was recently advised to take it as needed.  Patient has not taken Lopressor at all recently.  Most recent heart rate value checked was 90 bpm.  Impression:   ICD-10-CM   1. Primary hypertension  I10       No orders of the defined types were placed in this encounter.   No orders of the defined types were placed in this encounter.   Recommendations: Patient is enrolled in remote patient monitoring with our office.  We will therefore have clinical pharmacist Hilton Sinclair follow-up to review blood pressure readings and medications.  I have refilled Lopressor as previously prescribed and advised that patient take 25 mg if her blood pressure is >150/90 mmHg.  Patient's son verbalized understanding and agreement.  Telephone encounter total time: 7 minutes     Alethia Berthold, PA-C 02/12/2021, 8:56 AM Office: [phone]

## 2021-02-16 NOTE — Telephone Encounter (Signed)
Called and reviewed readings with pt's son. Pt noted to have labile BP readings recently. Currently taking valsartan 40 mg daily and using metoprolol PRN if SBP >150 mmhg. Scheduled follow up OV with Dr. Virgina Jock to review causes for labile BP readings.

## 2021-02-22 ENCOUNTER — Telehealth: Payer: Self-pay | Admitting: Neurology

## 2021-02-22 ENCOUNTER — Telehealth: Payer: Self-pay | Admitting: Pharmacist

## 2021-02-22 NOTE — Telephone Encounter (Signed)
Patients son called to speak with chelsea. Michela Pitcher they have been talking back and forth. He wanted to leave a message on her V, which I did.

## 2021-02-22 NOTE — Telephone Encounter (Signed)
Pt's son called in returning Chelsea's call

## 2021-02-22 NOTE — Telephone Encounter (Signed)
Called patients son back

## 2021-02-23 MED ORDER — HYDRALAZINE HCL 25 MG PO TABS: 25.0000 mg | ORAL_TABLET | Freq: Three times a day (TID) | ORAL | 1 refills | Status: DC | PRN

## 2021-02-23 NOTE — Telephone Encounter (Signed)
Agree  Thanks MJP  

## 2021-02-23 NOTE — Telephone Encounter (Signed)
Patient returned call to Chelsea. 

## 2021-02-24 ENCOUNTER — Other Ambulatory Visit: Payer: Self-pay | Admitting: Neurology

## 2021-02-24 DIAGNOSIS — G20 Parkinson's disease: Secondary | ICD-10-CM

## 2021-02-26 ENCOUNTER — Other Ambulatory Visit: Payer: Self-pay

## 2021-02-26 NOTE — Telephone Encounter (Signed)
CALLED PATIENTS SON BACK THIS MORNING HE IS IN MEETING AND WILL RETURN MY CALL

## 2021-03-02 ENCOUNTER — Telehealth: Payer: Medicare Other | Admitting: Cardiology

## 2021-03-05 ENCOUNTER — Encounter: Payer: Self-pay | Admitting: Cardiology

## 2021-03-05 ENCOUNTER — Ambulatory Visit: Payer: Medicare Other | Admitting: Cardiology

## 2021-03-05 VITALS — BP 126/77 | HR 74

## 2021-03-05 DIAGNOSIS — Z8673 Personal history of transient ischemic attack (TIA), and cerebral infarction without residual deficits: Secondary | ICD-10-CM

## 2021-03-05 DIAGNOSIS — I10 Essential (primary) hypertension: Secondary | ICD-10-CM | POA: Diagnosis not present

## 2021-03-05 DIAGNOSIS — R002 Palpitations: Secondary | ICD-10-CM | POA: Diagnosis not present

## 2021-03-05 NOTE — Progress Notes (Signed)
Patient referred by [name], MD for stroke  Subjective:   Katherine Novak, female    DOB: May 03, 1934, 87 y.o.   MRN: [id number]  I connected with the patient on 03/05/2021 by a telephone call and verified that I am speaking with the correct person using two identifiers.     I offered the patient a video enabled application for a virtual visit. Unfortunately, this could not be accomplished due to technical difficulties/lack of video enabled phone/computer. I discussed the limitations of evaluation and management by telemedicine and the availability of in person appointments. The patient expressed understanding and agreed to proceed.   This visit type was conducted due to national recommendations for restrictions regarding the COVID-19 Pandemic (e.g. social distancing).  This format is felt to be most appropriate for this patient at this time.  All issues noted in this document were discussed and addressed.  No physical exam was performed (except for noted visual exam findings with Tele health visits).  The patient has consented to conduct a Tele health visit and understands insurance will be billed.   Chief Complaint  Patient presents with   Follow-up    6 months   bp     HPI  87 year old [place] female with hypertension, Parkinson's disease, h/o cerebral infarction, possibly embolic in etiology.  I spoke with patient's son Ninfa Giannelli. Patient's BP had been ranging 150s-160s/100s earlier this month. At that time, Valsartan was increased from 40 mg to 60 mg. Since then avg BP has improved to 138/81 mmHg. She has occasional low SBP in 90s, is when they hold her valsartan. She also has hydralazine for prn use for high BP in spite of taking Valsartan. She has had headaches with high BP readings. Headaches have improved, but not resolved after improvement in BP.  Current Outpatient Medications on File Prior to Visit  Medication Sig Dispense Refill   acetaminophen (TYLENOL)  500 MG tablet Take 1 tablet by mouth as needed.     alendronate (FOSAMAX) 70 MG tablet Take 70 mg by mouth every Wednesday.      aspirin EC 81 MG tablet Take 81 mg by mouth daily. Swallow whole.     carbidopa-levodopa (SINEMET CR) 50-200 MG tablet TAKE 1 TABLET BY MOUTH EVERYDAY AT BEDTIME 90 tablet 1   carbidopa-levodopa (SINEMET IR) 25-100 MG tablet TAKE 2 TABLETS AT 6 AM, 1 TABLET AT 10 AM, 2 TABLETS AT 2 PM, 2 TABLETS AT 6 PM 630 tablet 0   diclofenac Sodium (VOLTAREN) 1 % GEL Apply 4 g topically 4 (four) times daily.     furosemide (LASIX) 20 MG tablet Take 20 mg by mouth daily as needed for fluid.      hydrALAZINE (APRESOLINE) 25 MG tablet Take 1 tablet (25 mg total) by mouth 3 (three) times daily as needed (SBP > 160 mmhg). 30 tablet 1   metoprolol tartrate (LOPRESSOR) 25 MG tablet Take 1 tablet (25 mg total) by mouth 2 (two) times daily. (Patient taking differently: Take 25 mg by mouth as needed.) 60 tablet 0   mirtazapine (REMERON) 15 MG tablet TAKE 1 TABLET BY MOUTH EVERYDAY AT BEDTIME 90 tablet 1   pantoprazole (PROTONIX) 40 MG tablet Take 40 mg by mouth daily.     rosuvastatin (CRESTOR) 10 MG tablet Take 1 tablet (10 mg total) by mouth daily. 30 tablet 3   valsartan (DIOVAN) 40 MG tablet Take 1 tablet (40 mg total) by mouth daily. (Patient taking differently: Take 60  mg by mouth daily.) 90 tablet 2   No current facility-administered medications on file prior to visit.    Cardiovascular and other pertinent studies:  EKG 06/20/2020: Sinus rhythm 67 bpm  Normal EKG  Echocardiogram 12/10/2019:  1. Left ventricular ejection fraction, by estimation, is 60 to 65%. The  left ventricle has normal function. The left ventricle has no regional  wall motion abnormalities. Left ventricular diastolic parameters are  consistent with Grade I diastolic  dysfunction (impaired relaxation).   2. Right ventricular systolic function is normal. The right ventricular  size is normal.   3. The mitral  valve is normal in structure. Trivial mitral valve  regurgitation.   4. The aortic valve is normal in structure. Aortic valve regurgitation is  mild.   Brain MRI/MRA 12/2019: 1. Small focus of acute ischemia within the left frontal white matter. No hemorrhage or mass effect. 2. No emergent large vessel occlusion or high-grade stenosis in the head or neck.     Recent labs: 07/21/2020: Glucose 96, BUN/Cr 14/0.79. EGFR 74. Na/K 142/4.7.   12/09/2019: Glucose 103, BUN/Cr 8/0.68. EGFR >60. Na/K 143/3.8. >60. Rest of the CMP normal H/H 10.8/36.7. MCV 86. Platelets 168 HbA1C 6.3% Chol 196, TG 121, HDL 60, LDL 112 TSH N/A    Review of Systems  Cardiovascular:  Negative for chest pain, dyspnea on exertion, leg swelling, palpitations and syncope.  Musculoskeletal:  Positive for falls.        Vitals:   Today's Vitals    Vitals with BMI 03/02/2021 12/13/2020 08/02/2020  Height - - 4' 11"   Weight - 106 lbs 10 oz 114 lbs  BMI - - 53.96  Systolic 728 - 99  Diastolic 77 - 66  Pulse 74 - 85     Body mass index is 21.53 kg/m. There were no vitals filed for this visit.    Objective:   Physical Exam Not performed. Telephone visit     Assessment & Recommendations:   87 year old [place] female with hypertension, Parkinson's disease, h/o cerebral infarction, possibly embolic in etiology  Primary hypertension: Continue Valsartan 1.5 tabs of 40 mg daily. Okay to hold valsartan if SBP <100 mmHg. Use hydralazine for prn use for SBP >150 mmHg. F/u w/PCP or Neurology if headaches persists in spite of well controlled blood pressure.   Stroke: Suspected cardioembolic etiology.  No significant residual deficit. As previously described, patient and family have opted to hold off loop recorder placement, unless there is any recurrent TIA/stroke. In absence of documented A. fib, decided not to start anticoagulation at this time.  Continue aspirin 81 mg daily.  F/u in 6  months    Nigel Mormon, MD Pager: [phone] Office: [phone]

## 2021-03-06 ENCOUNTER — Encounter: Payer: Self-pay | Admitting: Cardiology

## 2021-03-12 ENCOUNTER — Other Ambulatory Visit: Payer: Self-pay | Admitting: Student

## 2021-03-13 DIAGNOSIS — I10 Essential (primary) hypertension: Secondary | ICD-10-CM | POA: Diagnosis not present

## 2021-04-07 DIAGNOSIS — G20 Parkinson's disease: Secondary | ICD-10-CM | POA: Diagnosis not present

## 2021-04-07 DIAGNOSIS — R54 Age-related physical debility: Secondary | ICD-10-CM | POA: Diagnosis not present

## 2021-04-07 DIAGNOSIS — K2270 Barrett's esophagus without dysplasia: Secondary | ICD-10-CM | POA: Diagnosis not present

## 2021-04-07 DIAGNOSIS — R112 Nausea with vomiting, unspecified: Secondary | ICD-10-CM | POA: Diagnosis not present

## 2021-04-07 DIAGNOSIS — Z8673 Personal history of transient ischemic attack (TIA), and cerebral infarction without residual deficits: Secondary | ICD-10-CM | POA: Diagnosis not present

## 2021-04-13 DIAGNOSIS — I10 Essential (primary) hypertension: Secondary | ICD-10-CM | POA: Diagnosis not present

## 2021-04-15 ENCOUNTER — Other Ambulatory Visit: Payer: Self-pay | Admitting: Student

## 2021-04-23 DIAGNOSIS — I714 Abdominal aortic aneurysm, without rupture, unspecified: Secondary | ICD-10-CM | POA: Diagnosis not present

## 2021-04-23 DIAGNOSIS — E1169 Type 2 diabetes mellitus with other specified complication: Secondary | ICD-10-CM | POA: Diagnosis not present

## 2021-04-23 DIAGNOSIS — Z0000 Encounter for general adult medical examination without abnormal findings: Secondary | ICD-10-CM | POA: Diagnosis not present

## 2021-04-23 DIAGNOSIS — R54 Age-related physical debility: Secondary | ICD-10-CM | POA: Diagnosis not present

## 2021-04-23 DIAGNOSIS — E538 Deficiency of other specified B group vitamins: Secondary | ICD-10-CM | POA: Diagnosis not present

## 2021-04-23 DIAGNOSIS — E042 Nontoxic multinodular goiter: Secondary | ICD-10-CM | POA: Diagnosis not present

## 2021-04-23 DIAGNOSIS — I10 Essential (primary) hypertension: Secondary | ICD-10-CM | POA: Diagnosis not present

## 2021-04-23 DIAGNOSIS — G20 Parkinson's disease: Secondary | ICD-10-CM | POA: Diagnosis not present

## 2021-04-23 DIAGNOSIS — Z8673 Personal history of transient ischemic attack (TIA), and cerebral infarction without residual deficits: Secondary | ICD-10-CM | POA: Diagnosis not present

## 2021-04-23 DIAGNOSIS — C9110 Chronic lymphocytic leukemia of B-cell type not having achieved remission: Secondary | ICD-10-CM | POA: Diagnosis not present

## 2021-04-23 DIAGNOSIS — E559 Vitamin D deficiency, unspecified: Secondary | ICD-10-CM | POA: Diagnosis not present

## 2021-04-24 DIAGNOSIS — R519 Headache, unspecified: Secondary | ICD-10-CM | POA: Diagnosis not present

## 2021-04-24 DIAGNOSIS — E1169 Type 2 diabetes mellitus with other specified complication: Secondary | ICD-10-CM | POA: Diagnosis not present

## 2021-05-12 DIAGNOSIS — G20 Parkinson's disease: Secondary | ICD-10-CM | POA: Diagnosis not present

## 2021-05-12 DIAGNOSIS — K2270 Barrett's esophagus without dysplasia: Secondary | ICD-10-CM | POA: Diagnosis not present

## 2021-05-12 DIAGNOSIS — R54 Age-related physical debility: Secondary | ICD-10-CM | POA: Diagnosis not present

## 2021-05-12 DIAGNOSIS — R112 Nausea with vomiting, unspecified: Secondary | ICD-10-CM | POA: Diagnosis not present

## 2021-05-12 DIAGNOSIS — Z8673 Personal history of transient ischemic attack (TIA), and cerebral infarction without residual deficits: Secondary | ICD-10-CM | POA: Diagnosis not present

## 2021-05-13 DIAGNOSIS — I10 Essential (primary) hypertension: Secondary | ICD-10-CM | POA: Diagnosis not present

## 2021-05-16 ENCOUNTER — Other Ambulatory Visit: Payer: Self-pay | Admitting: Student

## 2021-05-18 ENCOUNTER — Telehealth: Payer: Self-pay | Admitting: Neurology

## 2021-05-18 NOTE — Telephone Encounter (Signed)
Patient's son Nelly Rout called to see if Dr. Carles Collet has any recommendations for the patient, she is continuing to have problems with headaches. ? ?He is aware Dr. Carles Collet has left for the day today. ?

## 2021-05-22 NOTE — Telephone Encounter (Signed)
Called patients son back and left detailed message told him to call me back with any further questions  ?

## 2021-05-25 ENCOUNTER — Other Ambulatory Visit: Payer: Self-pay | Admitting: Neurology

## 2021-05-25 DIAGNOSIS — G20 Parkinson's disease: Secondary | ICD-10-CM

## 2021-06-12 DIAGNOSIS — R112 Nausea with vomiting, unspecified: Secondary | ICD-10-CM | POA: Diagnosis not present

## 2021-06-12 DIAGNOSIS — G20 Parkinson's disease: Secondary | ICD-10-CM | POA: Diagnosis not present

## 2021-06-12 DIAGNOSIS — K2270 Barrett's esophagus without dysplasia: Secondary | ICD-10-CM | POA: Diagnosis not present

## 2021-06-12 DIAGNOSIS — Z8673 Personal history of transient ischemic attack (TIA), and cerebral infarction without residual deficits: Secondary | ICD-10-CM | POA: Diagnosis not present

## 2021-06-12 DIAGNOSIS — R54 Age-related physical debility: Secondary | ICD-10-CM | POA: Diagnosis not present

## 2021-06-13 DIAGNOSIS — I10 Essential (primary) hypertension: Secondary | ICD-10-CM | POA: Diagnosis not present

## 2021-06-16 ENCOUNTER — Other Ambulatory Visit: Payer: Self-pay | Admitting: Student

## 2021-06-18 NOTE — Telephone Encounter (Signed)
Note that pts 6/15 appt was cancelled

## 2021-06-21 ENCOUNTER — Ambulatory Visit: Payer: Medicare Other | Admitting: Neurology

## 2021-06-26 ENCOUNTER — Ambulatory Visit: Payer: Medicare Other | Admitting: Neurology

## 2021-06-29 DIAGNOSIS — M533 Sacrococcygeal disorders, not elsewhere classified: Secondary | ICD-10-CM | POA: Diagnosis not present

## 2021-06-29 DIAGNOSIS — M545 Low back pain, unspecified: Secondary | ICD-10-CM | POA: Diagnosis not present

## 2021-07-02 ENCOUNTER — Encounter: Payer: Self-pay | Admitting: Neurology

## 2021-07-02 ENCOUNTER — Ambulatory Visit (INDEPENDENT_AMBULATORY_CARE_PROVIDER_SITE_OTHER): Payer: Medicare Other | Admitting: Neurology

## 2021-07-02 ENCOUNTER — Other Ambulatory Visit: Payer: Self-pay | Admitting: Cardiology

## 2021-07-02 VITALS — BP 120/68 | HR 76 | Ht 59.0 in | Wt 103.0 lb

## 2021-07-02 DIAGNOSIS — G4452 New daily persistent headache (NDPH): Secondary | ICD-10-CM | POA: Diagnosis not present

## 2021-07-02 DIAGNOSIS — I10 Essential (primary) hypertension: Secondary | ICD-10-CM

## 2021-07-02 DIAGNOSIS — G20 Parkinson's disease: Secondary | ICD-10-CM

## 2021-07-02 DIAGNOSIS — E538 Deficiency of other specified B group vitamins: Secondary | ICD-10-CM

## 2021-07-02 MED ORDER — CARBIDOPA-LEVODOPA ER 50-200 MG PO TBCR: 1.0000 | EXTENDED_RELEASE_TABLET | Freq: Every day | ORAL | 1 refills | Status: DC

## 2021-07-02 MED ORDER — CARBIDOPA-LEVODOPA 25-100 MG PO TABS: 2.0000 | ORAL_TABLET | Freq: Four times a day (QID) | ORAL | 1 refills | Status: DC

## 2021-07-02 MED ORDER — TRAZODONE HCL 50 MG PO TABS: ORAL_TABLET | ORAL | 1 refills | Status: DC

## 2021-07-03 ENCOUNTER — Telehealth: Payer: Self-pay

## 2021-07-03 NOTE — Telephone Encounter (Signed)
Patient son called about a refill on valsartan, but has already been taken care of.

## 2021-07-12 DIAGNOSIS — K2270 Barrett's esophagus without dysplasia: Secondary | ICD-10-CM | POA: Diagnosis not present

## 2021-07-12 DIAGNOSIS — Z8673 Personal history of transient ischemic attack (TIA), and cerebral infarction without residual deficits: Secondary | ICD-10-CM | POA: Diagnosis not present

## 2021-07-12 DIAGNOSIS — R112 Nausea with vomiting, unspecified: Secondary | ICD-10-CM | POA: Diagnosis not present

## 2021-07-12 DIAGNOSIS — R54 Age-related physical debility: Secondary | ICD-10-CM | POA: Diagnosis not present

## 2021-07-12 DIAGNOSIS — G20 Parkinson's disease: Secondary | ICD-10-CM | POA: Diagnosis not present

## 2021-07-13 DIAGNOSIS — I10 Essential (primary) hypertension: Secondary | ICD-10-CM | POA: Diagnosis not present

## 2021-07-21 ENCOUNTER — Other Ambulatory Visit: Payer: Self-pay | Admitting: Cardiology

## 2021-07-27 ENCOUNTER — Ambulatory Visit
Admission: RE | Admit: 2021-07-27 | Discharge: 2021-07-27 | Disposition: A | Discharge status: Home / Self Care | Payer: Medicare Other | Source: Ambulatory Visit | Attending: Neurology | Admitting: Neurology

## 2021-07-27 DIAGNOSIS — R519 Headache, unspecified: Secondary | ICD-10-CM | POA: Diagnosis not present

## 2021-07-27 DIAGNOSIS — G4452 New daily persistent headache (NDPH): Secondary | ICD-10-CM

## 2021-08-01 DIAGNOSIS — M545 Low back pain, unspecified: Secondary | ICD-10-CM | POA: Diagnosis not present

## 2021-08-03 ENCOUNTER — Ambulatory Visit: Payer: Medicare Other | Admitting: Neurology

## 2021-08-03 ENCOUNTER — Telehealth: Payer: Self-pay

## 2021-08-03 DIAGNOSIS — M545 Low back pain, unspecified: Secondary | ICD-10-CM | POA: Diagnosis not present

## 2021-08-03 NOTE — Telephone Encounter (Signed)
Discussed with Haynes Dage, RPH. Agree with the plan.  Nigel Mormon, MD

## 2021-08-03 NOTE — Telephone Encounter (Signed)
Consulted Dr. Virgina Jock today for management of patient's blood pressure medications. Patient has had some very low BP readings (SBP <90) via RPM. She was taking Valsartan '40mg'$  take 1.5 tablets daily and holding if SBP <100. Medication was changed to Valsartan '40mg'$  take 0.5 tablets daily. I left a VM for patient's son with change of instructions and will follow-up to make sure he got the message. Will continue to closely monitor the patient over the next 1-2 weeks to evaluate change of medication on BP readings.

## 2021-08-12 DIAGNOSIS — K2270 Barrett's esophagus without dysplasia: Secondary | ICD-10-CM | POA: Diagnosis not present

## 2021-08-12 DIAGNOSIS — G20 Parkinson's disease: Secondary | ICD-10-CM | POA: Diagnosis not present

## 2021-08-12 DIAGNOSIS — R112 Nausea with vomiting, unspecified: Secondary | ICD-10-CM | POA: Diagnosis not present

## 2021-08-12 DIAGNOSIS — Z8673 Personal history of transient ischemic attack (TIA), and cerebral infarction without residual deficits: Secondary | ICD-10-CM | POA: Diagnosis not present

## 2021-08-12 DIAGNOSIS — R54 Age-related physical debility: Secondary | ICD-10-CM | POA: Diagnosis not present

## 2021-08-13 DIAGNOSIS — I10 Essential (primary) hypertension: Secondary | ICD-10-CM | POA: Diagnosis not present

## 2021-08-15 ENCOUNTER — Inpatient Hospital Stay: Admission: RE | Admit: 2021-08-15 | Payer: Medicare Other | Source: Ambulatory Visit

## 2021-08-15 ENCOUNTER — Telehealth: Payer: Self-pay

## 2021-08-15 NOTE — Telephone Encounter (Signed)
Agree  Thanks MJP  

## 2021-08-15 NOTE — Telephone Encounter (Signed)
Patient was having labile BP's swinging from SBP 80-90 to SBP 130-150. Valsartan '40mg'$  take 1.5 tab daily ('60mg'$  total) was changed to Valsartan '40mg'$  take 0.5 tab daily ('20mg'$  total) about a week ago. Following up today on BP readings after med change. Labile BP swings have stopped but readings are running a little high.  Per Dr. Virgina Jock, will increase Valsartan to '40mg'$  1 tab daily to better manage HTN. Will communicate change to patient. Med list for HTN meds is accurate.  08/14/2021 Tuesday at 09:00 AM   136 / 80  08/13/2021 Monday at 02:40 PM    122 / 71  08/13/2021 Monday at 09:04 AM    156 / 95  08/13/2021 Monday at 08:48 AM    138 / 85  08/12/2021 'Sunday at 09:19 AM     155 / 88  08/11/2021 Saturday at 05:03 PM   146 / 84  08/11/2021 Saturday at 04:10 PM   152 / 88  08/11/2021 Saturday at 09:11 AM   151 / 91  08/11/2021 Saturday at 09:08 AM   152 / 90  08/10/2021 Friday at 08:07 AM        167 / 99  08/09/2021 Thursday at 09:26 AM   134 / 78  08/08/2021 Wednesday at 09:07 PM 121 / 74  08/07/2021 Tuesday at 08:46 PM    125 / 73  08/07/2021 Tuesday at 02:51 PM    160 / 97  08/07/2021 Tuesday at 09:05 AM    170 / 95  08/06/2021 Monday at 07:48 PM     10'$ 9 / 58

## 2021-08-16 ENCOUNTER — Other Ambulatory Visit: Payer: Self-pay | Admitting: Cardiology

## 2021-08-16 ENCOUNTER — Encounter: Payer: Self-pay | Admitting: Neurology

## 2021-08-28 ENCOUNTER — Other Ambulatory Visit: Payer: Self-pay | Admitting: Neurology

## 2021-08-28 DIAGNOSIS — G20 Parkinson's disease: Secondary | ICD-10-CM

## 2021-09-12 DIAGNOSIS — R112 Nausea with vomiting, unspecified: Secondary | ICD-10-CM | POA: Diagnosis not present

## 2021-09-12 DIAGNOSIS — Z8673 Personal history of transient ischemic attack (TIA), and cerebral infarction without residual deficits: Secondary | ICD-10-CM | POA: Diagnosis not present

## 2021-09-12 DIAGNOSIS — G20 Parkinson's disease: Secondary | ICD-10-CM | POA: Diagnosis not present

## 2021-09-12 DIAGNOSIS — K2270 Barrett's esophagus without dysplasia: Secondary | ICD-10-CM | POA: Diagnosis not present

## 2021-09-12 DIAGNOSIS — R54 Age-related physical debility: Secondary | ICD-10-CM | POA: Diagnosis not present

## 2021-09-12 DIAGNOSIS — I10 Essential (primary) hypertension: Secondary | ICD-10-CM | POA: Diagnosis not present

## 2021-09-14 ENCOUNTER — Other Ambulatory Visit: Payer: Self-pay | Admitting: Cardiology

## 2021-09-19 ENCOUNTER — Other Ambulatory Visit: Payer: Self-pay | Admitting: Cardiology

## 2021-10-06 ENCOUNTER — Other Ambulatory Visit: Payer: Self-pay | Admitting: Neurology

## 2021-10-06 DIAGNOSIS — G20A1 Parkinson's disease without dyskinesia, without mention of fluctuations: Secondary | ICD-10-CM

## 2021-10-12 DIAGNOSIS — I10 Essential (primary) hypertension: Secondary | ICD-10-CM | POA: Diagnosis not present

## 2021-10-12 DIAGNOSIS — G20C Parkinsonism, unspecified: Secondary | ICD-10-CM | POA: Diagnosis not present

## 2021-10-12 DIAGNOSIS — Z8673 Personal history of transient ischemic attack (TIA), and cerebral infarction without residual deficits: Secondary | ICD-10-CM | POA: Diagnosis not present

## 2021-10-12 DIAGNOSIS — K2270 Barrett's esophagus without dysplasia: Secondary | ICD-10-CM | POA: Diagnosis not present

## 2021-10-12 DIAGNOSIS — R112 Nausea with vomiting, unspecified: Secondary | ICD-10-CM | POA: Diagnosis not present

## 2021-10-12 DIAGNOSIS — R54 Age-related physical debility: Secondary | ICD-10-CM | POA: Diagnosis not present

## 2021-10-22 DIAGNOSIS — R54 Age-related physical debility: Secondary | ICD-10-CM | POA: Diagnosis not present

## 2021-10-22 DIAGNOSIS — I10 Essential (primary) hypertension: Secondary | ICD-10-CM | POA: Diagnosis not present

## 2021-10-22 DIAGNOSIS — R519 Headache, unspecified: Secondary | ICD-10-CM | POA: Diagnosis not present

## 2021-10-22 DIAGNOSIS — Z79899 Other long term (current) drug therapy: Secondary | ICD-10-CM | POA: Diagnosis not present

## 2021-10-22 DIAGNOSIS — G20A1 Parkinson's disease without dyskinesia, without mention of fluctuations: Secondary | ICD-10-CM | POA: Diagnosis not present

## 2021-10-22 DIAGNOSIS — D649 Anemia, unspecified: Secondary | ICD-10-CM | POA: Diagnosis not present

## 2021-10-22 DIAGNOSIS — Z8673 Personal history of transient ischemic attack (TIA), and cerebral infarction without residual deficits: Secondary | ICD-10-CM | POA: Diagnosis not present

## 2021-10-22 DIAGNOSIS — Z23 Encounter for immunization: Secondary | ICD-10-CM | POA: Diagnosis not present

## 2021-10-22 DIAGNOSIS — E1169 Type 2 diabetes mellitus with other specified complication: Secondary | ICD-10-CM | POA: Diagnosis not present

## 2021-10-22 DIAGNOSIS — E559 Vitamin D deficiency, unspecified: Secondary | ICD-10-CM | POA: Diagnosis not present

## 2021-10-23 ENCOUNTER — Telehealth: Payer: Self-pay

## 2021-10-23 DIAGNOSIS — E1169 Type 2 diabetes mellitus with other specified complication: Secondary | ICD-10-CM | POA: Diagnosis not present

## 2021-10-23 DIAGNOSIS — N390 Urinary tract infection, site not specified: Secondary | ICD-10-CM | POA: Diagnosis not present

## 2021-10-23 NOTE — Telephone Encounter (Signed)
Spoke with patient's son Nimish. He took his moms BP reading this morning and it was 159/102 so he gave her 1/2 tablet of valsartan 40 mg. The patient tried to get up and sat right back down. Patient reported being dizzy. Son checked her BP on alternative BP device it read 68/39, and then again 85/43. Issues with our BP device son to swap out today.  Son states they have not been giving patient valsartan. They take her BP reading in the morning to determine if she needs valsartan or not. Son is worried "valsartan lowers patient BP too much." Please advise.  BP Summary: Average Systolic BP Level 149.70 mmHg Lowest Systolic BP Level 263 mmHg Highest Systolic BP Level 785 mmHg  BP Readings: 10/23/2021 Tuesday at 11:13 AM 111 / 61      10/23/2021 Tuesday at 10:39 AM 100 / 55      10/23/2021 Tuesday at 07:31 AM 159 / 102      10/22/2021 Monday at 07:01 PM 144 / 86      10/21/2021 'Sunday at 09:14 AM 107 / 62      10/20/2021 Saturday at 10:39 AM 107 / 67      10/20/2021 Saturday at 10:33 AM 132 / 79      10/19/2021 Friday at 02:26 PM 132 / 77      10/18/2021 Thursday at 05:06 PM 156 / 104      10/18/2021 Thursday at 12:30 PM 124 / 81      10/17/2021 Wednesday at 02:18 PM 147 / 87      10/17/2021 Wednesday at 02:11 PM 149 / 88      10/17/2021 Wednesday at 07:53 AM 143 / 91      10/17/2021 Wednesday at 05:25 AM 155 / 92      10/17/2021 Wednesday at 05:24 AM 156 / 90      10/16/2021 Tuesday at 06:18 PM 142 / 86      10/16/2021 Tuesday at 08:51 AM 146 / 95      10'$ /09/2021 Monday at 09:55 PM 128 / 91

## 2021-10-23 NOTE — Telephone Encounter (Signed)
Given the extreme lows, we may just have to hold valsartan for next week or two and see how her blood pressure responds.  Thanks MJP

## 2021-11-06 NOTE — Telephone Encounter (Signed)
Look acceptable.  Thanks MJP

## 2021-11-12 DIAGNOSIS — G20C Parkinsonism, unspecified: Secondary | ICD-10-CM | POA: Diagnosis not present

## 2021-11-12 DIAGNOSIS — R112 Nausea with vomiting, unspecified: Secondary | ICD-10-CM | POA: Diagnosis not present

## 2021-11-12 DIAGNOSIS — I10 Essential (primary) hypertension: Secondary | ICD-10-CM | POA: Diagnosis not present

## 2021-11-12 DIAGNOSIS — R54 Age-related physical debility: Secondary | ICD-10-CM | POA: Diagnosis not present

## 2021-11-12 DIAGNOSIS — K2270 Barrett's esophagus without dysplasia: Secondary | ICD-10-CM | POA: Diagnosis not present

## 2021-11-12 DIAGNOSIS — Z8673 Personal history of transient ischemic attack (TIA), and cerebral infarction without residual deficits: Secondary | ICD-10-CM | POA: Diagnosis not present

## 2021-11-12 NOTE — Telephone Encounter (Signed)
Continue to hold Valsartan. Lets try hydralazine 25 mg tid prn for SBP >150 and/or DBP >100 mmHg  Thanks MJP

## 2021-11-14 ENCOUNTER — Other Ambulatory Visit: Payer: Self-pay

## 2021-11-14 DIAGNOSIS — I10 Essential (primary) hypertension: Secondary | ICD-10-CM

## 2021-11-14 MED ORDER — HYDRALAZINE HCL 25 MG PO TABS: ORAL_TABLET | ORAL | 2 refills | Status: DC

## 2021-11-14 NOTE — Progress Notes (Signed)
Per discussion with Dr. Virgina Jock - discontinuing valsartan (patient was previously holding med) and ordering hydralazine '25mg'$  daily as needed for SBP >150 OR DBP >100.  Spoke with patient's son, Nimish about the change.  11/13/2021 Tuesday at 05:13 PM 139 / 94      11/13/2021 Tuesday at 01:33 PM 148 / 98      11/13/2021 Tuesday at 12:19 AM 121 / 81      11/12/2021 Monday at 11:14 PM 160 / 102      11/10/2021 Saturday at 09:41 AM 160 / 99      11/10/2021 Saturday at 09:39 AM 159 / 98      11/09/2021 Friday at 04:07 PM 118 / 73      11/09/2021 Friday at 08:55 AM 123 / 70      11/09/2021 Friday at 05:39 AM 171 / 109 11/08/2021 Thursday at 11:04 AM 110 / 70      11/08/2021 Thursday at 08:33 AM 165 / 97

## 2021-12-12 DIAGNOSIS — I10 Essential (primary) hypertension: Secondary | ICD-10-CM | POA: Diagnosis not present

## 2021-12-25 NOTE — Progress Notes (Signed)
Assessment/Plan:   1.  Parkinson's disease             -Continue carbidopa/levodopa 25/100, 2 tablets at 6 AM/2 tablet at 10 AM/2 tablets at 2 PM/2 tablet at 6 PM              -continue carbidopa/levodopa 50/200 at bedtime  -she has mild L sided dyskinesia.  While that is not bothersome to patient, she wanted to change med for tremor at night.  I told them I don't want to do that right now as I think I will cause more SE than benefit.  She has no tremor right now but just took med.     2.  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  Patient/family declined loop recorder   3.  Headache/insomia  -increase trazodone, 50 mg nightly.  R/b/se discussed  -f/u pcp re: sinus ds on CT.    4.  B12 deficiency  -B12 only 200 when drawn at pcp office.    -On injections with primary care   Subjective:   Katherine Novak was seen today in follow up for Parkinsons disease.  My previous records were reviewed prior to todays visit as well as outside records available to me. Son accompanies patient and supplements history.  Last visit, we did a CT brain because of complaints of intermittent headache.  Intracranially, it was unremarkable for anything acute, but there was "bubbly fluid in the left sphenoid sinus."  We did tell them that this could contribute to headaches and to follow-up with primary care in that regard.  They haven't seen the pcp for that yet and she still has headache.  We did start her on trazodone last visit and they report that it is helping with the sleep.  It is a bit less helpful now compared to when we first started it and they wanted to see if we need a higher dose.  Cardiology has been following the patient.  Her valsartan has been discontinued because of low blood pressure and hydralazine has been ordered on an  as-needed basis.  One fall since last visit - 3 weeks ago - at daughters and son doesn't know details.  Didn't get hurt.    Current prescribed movement disorder medications: Carbidopa/levodopa 25/100, 2 po qid Carbidopa/levodopa 50/200 at bedtime  Trazodone, 50 mg nightly (taking 1/2 po q hs) Plavix, 75 mg daily Aspirin, 81 mg daily  Prior meds:  mirtazapine (took self off - worked good for sleep when on)  ALLERGIES:   Allergies  Allergen Reactions   Sulfa Antibiotics Swelling    Angioedema of lips , throat    CURRENT MEDICATIONS:  Outpatient Encounter Medications as of 12/27/2021  Medication Sig   acetaminophen (TYLENOL) 500 MG tablet Take 1 tablet by mouth as needed.   alendronate (FOSAMAX) 70 MG tablet Take 70 mg by mouth every Wednesday.    aspirin EC 81 MG tablet Take 81 mg by mouth daily. Swallow whole.   carbidopa-levodopa (SINEMET CR) 50-200 MG tablet Take 1 tablet by mouth at bedtime.   carbidopa-levodopa (SINEMET IR) 25-100 MG tablet TAKE 2 TABLETS AT 6 AM, 1 TABLET AT  10 AM, 2 TABLETS AT 2 PM, 2 TABLETS AT 6 PM   diclofenac Sodium (VOLTAREN) 1 % GEL Apply 4 g topically 4 (four) times daily.   hydrALAZINE (APRESOLINE) 25 MG tablet Take 1 tablet by mouth daily as needed for SBP (top number) > 150 OR DBP (bottom number) >100   metoprolol tartrate (LOPRESSOR) 25 MG tablet TAKE 1 TABLET BY MOUTH TWICE A DAY   mirtazapine (REMERON) 15 MG tablet TAKE 1 TABLET BY MOUTH EVERYDAY AT BEDTIME   pantoprazole (PROTONIX) 40 MG tablet Take 40 mg by mouth daily.   rosuvastatin (CRESTOR) 10 MG tablet Take 1 tablet (10 mg total) by mouth daily.   traZODone (DESYREL) 50 MG tablet 1/2 po q hs   No facility-administered encounter medications on file as of 12/27/2021.    Objective:   PHYSICAL EXAMINATION:    VITALS:   Vitals:   12/27/21 0757  BP: 116/62  Pulse: 72  SpO2: 98%  Weight: 110 lb 9.6 oz (50.2 kg)  Height: '4\' 11"'$  (1.499 m)      GEN:  The patient appears stated age  and is in NAD. HEENT:  Normocephalic, atraumatic.  The mucous membranes are moist. The superficial temporal arteries are without ropiness or tenderness. CV:  RRR Lungs:  CTAB Neck/HEME:  There are no carotid bruits bilaterally.  Neurological examination:  Orientation: The patient is alert  Cranial nerves: There is good facial symmetry with facial hypomimia.  Patient does not speak my native language, so unable to assess fluency/clarity. Soft palate rises symmetrically and there is no tongue deviation. Hearing is intact to conversational tone. Sensation: Sensation is intact to light touch throughout Motor: Strength is at least antigravity x4.  Movement examination: Tone: There is normal tone in the upper and lower extremities Abnormal movements: no tremor.  There is L hemibody dyskinesia Coordination:  There is slowness with rapid alternating movements in upper and lower extremities, but not necessarily decremation Gait and Station: The patient is assisted out of the transport chair.  She is given a walker.  She is careful.  She is short stepped.  Shuffles some.    I have reviewed and interpreted the following labs independently    Chemistry      Component Value Date/Time   NA 142 07/21/2020 1158   NA 143 02/01/2013 0911   K 4.7 07/21/2020 1158   K 4.0 02/01/2013 0911   CL 105 07/21/2020 1158   CO2 24 07/21/2020 1158   CO2 24 02/01/2013 0911   BUN 14 07/21/2020 1158   BUN 11.5 02/01/2013 0911   CREATININE 0.79 07/21/2020 1158   CREATININE 0.7 02/01/2013 0911      Component Value Date/Time   CALCIUM 9.0 07/21/2020 1158   CALCIUM 9.4 02/01/2013 0911   ALKPHOS 60 12/09/2019 1605   ALKPHOS 80 02/01/2013 0911   AST 13 (L) 12/09/2019 1605   AST 19 02/01/2013 0911   ALT <5 12/09/2019 1605   ALT 14 02/01/2013 0911   BILITOT 0.5 12/09/2019 1605   BILITOT 0.28 02/01/2013 0911       Lab Results  Component Value Date   WBC 5.4 12/09/2019   HGB 10.8 (L) 12/09/2019   HCT 36.7  12/09/2019   MCV 86.4 12/09/2019   PLT 168 12/09/2019    No results found for: "TSH"   Total time spent on today's visit was 32 minutes, including both face-to-face time and nonface-to-face time.  Time included that spent on review of records (prior notes available  to me/labs/imaging if pertinent), discussing treatment and goals, answering patient's questions and coordinating care.  Cc:  [name], MD

## 2021-12-27 ENCOUNTER — Other Ambulatory Visit: Payer: Self-pay

## 2021-12-27 ENCOUNTER — Ambulatory Visit (INDEPENDENT_AMBULATORY_CARE_PROVIDER_SITE_OTHER): Payer: Medicare Other | Admitting: Neurology

## 2021-12-27 VITALS — BP 116/62 | HR 72 | Ht 59.0 in | Wt 110.6 lb

## 2021-12-27 DIAGNOSIS — G20A1 Parkinson's disease without dyskinesia, without mention of fluctuations: Secondary | ICD-10-CM

## 2021-12-27 DIAGNOSIS — G20B2 Parkinson's disease with dyskinesia, with fluctuations: Secondary | ICD-10-CM

## 2021-12-27 MED ORDER — CARBIDOPA-LEVODOPA ER 50-200 MG PO TBCR: 1.0000 | EXTENDED_RELEASE_TABLET | Freq: Every day | ORAL | 1 refills | Status: DC

## 2021-12-27 MED ORDER — CARBIDOPA-LEVODOPA 25-100 MG PO TABS
ORAL_TABLET | ORAL | 1 refills | Status: DC
Start: 2021-12-27 — End: 2022-01-14

## 2021-12-27 MED ORDER — TRAZODONE HCL 50 MG PO TABS: 50.0000 mg | ORAL_TABLET | Freq: Every day | ORAL | 1 refills | Status: DC

## 2021-12-27 NOTE — Patient Instructions (Signed)
Local and Online Resources for Power over Parkinson's Group  December 2023    LOCAL [place] PARKINSON'S GROUPS   Power over Parkinson's Group:    Power Over Parkinson's Patient Education Group will be Wednesday, December 13th-*Hybrid meting*- in person at [place] [facility] location and via WEBEX, 2:00-3:00 pm.   Starting in November, Power over Parkinson's and Care Partner Groups will meet together, with plans for separate break out session for caregivers (*this will be evolving over the next few months) Upcoming Power over Parkinson's Meetings/Care Partner Support:  2nd Wednesdays of the month at 2 pm:   December 13th, January 10th  Contact Amy Marriott at [email or web address] if interested in participating in this group    LOCAL EVENTS AND NEW OFFERINGS  Parkinson's Holiday Party!  Wednesday, December 6th, 4:00-5:00 pm.  [employer] [facility].  RSVP to Karen Simmers at [phone] or [email or web address] New PWR! Moves Community Fitness Instructor-Led Classes offering at [employer]!  TUESDAYS and Wednesdays 1-2 pm.   Contact Christy Weaver at  [email or web address]  or [phone] (Tuesday classes are modified for chair and standing only) Dance for Parkinson 's classes will be on Tuesdays 9:30am-10:30am starting October 3-December 12 with a break the week of November 21st. Located in the [facility], in the first floor of the [facility] ([address] To register:  [email or web address] or [phone]  Drumming for Parkinson's will be held on 2nd and 4th Mondays at 11:00 am.   Located at the [employer] ([address] [place]  Contact Jane Maydian at [email or web address] or [phone]  Through support from the Parkinson's Foundation, the Dance and Drumming for Parkinson's classes are free for both patients and caregivers.    [facility] [employer] Parkinson's Tai Chi Class, Mondays at  11 am.  Call [phone] for details   ONLINE EDUCATION AND SUPPORT  Parkinson Foundation:  www.parkinson.org  PD Health at Home continues:  Mindfulness Mondays, Wellness Wednesdays, Fitness Fridays   Upcoming Education:    Eating and Feeling Well through the Holidays. Wednesday, Dec. 6th,  1-2 pm  Hospital Safety.  Wednesday, Dec. 13th, 1-2 pm Register for expert briefings (webinars) at [email or web address].org/resources-support/online-[email or web address]/expert-[email or web address]-[email or web address]  Please check out their website to sign up for emails and see their full online offerings      [employer] Foundation:  www.michaeljfox.org   Third Thursday Webinars:  On the third Thursday of every month at 12 p.m. ET, join our free live webinars to learn about various aspects of living with Parkinson's disease and our work to speed medical breakthroughs.  Upcoming Webinar:  Tools for Diagnosing and Visualizing Parkinson's Disease.  Thursday, December 21st at 12 noon. Check out additional information on their website to see their full online offerings    Davis Phinney Foundation:  www.davisphinneyfoundation.org  Upcoming Webinar:   Stay tuned  Webinar Series:  Living with Parkinson's Meetup.   Third Thursdays each month, 3 pm  Care Partner Monthly Meetup.  With Connie Carpenter Phinney.  First Tuesday of each month, 2 pm  Check out additional information to Live Well Today on their website    Parkinson and Movement Disorders (PMD) Alliance:  www.pmdalliance.org  [employer]:  Online Education Events  Sign up for emails, which are sent weekly to give you updates on programming and online offerings    Parkinson's Association of [place]:  www.parkinsonassociation.org  Information on online support groups, education events, and online exercises including Yoga, Parkinson's exercises and more-LOTS of information on   links to PD resources and online events  Virtual Support Group through Parkinson's Association  of the [place]; next one is scheduled for Wednesday, December 6th  at 2 pm.  (These are typically scheduled for the 1st Wednesday of the month at 2 pm).  Visit website for details.   MOVEMENT AND EXERCISE OPPORTUNITIES  PWR! Moves Classes at [place].  Wednesdays 10 and 11 am.   Contact Amy Marriott, PT [email or web address] if interested.  NEW PWR! Moves Class offerings at [employer].  *TUESDAYS* and Wednesdays 1-2 pm.    Contact Vonna Kotyk at  [email or web address]    Parkinson's Wellness Recovery (PWR! Moves)  www.pwr4life.org  Info on the PWR! Virtual Experience:  You will have access to our expertise?through self-assessment, guided plans that start with the PD-specific fundamentals, educational content, tips, Q&A with an expert, and a growing Art therapist of PD-specific pre-recorded and live exercise classes of varying types and intensity - both physical and cognitive! If that is not enough, we offer 1:1 wellness consultations (in-person or virtual) to personalize your PWR! Research scientist (medical).   [place] Fridays:   As part of the PD Health @ Home program, this free video series focuses each week on one aspect of fitness designed to support people living with Parkinson's.? These weekly videos highlight the [place] fitness guidelines for people with Parkinson's disease.  ModemGamers.[email or web address]   Dance for PD website is offering free, live-stream classes throughout the week, as well as links to [employer] of classes:  [email or web address].[email or web address]  Virtual dance and Pilates for Parkinson's classes: Click on the Community Tab> Parkinson's Movement Initiative Tab.  To register for classes and for more information, visit [email or web address] and click the "community" tab.   [employer] Parkinson's Cycling Classes   [facility]:  Thursdays @ Noon-Live classes at [employer] ([employer] [facility] [email or web address]?or [phone])  [facility]: Virtual Classes Mondays and Thursdays Jeanette Caprice classes Tuesday, Wednesday and Thursday (contact [place] at [email or web address] ?or [phone])  [place]  Varied levels of classes are offered Tuesdays and Thursdays at [employer].   Stretching with Verdis Frederickson weekly class is also offered for people with Parkinson's  To observe a class or for more information, call [phone] or email Hezzie Bump at [email or web address]   ADDITIONAL SUPPORT AND RESOURCES  [employer]:Online Caregiver Education Opportunities:  www.well-springsolutions.org/caregiver-education/caregiver-support-group.  You may also contact Vickki Muff at [email or web address] or [phone].     [employer] Navigator:  Just1Navigator program, a?free service to help individuals and families through the journey of determining care for older adults.  The "Navigator" is a Education officer, museum, Arnell Asal, who will speak with a prospective client and/or loved ones to provide an assessment of the situation and a set of recommendations for a personalized care plan -- all free of charge, and whether?[employer] offers the needed service or not. If the need is not a service we provide, we are well-connected with reputable programs in town that we can refer you to.  www.well-springsolutions.org or to speak with the Navigator, call [phone].

## 2022-01-04 ENCOUNTER — Ambulatory Visit: Payer: Medicare Other | Admitting: Neurology

## 2022-01-10 NOTE — Telephone Encounter (Signed)
error 

## 2022-01-12 ENCOUNTER — Other Ambulatory Visit: Payer: Self-pay | Admitting: Neurology

## 2022-01-12 DIAGNOSIS — G20A1 Parkinson's disease without dyskinesia, without mention of fluctuations: Secondary | ICD-10-CM

## 2022-01-12 DIAGNOSIS — I10 Essential (primary) hypertension: Secondary | ICD-10-CM | POA: Diagnosis not present

## 2022-01-14 ENCOUNTER — Other Ambulatory Visit: Payer: Self-pay

## 2022-01-14 DIAGNOSIS — G20A1 Parkinson's disease without dyskinesia, without mention of fluctuations: Secondary | ICD-10-CM

## 2022-01-14 MED ORDER — CARBIDOPA-LEVODOPA 25-100 MG PO TABS: ORAL_TABLET | ORAL | 0 refills | Status: DC

## 2022-02-08 ENCOUNTER — Other Ambulatory Visit: Payer: Self-pay | Admitting: Cardiology

## 2022-02-08 DIAGNOSIS — I10 Essential (primary) hypertension: Secondary | ICD-10-CM

## 2022-02-12 DIAGNOSIS — I10 Essential (primary) hypertension: Secondary | ICD-10-CM | POA: Diagnosis not present

## 2022-03-14 DIAGNOSIS — I10 Essential (primary) hypertension: Secondary | ICD-10-CM | POA: Diagnosis not present

## 2022-04-13 DIAGNOSIS — I10 Essential (primary) hypertension: Secondary | ICD-10-CM | POA: Diagnosis not present

## 2022-04-23 DIAGNOSIS — I714 Abdominal aortic aneurysm, without rupture, unspecified: Secondary | ICD-10-CM | POA: Diagnosis not present

## 2022-04-23 DIAGNOSIS — C9110 Chronic lymphocytic leukemia of B-cell type not having achieved remission: Secondary | ICD-10-CM | POA: Diagnosis not present

## 2022-04-23 DIAGNOSIS — G20A1 Parkinson's disease without dyskinesia, without mention of fluctuations: Secondary | ICD-10-CM | POA: Diagnosis not present

## 2022-04-23 DIAGNOSIS — R54 Age-related physical debility: Secondary | ICD-10-CM | POA: Diagnosis not present

## 2022-04-23 DIAGNOSIS — R5381 Other malaise: Secondary | ICD-10-CM | POA: Diagnosis not present

## 2022-04-23 DIAGNOSIS — E119 Type 2 diabetes mellitus without complications: Secondary | ICD-10-CM | POA: Diagnosis not present

## 2022-04-23 DIAGNOSIS — Z8673 Personal history of transient ischemic attack (TIA), and cerebral infarction without residual deficits: Secondary | ICD-10-CM | POA: Diagnosis not present

## 2022-05-13 DIAGNOSIS — I10 Essential (primary) hypertension: Secondary | ICD-10-CM | POA: Diagnosis not present

## 2022-05-29 ENCOUNTER — Telehealth: Payer: Self-pay

## 2022-05-29 NOTE — Telephone Encounter (Signed)
Patients son calling to let you know he has given the patient half of a 25 mg hydralazine and her BP is still around 180/100.  Call back # [phone] (Patients Son- Nimish)

## 2022-05-30 NOTE — Telephone Encounter (Signed)
I just saw this message. Can you please check on her this morning and let me know?  Thanks MJP

## 2022-05-30 NOTE — Telephone Encounter (Signed)
LM on sons phone asking how her blood pressure is doing this morning.

## 2022-06-12 DIAGNOSIS — I10 Essential (primary) hypertension: Secondary | ICD-10-CM | POA: Diagnosis not present

## 2022-06-16 ENCOUNTER — Other Ambulatory Visit: Payer: Self-pay | Admitting: Neurology

## 2022-06-16 DIAGNOSIS — G20A1 Parkinson's disease without dyskinesia, without mention of fluctuations: Secondary | ICD-10-CM

## 2022-06-27 NOTE — Progress Notes (Deleted)
Virtual Visit Via Video       Consent was obtained for video visit:  {yes no:314532} Answered questions that patient had about telehealth interaction:  {yes no:314532} I discussed the limitations, risks, security and privacy concerns of performing an evaluation and management service by telemedicine. I also discussed with the patient that there may be a patient responsible charge related to this service. The patient expressed understanding and agreed to proceed.  Pt location: Home Physician Location: office Name of referring provider:  Mila Palmer, MD I connected with Katherine Novak at patients initiation/request on 06/28/2022 at 10:45 AM EDT by video enabled telemedicine application and verified that I am speaking with the correct person using two identifiers. Pt MRN:  [id number] Pt DOB:  06/04/1934 Video Participants:  Katherine Novak;  ***  Assessment/Plan:   1.  Parkinson's disease             -Continue carbidopa/levodopa 25/100, 2 tablets at 6 AM/2 tablet at 10 AM/2 tablets at 2 PM/2 tablet at 6 PM              -continue carbidopa/levodopa 50/200 at bedtime  -she has mild L sided dyskinesia.  While that is not bothersome to patient, she wanted to change med for tremor at night.  I told them I don't want to do that right now as I think I will cause more SE than benefit.  She has no tremor right now but just took med.     2.  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  Patient/family declined loop recorder   3.  Headache/insomia  -increase trazodone, 50 mg nightly.  R/b/se discussed  -f/u pcp re: sinus ds on CT.    4.  B12 deficiency  -B12 only 200 when drawn at pcp office.    -On injections with primary care   Subjective:   Katherine Novak was seen today in follow up for Parkinsons disease.  My  previous records were reviewed prior to todays visit as well as outside records available to me. *** accompanies patient and supplements history.  Previously, we did a CT brain because of complaints of intermittent headache.  Intracranially, it was unremarkable for anything acute, but there was "bubbly fluid in the left sphenoid sinus."  We did tell them that this could contribute to headaches and to follow-up with primary care in that regard.  When I saw her last visit, they had not yet followed up with primary care.  We did decide to go ahead and increase the trazodone to 50 mg at night, both for sleep as well as for headache.  Current prescribed movement disorder medications: Carbidopa/levodopa 25/100, 2 po qid Carbidopa/levodopa 50/200 at bedtime  Trazodone, 50 mg nightly  Plavix, 75 mg daily Aspirin, 81 mg daily  Prior meds:  mirtazapine (took self off - worked good for sleep when on)  ALLERGIES:   Allergies  Allergen Reactions   Sulfa Antibiotics Swelling    Angioedema of lips , throat    CURRENT MEDICATIONS:  Outpatient Encounter Medications as of 12/27/2021  Medication Sig   acetaminophen (TYLENOL) 500 MG tablet Take 1 tablet by mouth as  needed.   alendronate (FOSAMAX) 70 MG tablet Take 70 mg by mouth every Wednesday.    aspirin EC 81 MG tablet Take 81 mg by mouth daily. Swallow whole.   carbidopa-levodopa (SINEMET CR) 50-200 MG tablet Take 1 tablet by mouth at bedtime.   carbidopa-levodopa (SINEMET IR) 25-100 MG tablet TAKE 2 TABLETS AT 6 AM, 1 TABLET AT 10 AM, 2 TABLETS AT 2 PM, 2 TABLETS AT 6 PM   diclofenac Sodium (VOLTAREN) 1 % GEL Apply 4 g topically 4 (four) times daily.   hydrALAZINE (APRESOLINE) 25 MG tablet Take 1 tablet by mouth daily as needed for SBP (top number) > 150 OR DBP (bottom number) >100   metoprolol tartrate (LOPRESSOR) 25 MG tablet TAKE 1 TABLET BY MOUTH TWICE A DAY   mirtazapine (REMERON) 15 MG tablet TAKE 1 TABLET BY MOUTH EVERYDAY AT BEDTIME    pantoprazole (PROTONIX) 40 MG tablet Take 40 mg by mouth daily.   rosuvastatin (CRESTOR) 10 MG tablet Take 1 tablet (10 mg total) by mouth daily.   traZODone (DESYREL) 50 MG tablet 1/2 po q hs   No facility-administered encounter medications on file as of 12/27/2021.    Objective:   PHYSICAL EXAMINATION:    VITALS:   Vitals:   12/27/21 0757  BP: 116/62  Pulse: 72  SpO2: 98%  Weight: 110 lb 9.6 oz (50.2 kg)  Height: 4\' 11"  (1.499 m)    GEN:  The patient appears stated age and is in NAD. HEENT:  Normocephalic, atraumatic.  The mucous membranes are moist. The superficial temporal arteries are without ropiness or tenderness.  Neurological examination:  Orientation: The patient is alert  Cranial nerves: There is good facial symmetry with facial hypomimia.  Patient does not speak my native language, so unable to assess fluency/clarity. Soft palate rises symmetrically and there is no tongue deviation. Hearing is intact to conversational tone. Motor: Strength is at least antigravity x4.  Movement examination: Tone: unable Abnormal movements: no tremor.  There is L hemibody dyskinesia Coordination:  There is slowness with rapid alternating movements in upper and lower extremities, but not necessarily decremation Gait and Station: ambulates well in home with the walker  I have reviewed and interpreted the following labs independently    Chemistry      Component Value Date/Time   NA 142 07/21/2020 1158   NA 143 02/01/2013 0911   K 4.7 07/21/2020 1158   K 4.0 02/01/2013 0911   CL 105 07/21/2020 1158   CO2 24 07/21/2020 1158   CO2 24 02/01/2013 0911   BUN 14 07/21/2020 1158   BUN 11.5 02/01/2013 0911   CREATININE 0.79 07/21/2020 1158   CREATININE 0.7 02/01/2013 0911      Component Value Date/Time   CALCIUM 9.0 07/21/2020 1158   CALCIUM 9.4 02/01/2013 0911   ALKPHOS 60 12/09/2019 1605   ALKPHOS 80 02/01/2013 0911   AST 13 (L) 12/09/2019 1605   AST 19 02/01/2013 0911    ALT <5 12/09/2019 1605   ALT 14 02/01/2013 0911   BILITOT 0.5 12/09/2019 1605   BILITOT 0.28 02/01/2013 0911       Lab Results  Component Value Date   WBC 5.4 12/09/2019   HGB 10.8 (L) 12/09/2019   HCT 36.7 12/09/2019   MCV 86.4 12/09/2019   PLT 168 12/09/2019    No results found for: "TSH"  Follow up Instructions      -I discussed the assessment and treatment plan with the patient. The patient was  provided an opportunity to ask questions and all were answered. The patient agreed with the plan and demonstrated an understanding of the instructions.   The patient was advised to call back or seek an in-person evaluation if the symptoms worsen or if the condition fails to improve as anticipated.    Total time spent on today's visit was ***minutes, including both face-to-face time and nonface-to-face time.  Time included that spent on review of records (prior notes available to me/labs/imaging if pertinent), discussing treatment and goals, answering patient's questions and coordinating care.   Kerin Salen, DO   Cc:  Mila Palmer, MD

## 2022-06-28 ENCOUNTER — Telehealth: Payer: 59 | Admitting: Neurology

## 2022-06-28 ENCOUNTER — Telehealth: Payer: Self-pay | Admitting: Neurology

## 2022-06-28 ENCOUNTER — Encounter: Payer: Self-pay | Admitting: Neurology

## 2022-06-28 NOTE — Telephone Encounter (Signed)
Patient's son called at 4:10pm stating that the link for the video appointment did not come through on his phone. Patient is requesting another video chat appointment . Evlyn Clines

## 2022-07-01 NOTE — Telephone Encounter (Signed)
Called and lmom to get patient resch

## 2022-07-02 DIAGNOSIS — R519 Headache, unspecified: Secondary | ICD-10-CM | POA: Diagnosis not present

## 2022-07-02 DIAGNOSIS — E119 Type 2 diabetes mellitus without complications: Secondary | ICD-10-CM | POA: Diagnosis not present

## 2022-07-02 DIAGNOSIS — G20A1 Parkinson's disease without dyskinesia, without mention of fluctuations: Secondary | ICD-10-CM | POA: Diagnosis not present

## 2022-09-01 ENCOUNTER — Other Ambulatory Visit: Payer: Self-pay | Admitting: Neurology

## 2022-09-01 DIAGNOSIS — G20A1 Parkinson's disease without dyskinesia, without mention of fluctuations: Secondary | ICD-10-CM

## 2022-09-02 ENCOUNTER — Telehealth: Payer: Self-pay

## 2022-09-02 NOTE — Telephone Encounter (Signed)
Lmom to call back to get a follow up appt

## 2022-09-02 NOTE — Telephone Encounter (Signed)
have a refill request for this patient. Patient did not attend last scheduled appointment and I will be unable to fill RX until a new appointment has been made please

## 2022-09-11 ENCOUNTER — Other Ambulatory Visit: Payer: Self-pay | Admitting: Neurology

## 2022-09-11 DIAGNOSIS — G20A1 Parkinson's disease without dyskinesia, without mention of fluctuations: Secondary | ICD-10-CM

## 2022-09-21 ENCOUNTER — Other Ambulatory Visit: Payer: Self-pay | Admitting: Cardiology

## 2022-09-21 DIAGNOSIS — I10 Essential (primary) hypertension: Secondary | ICD-10-CM

## 2022-10-18 ENCOUNTER — Other Ambulatory Visit: Payer: Self-pay | Admitting: Neurology

## 2022-10-18 DIAGNOSIS — G20A1 Parkinson's disease without dyskinesia, without mention of fluctuations: Secondary | ICD-10-CM

## 2022-11-08 DIAGNOSIS — D649 Anemia, unspecified: Secondary | ICD-10-CM | POA: Diagnosis not present

## 2022-11-08 DIAGNOSIS — E559 Vitamin D deficiency, unspecified: Secondary | ICD-10-CM | POA: Diagnosis not present

## 2022-11-08 DIAGNOSIS — R519 Headache, unspecified: Secondary | ICD-10-CM | POA: Diagnosis not present

## 2022-11-08 DIAGNOSIS — Z0000 Encounter for general adult medical examination without abnormal findings: Secondary | ICD-10-CM | POA: Diagnosis not present

## 2022-11-08 DIAGNOSIS — E46 Unspecified protein-calorie malnutrition: Secondary | ICD-10-CM | POA: Diagnosis not present

## 2022-11-08 DIAGNOSIS — M810 Age-related osteoporosis without current pathological fracture: Secondary | ICD-10-CM | POA: Diagnosis not present

## 2022-11-08 DIAGNOSIS — E119 Type 2 diabetes mellitus without complications: Secondary | ICD-10-CM | POA: Diagnosis not present

## 2022-11-08 DIAGNOSIS — C9110 Chronic lymphocytic leukemia of B-cell type not having achieved remission: Secondary | ICD-10-CM | POA: Diagnosis not present

## 2022-11-08 DIAGNOSIS — Z9181 History of falling: Secondary | ICD-10-CM | POA: Diagnosis not present

## 2022-11-08 DIAGNOSIS — E042 Nontoxic multinodular goiter: Secondary | ICD-10-CM | POA: Diagnosis not present

## 2022-11-08 DIAGNOSIS — Z79899 Other long term (current) drug therapy: Secondary | ICD-10-CM | POA: Diagnosis not present

## 2022-11-08 DIAGNOSIS — E1169 Type 2 diabetes mellitus with other specified complication: Secondary | ICD-10-CM | POA: Diagnosis not present

## 2022-11-11 NOTE — Progress Notes (Unsigned)
Virtual Visit Via Video       Consent was obtained for video visit:  {yes no:314532} Answered questions that patient had about telehealth interaction:  {yes no:314532} I discussed the limitations, risks, security and privacy concerns of performing an evaluation and management service by telemedicine. I also discussed with the patient that there may be a patient responsible charge related to this service. The patient expressed understanding and agreed to proceed.  Pt location: Home Physician Location: office Name of referring provider:  Mila Palmer, MD I connected with Katherine Novak at patients initiation/request on 11/12/2022 at  3:30 PM EST by video enabled telemedicine application and verified that I am speaking with the correct person using two identifiers. Pt MRN:  [id number] Pt DOB:  [birth year] Video Participants:  Katherine Novak;  ***  Assessment/Plan:   1.  Parkinson's disease             -Continue carbidopa/levodopa 25/100, 2 tablets at 6 AM/2 tablet at 10 AM/2 tablets at 2 PM/2 tablet at 6 PM              -continue carbidopa/levodopa 50/200 at bedtime             -she has mild L sided dyskinesia.  While that is not bothersome to patient, she wanted to change med for tremor at night.  I told them I don't want to do that right now as I think I will cause more SE than benefit.  She has no tremor right now but just took med.     2.  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  Patient/family declined loop recorder   3.  Headache/insomia             -increase trazodone, 50 mg nightly.  R/b/se discussed             -f/u pcp re: sinus ds on CT.     4.  B12 deficiency             -B12 only 200 when drawn at pcp office.               -On injections with primary care   Subjective:   Katherine Novak was  seen today in follow up for Parkinsons disease.  My previous records were reviewed prior to todays visit as well as outside records available to me. *** accompanies patient and supplements history.  Its been almost a year since I have seen the patient (no showed a visit in June).  Previously, we did a CT brain because of complaints of intermittent headache.  Intracranially, it was unremarkable for anything acute, but there was "bubbly fluid in the left sphenoid sinus."  We did tell them that this could contribute to headaches and to follow-up with primary care in that regard.  When I saw her last visit, they had not yet followed up with primary care.  We did decide to go ahead and increase the trazodone to 50 mg at night, both for sleep as well as for headache.   Current prescribed movement disorder medications: Carbidopa/levodopa 25/100, 2 po qid Carbidopa/levodopa 50/200 at bedtime  Trazodone,  50 mg nightly  Plavix, 75 mg daily Aspirin, 81 mg daily  Prior meds:  mirtazapine (took self off - worked good for sleep when on)  ALLERGIES:   Allergies  Allergen Reactions   Sulfa Antibiotics Swelling    Angioedema of lips , throat    CURRENT MEDICATIONS:  Outpatient Encounter Medications as of 11/12/2022  Medication Sig   acetaminophen (TYLENOL) 500 MG tablet Take 1 tablet by mouth as needed.   alendronate (FOSAMAX) 70 MG tablet Take 70 mg by mouth every Wednesday.    aspirin EC 81 MG tablet Take 81 mg by mouth daily. Swallow whole.   carbidopa-levodopa (SINEMET CR) 50-200 MG tablet TAKE 1 TABLET BY MOUTH EVERYDAY AT BEDTIME   carbidopa-levodopa (SINEMET IR) 25-100 MG tablet TAKE 2 TABLETS AT 6 AM/2 TABLET AT 10 AM/2 TABLETS AT 2 PM/2 TABLET AT 6 PM   diclofenac Sodium (VOLTAREN) 1 % GEL Apply 4 g topically 4 (four) times daily.   hydrALAZINE (APRESOLINE) 25 MG tablet TAKE 1 TABLET BY MOUTH DAILY AS NEEDED FOR SBP (TOP NUMBER) > 150 OR DBP (BOTTOM NUMBER) >100   metoprolol tartrate (LOPRESSOR)  25 MG tablet TAKE 1 TABLET BY MOUTH TWICE A DAY   pantoprazole (PROTONIX) 40 MG tablet Take 40 mg by mouth daily.   rosuvastatin (CRESTOR) 10 MG tablet Take 1 tablet (10 mg total) by mouth daily.   traZODone (DESYREL) 50 MG tablet TAKE 1 TABLET BY MOUTH EVERYDAY AT BEDTIME   No facility-administered encounter medications on file as of 11/12/2022.    Objective:   PHYSICAL EXAMINATION:    VITALS:   There were no vitals filed for this visit.     GEN:  The patient appears stated age and is in NAD. HEENT:  Normocephalic, atraumatic.  The mucous membranes are moist. The superficial temporal arteries are without ropiness or tenderness. CV:  RRR Lungs:  CTAB Neck/HEME:  There are no carotid bruits bilaterally.  Neurological examination:  Orientation: The patient is alert  Cranial nerves: There is good facial symmetry with facial hypomimia.  Patient does not speak my native language, so unable to assess fluency/clarity. Soft palate rises symmetrically and there is no tongue deviation. Hearing is intact to conversational tone. Sensation: Sensation is intact to light touch throughout Motor: Strength is at least antigravity x4.  Movement examination: Tone: There is normal tone in the upper and lower extremities Abnormal movements: no tremor.  There is L hemibody dyskinesia Coordination:  There is slowness with rapid alternating movements in upper and lower extremities, but not necessarily decremation Gait and Station: The patient is assisted out of the transport chair.  She is given a walker.  She is careful.  She is short stepped.  Shuffles some.    I have reviewed and interpreted the following labs independently    Chemistry      Component Value Date/Time   NA 142 07/21/2020 1158   NA 143 02/01/2013 0911   K 4.7 07/21/2020 1158   K 4.0 02/01/2013 0911   CL 105 07/21/2020 1158   CO2 24 07/21/2020 1158   CO2 24 02/01/2013 0911   BUN 14 07/21/2020 1158   BUN 11.5 02/01/2013 0911    CREATININE 0.79 07/21/2020 1158   CREATININE 0.7 02/01/2013 0911      Component Value Date/Time   CALCIUM 9.0 07/21/2020 1158   CALCIUM 9.4 02/01/2013 0911   ALKPHOS 60 12/09/2019 1605   ALKPHOS 80 02/01/2013 0911   AST 13 (L) 12/09/2019 1605  AST 19 02/01/2013 0911   ALT <5 12/09/2019 1605   ALT 14 02/01/2013 0911   BILITOT 0.5 12/09/2019 1605   BILITOT 0.28 02/01/2013 0911       Lab Results  Component Value Date   WBC 5.4 12/09/2019   HGB 10.8 (L) 12/09/2019   HCT 36.7 12/09/2019   MCV 86.4 12/09/2019   PLT 168 12/09/2019    No results found for: "TSH"  Follow up Instructions      -I discussed the assessment and treatment plan with the patient. The patient was provided an opportunity to ask questions and all were answered. The patient agreed with the plan and demonstrated an understanding of the instructions.   The patient was advised to call back or seek an in-person evaluation if the symptoms worsen or if the condition fails to improve as anticipated.    Total time spent on today's visit was ***minutes, including both face-to-face time and nonface-to-face time.  Time included that spent on review of records (prior notes available to me/labs/imaging if pertinent), discussing treatment and goals, answering patient's questions and coordinating care.   Kerin Salen, DO   Cc:  Mila Palmer, MD

## 2022-11-12 ENCOUNTER — Other Ambulatory Visit: Payer: Self-pay

## 2022-11-12 ENCOUNTER — Encounter: Payer: Self-pay | Admitting: Neurology

## 2022-11-12 ENCOUNTER — Telehealth: Payer: 59 | Admitting: Neurology

## 2022-11-12 DIAGNOSIS — G20A1 Parkinson's disease without dyskinesia, without mention of fluctuations: Secondary | ICD-10-CM

## 2022-11-12 DIAGNOSIS — F02B2 Dementia in other diseases classified elsewhere, moderate, with psychotic disturbance: Secondary | ICD-10-CM | POA: Diagnosis not present

## 2022-11-12 DIAGNOSIS — R441 Visual hallucinations: Secondary | ICD-10-CM

## 2022-11-12 DIAGNOSIS — R443 Hallucinations, unspecified: Secondary | ICD-10-CM

## 2022-11-12 DIAGNOSIS — G20B2 Parkinson's disease with dyskinesia, with fluctuations: Secondary | ICD-10-CM

## 2022-11-12 MED ORDER — NUPLAZID 34 MG PO CAPS: ORAL_CAPSULE | ORAL | Status: DC

## 2022-11-13 ENCOUNTER — Telehealth: Payer: Self-pay | Admitting: Neurology

## 2022-11-13 NOTE — Telephone Encounter (Signed)
Pt's daughter came in and wants to see if Dr. Arbutus Leas could send a prescription for 100 mg of the Trazodone? The pt has trouble with taking so many pills, so if she has 100 mg pills then that is one less pill the pt has to take. [facility]

## 2022-11-14 ENCOUNTER — Other Ambulatory Visit: Payer: Self-pay

## 2022-11-14 DIAGNOSIS — G20B2 Parkinson's disease with dyskinesia, with fluctuations: Secondary | ICD-10-CM

## 2022-11-14 DIAGNOSIS — G20A1 Parkinson's disease without dyskinesia, without mention of fluctuations: Secondary | ICD-10-CM

## 2022-11-14 MED ORDER — TRAZODONE HCL 100 MG PO TABS
100.0000 mg | ORAL_TABLET | Freq: Every day | ORAL | 0 refills | Status: DC
Start: 2022-11-14 — End: 2023-02-10

## 2022-11-14 NOTE — Telephone Encounter (Signed)
RX sent and called Nisha to let her know the change and that I have sent the new RX tp [facility]

## 2023-01-18 ENCOUNTER — Other Ambulatory Visit: Payer: Self-pay | Admitting: Neurology

## 2023-01-18 DIAGNOSIS — G20A1 Parkinson's disease without dyskinesia, without mention of fluctuations: Secondary | ICD-10-CM

## 2023-01-23 ENCOUNTER — Other Ambulatory Visit: Payer: Self-pay | Admitting: Neurology

## 2023-01-23 DIAGNOSIS — G20B2 Parkinson's disease with dyskinesia, with fluctuations: Secondary | ICD-10-CM

## 2023-02-08 ENCOUNTER — Other Ambulatory Visit: Payer: Self-pay | Admitting: Neurology

## 2023-02-08 DIAGNOSIS — G20B2 Parkinson's disease with dyskinesia, with fluctuations: Secondary | ICD-10-CM

## 2023-02-08 DIAGNOSIS — F02B2 Dementia in other diseases classified elsewhere, moderate, with psychotic disturbance: Secondary | ICD-10-CM

## 2023-04-14 NOTE — Progress Notes (Unsigned)
 Virtual Visit Via Video       Consent was obtained for video visit:  Yes.   Answered questions that patient had about telehealth interaction:  Yes.   I discussed the limitations, risks, security and privacy concerns of performing an evaluation and management service by telemedicine. I also discussed with the patient that there may be a patient responsible charge related to this service. The patient expressed understanding and agreed to proceed.  Pt location: Home Physician Location: office Name of referring provider:  Mila Palmer, MD I connected with Katherine Novak at patients initiation/request on 04/15/2023 at  3:30 PM EDT by video enabled telemedicine application and verified that I am speaking with the correct person using two identifiers. Pt MRN:  [id number] Pt DOB:  1934-07-30 Video Participants:  Katherine Novak;  daughter in law  Assessment/Plan:   1.  Parkinson's disease             -Take carbidopa/levodopa 25/100, 2 tablets at 8 AM/11 AM/2 PM/5 PM (this is a different dosing schedule)             -continue carbidopa/levodopa 50/200 at 8 PM             -she has mild L sided dyskinesia.  While that is not bothersome to patient, she wanted to change med for tremor at night.  I told them I don't want to do that right now as I think I will cause more SE than benefit.  She has no tremor right now but just took med.     2.  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  Patient/family declined loop recorder   3.  Insomnia             -*** trazodone, 100 mg nightly.  R/b/se discussed    4.  B12 deficiency             -B12 only 200 when drawn at pcp office.               -On injections with primary care  5.  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  Pick up  samples and let me know in a few weeks how she is doing.  Discussed that it could take 4 to 6 weeks to work.  -If this does not help, may transition her to quetiapine.  If I do that, I may have to get rid of the trazodone altogether, given the fact that both of these make patient's quite sleepy.  6.  Daughter-in-law to make a follow-up appointment when she comes in to pick up samples. Subjective:   Katherine Novak was seen today in follow up for Parkinsons disease.  My previous records were reviewed prior to todays visit as well as outside records available to me.  Daughter in law accompanies patient and supplements history.  I gave the patient Nuplazid samples last visit and told them that it could take 4 to 6 weeks to work.  They were supposed to call me back so I could send him in a new prescription for this medication after the samples ran out versus  evaluate the need for quetiapine, but I did not hear back from them.  She is no longer on the Nuplazid, as she only had a sample.  Her trazodone was increased to 100 mg last visit for sleep.   Current prescribed movement disorder medications: Carbidopa/levodopa 25/100, 2 po qid Carbidopa/levodopa 50/200 at bedtime  Trazodone, 100 mg nightly  Plavix, 75 mg daily Aspirin, 81 mg daily Nuplazid  Prior meds:  mirtazapine (took self off - worked good for sleep when on); Nuplazid  ALLERGIES:   Allergies  Allergen Reactions   Sulfa Antibiotics Swelling    Angioedema of lips , throat    CURRENT MEDICATIONS:  Outpatient Encounter Medications as of 04/15/2023  Medication Sig   acetaminophen (TYLENOL) 500 MG tablet Take 1 tablet by mouth as needed.   alendronate (FOSAMAX) 70 MG tablet Take 70 mg by mouth every Wednesday.    aspirin EC 81 MG tablet Take 81 mg by mouth daily. Swallow whole.   carbidopa-levodopa (SINEMET CR) 50-200 MG tablet TAKE 1 TABLET BY MOUTH EVERY DAY AT BEDTIME   carbidopa-levodopa (SINEMET IR) 25-100 MG tablet 2 TABLETS AT 6  AM/2 TABLET AT 10 AM/2 TABLETS AT 2 PM/2 TABLET AT 6 PM   diclofenac Sodium (VOLTAREN) 1 % GEL Apply 4 g topically 4 (four) times daily.   hydrALAZINE (APRESOLINE) 25 MG tablet TAKE 1 TABLET BY MOUTH DAILY AS NEEDED FOR SBP (TOP NUMBER) > 150 OR DBP (BOTTOM NUMBER) >100   metoprolol tartrate (LOPRESSOR) 25 MG tablet TAKE 1 TABLET BY MOUTH TWICE A DAY   pantoprazole (PROTONIX) 40 MG tablet Take 40 mg by mouth daily.   Pimavanserin Tartrate (NUPLAZID) 34 MG CAPS Samples of this drug were given to the patient, quantity 4, Lot Number [id number] Exp 08/2023   rosuvastatin (CRESTOR) 10 MG tablet Take 1 tablet (10 mg total) by mouth daily.   traZODone (DESYREL) 100 MG tablet TAKE 1 TABLET BY MOUTH EVERYDAY AT BEDTIME   No facility-administered encounter medications on file as of 04/15/2023.    Objective:   PHYSICAL EXAMINATION:    VITALS:   There were no vitals filed for this visit.  GEN:  The patient appears stated age and is in NAD.  Neurological examination:  Orientation: The patient is alert  Cranial nerves: There is good facial symmetry with facial hypomimia.  Patient does not speak my native language, so unable to assess fluency/clarity.    I have reviewed and interpreted the following labs independently    Chemistry      Component Value Date/Time   NA 142 07/21/2020 1158   NA 143 02/01/2013 0911   K 4.7 07/21/2020 1158   K 4.0 02/01/2013 0911   CL 105 07/21/2020 1158   CO2 24 07/21/2020 1158   CO2 24 02/01/2013 0911   BUN 14 07/21/2020 1158   BUN 11.5 02/01/2013 0911   CREATININE 0.79 07/21/2020 1158   CREATININE 0.7 02/01/2013 0911      Component Value Date/Time   CALCIUM 9.0 07/21/2020 1158   CALCIUM 9.4 02/01/2013 0911   ALKPHOS 60 12/09/2019 1605   ALKPHOS 80 02/01/2013 0911   AST 13 (L) 12/09/2019 1605   AST 19 02/01/2013 0911   ALT <5 12/09/2019 1605   ALT 14 02/01/2013 0911   BILITOT 0.5 12/09/2019 1605   BILITOT 0.28 02/01/2013 0911       Lab Results   Component Value Date   WBC 5.4 12/09/2019   HGB 10.8 (L) 12/09/2019   HCT 36.7  12/09/2019   MCV 86.4 12/09/2019   PLT 168 12/09/2019    No results found for: "TSH"  Follow up Instructions      -I discussed the assessment and treatment plan with the patient. The patient was provided an opportunity to ask questions and all were answered. The patient agreed with the plan and demonstrated an understanding of the instructions.   The patient was advised to call back or seek an in-person evaluation if the symptoms worsen or if the condition fails to improve as anticipated.    Total time spent on today's visit was *** minutes, including both face-to-face time and nonface-to-face time.  Time included that spent on review of records (prior notes available to me/labs/imaging if pertinent), discussing treatment and goals, answering patient's questions and coordinating care.   Kerin Salen, DO   Cc:  Mila Palmer, MD

## 2023-04-15 ENCOUNTER — Telehealth (INDEPENDENT_AMBULATORY_CARE_PROVIDER_SITE_OTHER): Payer: 59 | Admitting: Neurology

## 2023-04-15 ENCOUNTER — Encounter: Payer: Self-pay | Admitting: Neurology

## 2023-04-15 DIAGNOSIS — G20B2 Parkinson's disease with dyskinesia, with fluctuations: Secondary | ICD-10-CM

## 2023-04-15 DIAGNOSIS — G20A1 Parkinson's disease without dyskinesia, without mention of fluctuations: Secondary | ICD-10-CM

## 2023-04-15 DIAGNOSIS — F02B2 Dementia in other diseases classified elsewhere, moderate, with psychotic disturbance: Secondary | ICD-10-CM | POA: Diagnosis not present

## 2023-04-15 DIAGNOSIS — R441 Visual hallucinations: Secondary | ICD-10-CM | POA: Diagnosis not present

## 2023-04-15 MED ORDER — CARBIDOPA-LEVODOPA 25-100 MG PO TABS
ORAL_TABLET | ORAL | 1 refills | Status: DC
Start: 2023-04-15 — End: 2023-08-22

## 2023-04-15 MED ORDER — CARBIDOPA-LEVODOPA ER 50-200 MG PO TBCR
1.0000 | EXTENDED_RELEASE_TABLET | Freq: Every day | ORAL | 1 refills | Status: DC
Start: 2023-04-15 — End: 2023-10-20

## 2023-05-15 ENCOUNTER — Telehealth: Payer: Self-pay | Admitting: Neurology

## 2023-05-15 DIAGNOSIS — G20B1 Parkinson's disease with dyskinesia, without mention of fluctuations: Secondary | ICD-10-CM | POA: Diagnosis not present

## 2023-05-15 DIAGNOSIS — E46 Unspecified protein-calorie malnutrition: Secondary | ICD-10-CM | POA: Diagnosis not present

## 2023-05-15 DIAGNOSIS — E1169 Type 2 diabetes mellitus with other specified complication: Secondary | ICD-10-CM | POA: Diagnosis not present

## 2023-05-15 NOTE — Telephone Encounter (Signed)
 Patients son spoke with me about the dosages for the Trazadone and they have been giving her 150 mg which has been helping but they are splitting a 100 mg pill and they will need a new RX. Pateitns son said he understood if we could not go any higher than 150 but it is helping her and was hoping to go up to 200 mg. I did explain Dr. Winferd Hatter was out of office and that it is not recommended to go over 150 mg in this age group.

## 2023-05-15 NOTE — Telephone Encounter (Signed)
 Pt's son called in stating the pt has not been sleeping well at night. They have been having her take 150 mg of trazodone  and that seems to be doing better, but still needs improvement. They are wanting to see if Dr. Winferd Hatter is ok with them doing 200 mg? They will also need a refill since they are giving out a higher dosage. Pharmacy is [facility].

## 2023-05-15 NOTE — Telephone Encounter (Signed)
 Pls let them know that it is not recommended to go above 150mg  dose. Dr. Winferd Hatter is out of the office, but in the meantime, would focus on keeping her awake and stimulated during the day so that hopefully she rests better at night. Would try to avoid napping in the day if possible.

## 2023-05-19 ENCOUNTER — Other Ambulatory Visit: Payer: Self-pay

## 2023-05-19 MED ORDER — TRAZODONE HCL 150 MG PO TABS: 150.0000 mg | ORAL_TABLET | Freq: Every day | ORAL | 0 refills | Status: DC

## 2023-05-19 NOTE — Telephone Encounter (Signed)
 Patients son would like the 150 mg sent to the [facility] on [address] please. He stated he agreed with the 200 was probably to much for her. I have sent the approval to you and removed the 100 mg

## 2023-05-21 ENCOUNTER — Other Ambulatory Visit: Payer: Self-pay | Admitting: Neurology

## 2023-05-21 DIAGNOSIS — F02B2 Dementia in other diseases classified elsewhere, moderate, with psychotic disturbance: Secondary | ICD-10-CM

## 2023-05-21 DIAGNOSIS — G20B2 Parkinson's disease with dyskinesia, with fluctuations: Secondary | ICD-10-CM

## 2023-08-15 ENCOUNTER — Other Ambulatory Visit: Payer: Self-pay | Admitting: Neurology

## 2023-08-21 ENCOUNTER — Other Ambulatory Visit: Payer: Self-pay | Admitting: Neurology

## 2023-08-21 DIAGNOSIS — G20B2 Parkinson's disease with dyskinesia, with fluctuations: Secondary | ICD-10-CM

## 2023-10-19 ENCOUNTER — Other Ambulatory Visit: Payer: Self-pay | Admitting: Neurology

## 2023-10-19 DIAGNOSIS — G20A1 Parkinson's disease without dyskinesia, without mention of fluctuations: Secondary | ICD-10-CM

## 2023-11-05 NOTE — Progress Notes (Unsigned)
 Assessment/Plan:   1.  Parkinson's disease             -Take carbidopa /levodopa  25/100, 2 tablets at 8 AM/11 AM/2 PM/5 PM              -continue carbidopa /levodopa  50/200 at 8 PM             -she has mild L sided dyskinesia.  This really has not changed significantly over the years.  Discussed with granddaughter.    2.  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  She has not seen cardiology in several years.             -On aspirin , 81 mg daily, as unable to prove atrial fibrillation thus far    3.  Insomnia             - Continue trazodone  150 mg at night, which sounds like this has been pretty beneficial.     4.  B12 deficiency             -B12 only 200 when drawn at pcp office.               -No longer on injections.   5.  Parkinson's related dementia with hallucinations             -only trialed nuplazid  for 1 week.  They thought maybe caused GI upset but it persisted after d/c.  Wasn't on it long enough to determine efficacy, but granddaughter states that hallucinations are really pretty mild and patient is redirectable.  Patient has caregivers in the home during the day   6.  Follow-up 9 months.  They prefer video visits, but currently Medicare is not allowing.  Perhaps they will in 9 months, but if not, they will follow-up in person.  If so, I am happy to accommodate video visits.   Subjective:   Katherine Novak was seen today in follow up for Parkinsons disease.  My previous records were reviewed prior to todays visit as well as outside records available to me. granddaughter accompanies patient and supplements history.  Translator declined.  She has actually not been seeing for an in person visit for almost 2 years, but has been following up through video visit.  She has not had falls.  She continues to have memory change and hallucinations.  Last visit, her trazodone  was increased to 150 mg at  bedtime.  She does get tired after taking it.  She sleeps better than she did.  She does scream out in the middle of the night.  Appetite seems to come and go but she does drink ensure twice per day.  No choking.  There is a hired aide 8am-5pm mon-thurs and they may or may not come on Saturday.  Constipation can be an issue but they do use benefiber and it helps.  She has urinary frequency.  She does recognize everyone in the family but may confuse the grandkids.  She may see shadowy figures but its not scary but she can be re-directed.    Current prescribed movement disorder medications: Carbidopa /levodopa  25/100, 2 po qid Carbidopa /levodopa  50/200 at bedtime  Trazodone , 150 mg at night   Prior meds:  mirtazapine  (took self off - worked good for sleep when on); Nuplazid  (only took for 1 week and she thought it may  be because GI upset, but that persisted after discontinued and was not on it long enough to determine efficacy)  ALLERGIES:   Allergies  Allergen Reactions   Sulfa Antibiotics Swelling    Angioedema of lips , throat    CURRENT MEDICATIONS:  Outpatient Encounter Medications as of 11/06/2023  Medication Sig   carbidopa -levodopa  (SINEMET  CR) 50-200 MG tablet TAKE 1 TABLET BY MOUTH EVERYDAY AT BEDTIME   pantoprazole  (PROTONIX ) 40 MG tablet Take 40 mg by mouth daily.   traZODone  (DESYREL ) 150 MG tablet TAKE 1 TABLET BY MOUTH AT BEDTIME.   [DISCONTINUED] acetaminophen  (TYLENOL ) 500 MG tablet Take 1 tablet by mouth as needed.   [DISCONTINUED] alendronate  (FOSAMAX ) 70 MG tablet Take 70 mg by mouth every Wednesday.    [DISCONTINUED] aspirin  EC 81 MG tablet Take 81 mg by mouth daily. Swallow whole.   [DISCONTINUED] carbidopa -levodopa  (SINEMET  IR) 25-100 MG tablet Take 2 tablets at 8 AM/11 AM/2 PM/5 PM   [DISCONTINUED] diclofenac Sodium (VOLTAREN) 1 % GEL Apply 4 g topically 4 (four) times daily.   [DISCONTINUED] hydrALAZINE  (APRESOLINE ) 25 MG tablet TAKE 1 TABLET BY MOUTH DAILY AS NEEDED  FOR SBP (TOP NUMBER) > 150 OR DBP (BOTTOM NUMBER) >100   [DISCONTINUED] metoprolol  tartrate (LOPRESSOR ) 25 MG tablet TAKE 1 TABLET BY MOUTH TWICE A DAY   [DISCONTINUED] Pimavanserin Tartrate  (NUPLAZID ) 34 MG CAPS Samples of this drug were given to the patient, quantity 4, Lot Number [id number] Exp 08/2023   [DISCONTINUED] rosuvastatin  (CRESTOR ) 10 MG tablet Take 1 tablet (10 mg total) by mouth daily.   carbidopa -levodopa  (SINEMET  IR) 25-100 MG tablet Take 2 tablets at 8 AM/11 AM/2 PM/5 PM   No facility-administered encounter medications on file as of 11/06/2023.    Objective:   PHYSICAL EXAMINATION:    VITALS:   Vitals:   11/06/23 1517  BP: 124/72  Pulse: 72  SpO2: 99%  Weight: 110 lb 9.6 oz (50.2 kg)       GEN:  The patient appears stated age and is in NAD. HEENT:  Normocephalic, atraumatic.  The mucous membranes are moist. The superficial temporal arteries are without ropiness or tenderness. CV:  RRR Lungs:  CTAB Neck/HEME:  There are no carotid bruits bilaterally.  Neurological examination:  Orientation: The patient is alert  Cranial nerves: There is good facial symmetry with facial hypomimia.  Patient does not speak my native language, so unable to assess fluency/clarity. Soft palate rises symmetrically and there is no tongue deviation. Hearing is intact to conversational tone. Sensation: Sensation is intact to light touch throughout Motor: Strength is at least antigravity x4.  Movement examination: Tone: There is normal tone in the upper and lower extremities Abnormal movements: no tremor.  There is L hemibody dyskinesia Coordination:  There is significant apraxia when asked to do commands of coordination.  Slowness with rapid alternating movements in upper and lower extremities, but not necessarily decremation Gait and Station: The patient pushes off of the chair.  She is given a walker.  She does drag the left leg some and turns en bloc.  She is forward flexed.  When  ambulating out of the office where she does not have a walker (she only brought a cane) she is very short stepped and shuffling.  I have reviewed and interpreted the following labs independently    Chemistry      Component Value Date/Time   NA 142 07/21/2020 1158   NA 143 02/01/2013 0911   K 4.7 07/21/2020 1158   K  4.0 02/01/2013 0911   CL 105 07/21/2020 1158   CO2 24 07/21/2020 1158   CO2 24 02/01/2013 0911   BUN 14 07/21/2020 1158   BUN 11.5 02/01/2013 0911   CREATININE 0.79 07/21/2020 1158   CREATININE 0.7 02/01/2013 0911      Component Value Date/Time   CALCIUM  9.0 07/21/2020 1158   CALCIUM  9.4 02/01/2013 0911   ALKPHOS 60 12/09/2019 1605   ALKPHOS 80 02/01/2013 0911   AST 13 (L) 12/09/2019 1605   AST 19 02/01/2013 0911   ALT <5 12/09/2019 1605   ALT 14 02/01/2013 0911   BILITOT 0.5 12/09/2019 1605   BILITOT 0.28 02/01/2013 0911       Lab Results  Component Value Date   WBC 5.4 12/09/2019   HGB 10.8 (L) 12/09/2019   HCT 36.7 12/09/2019   MCV 86.4 12/09/2019   PLT 168 12/09/2019    No results found for: TSH   Total time spent on today's visit was 30 minutes, including both face-to-face time and nonface-to-face time.  Time included that spent on review of records (prior notes available to me/labs/imaging if pertinent), discussing treatment and goals, answering patient's questions and coordinating care.  Cc:  Espinoza, Alejandra, DO

## 2023-11-06 ENCOUNTER — Ambulatory Visit (INDEPENDENT_AMBULATORY_CARE_PROVIDER_SITE_OTHER): Admitting: Neurology

## 2023-11-06 VITALS — BP 124/72 | HR 72 | Wt 110.6 lb

## 2023-11-06 DIAGNOSIS — G20A1 Parkinson's disease without dyskinesia, without mention of fluctuations: Secondary | ICD-10-CM

## 2023-11-06 DIAGNOSIS — G20B2 Parkinson's disease with dyskinesia, with fluctuations: Secondary | ICD-10-CM | POA: Diagnosis not present

## 2023-11-06 DIAGNOSIS — F02B2 Dementia in other diseases classified elsewhere, moderate, with psychotic disturbance: Secondary | ICD-10-CM | POA: Diagnosis not present

## 2023-11-06 MED ORDER — CARBIDOPA-LEVODOPA 25-100 MG PO TABS: ORAL_TABLET | ORAL | 2 refills | Status: AC

## 2023-11-16 ENCOUNTER — Other Ambulatory Visit: Payer: Self-pay | Admitting: Neurology

## 2024-01-20 ENCOUNTER — Other Ambulatory Visit: Payer: Self-pay | Admitting: Neurology

## 2024-01-20 DIAGNOSIS — G20A1 Parkinson's disease without dyskinesia, without mention of fluctuations: Secondary | ICD-10-CM
